# Patient Record
Sex: Male | Born: 1940 | Race: White | Hispanic: No | Marital: Single | Smoking: Former smoker
Health system: Southern US, Community
[De-identification: ages and names within clinical notes are randomized; demographics above are authoritative.]

## PROBLEM LIST (undated history)

## (undated) DIAGNOSIS — M169 Osteoarthritis of hip, unspecified: Secondary | ICD-10-CM

## (undated) DIAGNOSIS — M179 Osteoarthritis of knee, unspecified: Secondary | ICD-10-CM

## (undated) DIAGNOSIS — K219 Gastro-esophageal reflux disease without esophagitis: Secondary | ICD-10-CM

## (undated) DIAGNOSIS — Z9289 Personal history of other medical treatment: Secondary | ICD-10-CM

## (undated) DIAGNOSIS — M1610 Unilateral primary osteoarthritis, unspecified hip: Secondary | ICD-10-CM

## (undated) DIAGNOSIS — N183 Chronic kidney disease, stage 3 unspecified: Secondary | ICD-10-CM

## (undated) DIAGNOSIS — M1710 Unilateral primary osteoarthritis, unspecified knee: Secondary | ICD-10-CM

## (undated) DIAGNOSIS — N400 Enlarged prostate without lower urinary tract symptoms: Secondary | ICD-10-CM

## (undated) DIAGNOSIS — I10 Essential (primary) hypertension: Secondary | ICD-10-CM

## (undated) DIAGNOSIS — K812 Acute cholecystitis with chronic cholecystitis: Secondary | ICD-10-CM

## (undated) DIAGNOSIS — I2510 Atherosclerotic heart disease of native coronary artery without angina pectoris: Secondary | ICD-10-CM

## (undated) DIAGNOSIS — J449 Chronic obstructive pulmonary disease, unspecified: Secondary | ICD-10-CM

## (undated) DIAGNOSIS — G43909 Migraine, unspecified, not intractable, without status migrainosus: Secondary | ICD-10-CM

## (undated) DIAGNOSIS — N259 Disorder resulting from impaired renal tubular function, unspecified: Secondary | ICD-10-CM

## (undated) HISTORY — DX: Atherosclerotic heart disease of native coronary artery without angina pectoris: I25.10

## (undated) HISTORY — DX: Disorder resulting from impaired renal tubular function, unspecified: N25.9

## (undated) HISTORY — DX: Personal history of other medical treatment: Z92.89

## (undated) HISTORY — DX: Benign prostatic hyperplasia without lower urinary tract symptoms: N40.0

## (undated) HISTORY — DX: Osteoarthritis of hip, unspecified: M16.9

## (undated) HISTORY — DX: Migraine, unspecified, not intractable, without status migrainosus: G43.909

## (undated) HISTORY — DX: Chronic obstructive pulmonary disease, unspecified: J44.9

## (undated) HISTORY — DX: Essential (primary) hypertension: I10

## (undated) HISTORY — DX: Gastro-esophageal reflux disease without esophagitis: K21.9

## (undated) HISTORY — PX: TONSILLECTOMY: SHX5217

## (undated) HISTORY — DX: Chronic kidney disease, stage 3 (moderate): N18.3

## (undated) HISTORY — DX: Unilateral primary osteoarthritis, unspecified hip: M16.10

## (undated) HISTORY — DX: Acute cholecystitis with chronic cholecystitis: K81.2

## (undated) HISTORY — DX: Chronic kidney disease, stage 3 unspecified: N18.30

## (undated) HISTORY — DX: Unilateral primary osteoarthritis, unspecified knee: M17.10

## (undated) HISTORY — DX: Osteoarthritis of knee, unspecified: M17.9

## (undated) SURGERY — LAPAROSCOPIC CHOLECYSTECTOMY WITH INTRAOPERATIVE CHOLANGIOGRAM
Anesthesia: General

---
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
## 2008-11-10 ENCOUNTER — Encounter: Payer: Self-pay | Admitting: Internal Medicine

## 2008-11-10 LAB — CONVERTED CEMR LAB
ALT: 19 units/L
AST: 18 units/L
BUN: 19 mg/dL
Basophils Relative: 1 %
Calcium: 9.6 mg/dL
Chloride: 104 meq/L
Glucose, Bld: 94 mg/dL
HCT: 49.8 %
Hemoglobin: 17 g/dL
MCV: 88.1 fL
Platelets: 197 10*3/uL
Potassium: 4.5 meq/L
RDW: 14.3 %

## 2008-11-16 ENCOUNTER — Inpatient Hospital Stay (HOSPITAL_COMMUNITY): Admission: RE | Admit: 2008-11-16 | Discharge: 2008-11-21 | Payer: Self-pay | Admitting: Orthopedic Surgery

## 2008-11-16 DIAGNOSIS — Z96659 Presence of unspecified artificial knee joint: Secondary | ICD-10-CM | POA: Insufficient documentation

## 2008-11-19 ENCOUNTER — Encounter: Payer: Self-pay | Admitting: Internal Medicine

## 2008-11-19 HISTORY — PX: TOTAL HIP ARTHROPLASTY: SHX124

## 2008-11-19 LAB — CONVERTED CEMR LAB
BUN: 35 mg/dL
Glucose, Bld: 128 mg/dL
MCV: 88.6 fL
Platelets: 167 10*3/uL
Potassium: 5.1 meq/L
RDW: 14.5 %

## 2009-07-07 ENCOUNTER — Ambulatory Visit: Payer: Self-pay | Admitting: Internal Medicine

## 2009-07-07 DIAGNOSIS — G43909 Migraine, unspecified, not intractable, without status migrainosus: Secondary | ICD-10-CM | POA: Insufficient documentation

## 2009-07-07 DIAGNOSIS — M169 Osteoarthritis of hip, unspecified: Secondary | ICD-10-CM | POA: Insufficient documentation

## 2009-07-07 DIAGNOSIS — Z87442 Personal history of urinary calculi: Secondary | ICD-10-CM | POA: Insufficient documentation

## 2009-07-07 DIAGNOSIS — J189 Pneumonia, unspecified organism: Secondary | ICD-10-CM | POA: Insufficient documentation

## 2009-07-07 DIAGNOSIS — R519 Headache, unspecified: Secondary | ICD-10-CM | POA: Insufficient documentation

## 2009-07-07 DIAGNOSIS — K219 Gastro-esophageal reflux disease without esophagitis: Secondary | ICD-10-CM | POA: Insufficient documentation

## 2009-07-07 DIAGNOSIS — M129 Arthropathy, unspecified: Secondary | ICD-10-CM | POA: Insufficient documentation

## 2009-07-07 DIAGNOSIS — M1610 Unilateral primary osteoarthritis, unspecified hip: Secondary | ICD-10-CM | POA: Insufficient documentation

## 2009-07-07 DIAGNOSIS — I10 Essential (primary) hypertension: Secondary | ICD-10-CM | POA: Insufficient documentation

## 2009-07-07 DIAGNOSIS — N259 Disorder resulting from impaired renal tubular function, unspecified: Secondary | ICD-10-CM | POA: Insufficient documentation

## 2009-07-07 DIAGNOSIS — N400 Enlarged prostate without lower urinary tract symptoms: Secondary | ICD-10-CM | POA: Insufficient documentation

## 2009-07-07 DIAGNOSIS — R51 Headache: Secondary | ICD-10-CM | POA: Insufficient documentation

## 2009-07-07 LAB — CONVERTED CEMR LAB
ALT: 13 units/L (ref 0–53)
AST: 16 units/L (ref 0–37)
Alkaline Phosphatase: 48 units/L (ref 39–117)
BUN: 25 mg/dL — ABNORMAL HIGH (ref 6–23)
Basophils Absolute: 0.1 10*3/uL (ref 0.0–0.1)
Basophils Relative: 0.7 % (ref 0.0–3.0)
Chloride: 108 meq/L (ref 96–112)
Eosinophils Absolute: 0.4 10*3/uL (ref 0.0–0.7)
Glucose, Bld: 85 mg/dL (ref 70–99)
HCT: 45.3 % (ref 39.0–52.0)
Lymphs Abs: 3 10*3/uL (ref 0.7–4.0)
MCHC: 33.4 g/dL (ref 30.0–36.0)
MCV: 93.7 fL (ref 78.0–100.0)
Monocytes Relative: 6.1 % (ref 3.0–12.0)
Neutro Abs: 3.6 10*3/uL (ref 1.4–7.7)
Platelets: 189 10*3/uL (ref 150.0–400.0)
RDW: 13.3 % (ref 11.5–14.6)
Sodium: 140 meq/L (ref 135–145)
Total Protein: 6.5 g/dL (ref 6.0–8.3)

## 2009-07-22 HISTORY — PX: REPLACEMENT TOTAL KNEE BILATERAL: SUR1225

## 2009-08-10 ENCOUNTER — Encounter: Payer: Self-pay | Admitting: Internal Medicine

## 2009-08-16 ENCOUNTER — Inpatient Hospital Stay (HOSPITAL_COMMUNITY): Admission: RE | Admit: 2009-08-16 | Discharge: 2009-08-18 | Payer: Self-pay | Admitting: Orthopedic Surgery

## 2009-08-16 ENCOUNTER — Encounter: Payer: Self-pay | Admitting: Internal Medicine

## 2009-08-31 ENCOUNTER — Encounter: Payer: Self-pay | Admitting: Internal Medicine

## 2009-09-21 ENCOUNTER — Encounter: Payer: Self-pay | Admitting: Internal Medicine

## 2009-11-02 ENCOUNTER — Encounter: Payer: Self-pay | Admitting: Internal Medicine

## 2009-11-03 ENCOUNTER — Encounter: Payer: Self-pay | Admitting: Internal Medicine

## 2009-11-03 ENCOUNTER — Inpatient Hospital Stay (HOSPITAL_COMMUNITY): Admission: RE | Admit: 2009-11-03 | Discharge: 2009-11-06 | Payer: Self-pay | Admitting: Orthopedic Surgery

## 2009-11-20 ENCOUNTER — Encounter: Payer: Self-pay | Admitting: Internal Medicine

## 2010-02-06 ENCOUNTER — Encounter: Payer: Self-pay | Admitting: Internal Medicine

## 2010-03-27 ENCOUNTER — Ambulatory Visit: Payer: Self-pay | Admitting: Internal Medicine

## 2010-03-27 DIAGNOSIS — R509 Fever, unspecified: Secondary | ICD-10-CM | POA: Insufficient documentation

## 2010-03-27 LAB — CONVERTED CEMR LAB
Basophils Absolute: 0.1 10*3/uL (ref 0.0–0.1)
Eosinophils Relative: 3.1 % (ref 0.0–5.0)
HCT: 45 % (ref 39.0–52.0)
Hemoglobin: 15.2 g/dL (ref 13.0–17.0)
Leukocytes, UA: NEGATIVE
Lymphocytes Relative: 20.2 % (ref 12.0–46.0)
Monocytes Relative: 5.6 % (ref 3.0–12.0)
Neutrophils Relative %: 69.8 % (ref 43.0–77.0)
Sed Rate: 12 mm/hr (ref 0–22)
Specific Gravity, Urine: >=1.03 (ref 1.000–1.030)
WBC: 10 10*3/uL (ref 4.5–10.5)
pH: 5 (ref 5.0–8.0)

## 2010-08-21 NOTE — Letter (Signed)
Summary: [facility]  [facility]   Imported By: Lennie Odor 02/12/2010 14:50:04  _____________________________________________________________________  External Attachment:    Type:   Image     Comment:   External Document

## 2010-08-21 NOTE — Letter (Signed)
Summary: [facility]  [facility]   Imported By: Sherian Rein 11/27/2009 09:51:37  _____________________________________________________________________  External Attachment:    Type:   Image     Comment:   External Document

## 2010-08-21 NOTE — Letter (Signed)
Summary: [facility]  [facility]   Imported By: Lester [place] 09/09/2009 08:16:50  _____________________________________________________________________  External Attachment:    Type:   Image     Comment:   External Document

## 2010-08-21 NOTE — Letter (Signed)
Summary: [facility]  [facility]   Imported By: Sherian Rein 09/29/2009 07:29:18  _____________________________________________________________________  External Attachment:    Type:   Image     Comment:   External Document

## 2010-08-21 NOTE — Op Note (Signed)
Summary: [facility]  [facility]   Imported By: Lester Curtis 09/09/2009 08:15:45  _____________________________________________________________________  External Attachment:    Type:   Image     Comment:   External Document

## 2010-08-21 NOTE — Letter (Signed)
Summary: [facility]   Imported By: Sherian Rein 08/18/2009 11:59:54  _____________________________________________________________________  External Attachment:    Type:   Image     Comment:   External Document

## 2010-08-21 NOTE — Letter (Signed)
Summary: [facility]  [facility]   Imported By: Lester [place] 11/15/2009 10:55:16  _____________________________________________________________________  External Attachment:    Type:   Image     Comment:   External Document

## 2010-08-21 NOTE — Letter (Signed)
Summary: [facility]  [facility]   Imported By: Sherian Rein 11/27/2009 09:52:42  _____________________________________________________________________  External Attachment:    Type:   Image     Comment:   External Document

## 2010-08-21 NOTE — Assessment & Plan Note (Signed)
Summary: fever over the weekend/congestion/cd   Vital Signs:  Patient profile:   70 year old male Height:      72 inches Weight:      216 pounds O2 Sat:      97 % Temp:     99.9 degrees F oral Pulse rate:   86 / minute Pulse rhythm:   regular BP sitting:   115 / 60  (left arm) Cuff size:   large  Vitals Entered By: patty berrocal/intern CC: fever, congestion Is Patient Diabetic? No Pain Assessment Patient in pain? no      Comments pt currently taking antibotic prescribed by his dentist unsure of name. has 4-5 days left   Primary Care Provider:  Newt Lukes MD  CC:  fever and congestion.  History of Present Illness: here with c/o fever onset 4 days ago -  assoc with chest congestion, hoarseness and dry cough + night sweats no weight changes, HA or rash -  symptoms improved since starting abx from dentist for sore on gums wants to be sure no "bacterial infx in his joints from surgery"  reviewed other chronic med issues:   HTN  -  reports compliance with ongoing medical treatment and no changes in medication dose or frequency. denies adverse side effects related to current therapy.   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 1 Puff Two Times A Day 2)  Coumadin 5 Mg Tabs (Warfarin Sodium) .... Use As Directed 3)  Ferrous Sulfate 325 (65 Fe) Mg Tbec (Ferrous Sulfate) .... Take 1 Two Times A Day 4)  Tylenol Extra Strength 500 Mg Tabs (Acetaminophen) .... Use As Needed 5)  Ambien 5 Mg Tabs (Zolpidem Tartrate) .... Use As Needed 6)  Methocarbamol 500 Mg Tabs (Methocarbamol) .... Take 1 Q 6 Hours As Needed For Spasm 7)  Lisinopril-Hydrochlorothiazide 20-25  Mg Tabs (Lisinopril-Hydrochlorothiazide) .... Take 1 By Mouth Qd 8)  Meloxicam 15 Mg Tabs (Meloxicam) .... Take 1 By Mouth Qd 9)  Tramadol Hcl 50 Mg Tabs (Tramadol Hcl) .... Take 1-2 Q 6 Hours Prn 10)  Aspirin 325 Mg Tabs (Aspirin) .... Take 1 By Mouth Qd 11)  Ibuprofen 200 Mg Tabs (Ibuprofen) .... Take As Needed 12)  Flaxseed Oil  Oil (Flaxseed (Linseed)) .... Two Times A Day 13)  Oxycodone-Acetaminophen 5-325 Mg Tabs (Oxycodone-Acetaminophen) .... As Needed 14)  Hydrocodone-Acetaminophen 5-500 Mg Tabs (Hydrocodone-Acetaminophen) .... Prn  Allergies (verified): No Known Drug Allergies  Past History:  Past Medical History: Hypertension Benign prostatic hypertrophy Renal insufficiency GERD kidney stones, hx  MD roster: ortho - [facility]  Review of Systems       The patient complains of fever and hoarseness.  The patient denies chest pain, headaches, and hemoptysis.         c/o myalgias and fatigue - sleeping 15-16h/d x 3 days  Physical Exam  General:  overweight-appearing.  alert, well-developed, well-nourished, and cooperative to examination.    Eyes:  vision grossly intact; pupils equal, round and reactive to light.  conjunctiva and lids normal.   wears glasses Ears:  normal pinnae bilaterally, without erythema, swelling, or tenderness to palpation. TMs clear, without effusion, or  cerumen impaction. Hearing grossly normal bilaterally with hearing aides Mouth:  teeth and gums in good repair; mucous membranes moist, without lesions or ulcers. oropharynx clear without exudate, mild erythema.  Lungs:  normal respiratory effort, no intercostal retractions or use of accessory muscles; normal breath sounds bilaterally - no crackles and no wheezes.    Heart:  normal rate, regular rhythm, no murmur, and no rub. BLE without edema.  Msk:  no effusions or redness of B kness, approp warmth to touch Skin:  no rashes, vesicles, ulcers, or erythema. No nodules or irregularity to palpation.     Impression & Recommendations:  Problem # 1:  FEVER UNSPECIFIED (ICD-780.60)  suspect viral infx notes partial response to abx tx from dentist  -?amox-  call drug store to verify abx - then plan change to broader sectrum coverage given LGF check labs now and CXR -   Orders: TLB-CBC Platelet - w/Differential (85025-CBCD) TLB-Sedimentation Rate (ESR) (85652-ESR) TLB-Udip w/ Micro (81001-URINE) T-2 View CXR (71020TC)  Problem # 2:  ARTHRITIS (ICD-716.90)  s/p bilat TKR 07/2009 - also R THR hx  Problem # 3:  HYPERTENSION, BORDERLINE (ICD-401.9)  His updated medication list for this problem includes:    Lisinopril-hydrochlorothiazide 20-25 Mg Tabs (Lisinopril-hydrochlorothiazide) .Marland Kitchen... Take 1 by mouth qd  BP today: 115/60 Prior BP: 120/76 (07/07/2009)  Labs Reviewed: K+: 5.3 (07/07/2009) Creat: : 2.0 (07/07/2009)     Complete Medication List: 1)  Advair Diskus 250-50 Mcg/dose Aepb (Fluticasone-salmeterol) .Marland Kitchen.. 1 puff two times a day 2)  Coumadin 5 Mg Tabs (Warfarin sodium) .... Use as directed 3)  Ferrous Sulfate 325 (65 Fe) Mg Tbec (Ferrous sulfate) .... Take 1 two times a day 4)  Tylenol Extra Strength 500 Mg Tabs (Acetaminophen) .... Use as needed 5)  Ambien 5 Mg Tabs (Zolpidem tartrate) .... Use as needed 6)  Methocarbamol 500 Mg Tabs (Methocarbamol) .... Take 1 q 6 hours as needed for spasm 7)  Lisinopril-hydrochlorothiazide 20-25 Mg Tabs (Lisinopril-hydrochlorothiazide) .... Take 1 by mouth qd 8)  Meloxicam 15 Mg Tabs (Meloxicam) .... Take 1 by mouth qd 9)  Tramadol Hcl 50 Mg Tabs (Tramadol hcl) .... Take 1-2 q 6 hours prn 10)  Aspirin 325 Mg Tabs (Aspirin) .... Take 1 by mouth qd 11)  Ibuprofen 200 Mg Tabs (Ibuprofen) .... Take as needed 12)  Flaxseed Oil Oil (Flaxseed (linseed)) .... Two times a day 13)  Oxycodone-acetaminophen 5-325 Mg Tabs (Oxycodone-acetaminophen) .... As needed 14)  Hydrocodone-acetaminophen 5-500 Mg Tabs (Hydrocodone-acetaminophen) ....  Prn 15)  Levaquin 500 Mg Tabs (Levofloxacin) .Marland Kitchen.. 1 by mouth once daily x 7 days  Patient Instructions: 1)  it was good to see you today. 2)  test(s) ordered today - your results will be posted on the phone tree for review in 48-72 hours from the time of test completion; call [phone] and enter your 9 digit MRN (listed above on this page, just below your name); if any changes need to be made or there are abnormal results, you will be contacted directly. 3)  will change your antibiotic to braoder coverage - suspect Levaquin will be prescribed after i review current treatment with your pharmacy Prescriptions: LEVAQUIN 500 MG TABS (LEVOFLOXACIN) 1 by mouth once daily x 7 days  #7 x 0   Entered and Authorized by:   Newt Lukes MD   Signed by:   Newt Lukes MD on 03/27/2010   Method used:   Electronically to        [id number] (retail)       [address]       [place], [place]  [address]       Ph: [phone]       Fax: [phone]   RxID:   [id number]

## 2010-10-07 LAB — CBC
HCT: 51.6 % (ref 39.0–52.0)
Hemoglobin: 12.9 g/dL — ABNORMAL LOW (ref 13.0–17.0)
Hemoglobin: 17.2 g/dL — ABNORMAL HIGH (ref 13.0–17.0)
MCHC: 33.4 g/dL (ref 30.0–36.0)
MCV: 92.6 fL (ref 78.0–100.0)
MCV: 93.1 fL (ref 78.0–100.0)
RBC: 3.88 MIL/uL — ABNORMAL LOW (ref 4.22–5.81)
RBC: 4.05 MIL/uL — ABNORMAL LOW (ref 4.22–5.81)
RDW: 13.6 % (ref 11.5–15.5)
WBC: 10.9 10*3/uL — ABNORMAL HIGH (ref 4.0–10.5)
WBC: 8.3 10*3/uL (ref 4.0–10.5)

## 2010-10-07 LAB — BASIC METABOLIC PANEL
BUN: 23 mg/dL (ref 6–23)
Calcium: 8.3 mg/dL — ABNORMAL LOW (ref 8.4–10.5)
Chloride: 103 mEq/L (ref 96–112)
Creatinine, Ser: 1.62 mg/dL — ABNORMAL HIGH (ref 0.4–1.5)
GFR calc Af Amer: 47 mL/min — ABNORMAL LOW (ref >60)
GFR calc non Af Amer: 39 mL/min — ABNORMAL LOW (ref >60)
Glucose, Bld: 95 mg/dL (ref 70–99)
Potassium: 3.8 mEq/L (ref 3.5–5.1)
Potassium: 4.2 mEq/L (ref 3.5–5.1)
Sodium: 136 mEq/L (ref 135–145)

## 2010-10-07 LAB — URINALYSIS, ROUTINE W REFLEX MICROSCOPIC
Bilirubin Urine: NEGATIVE
Glucose, UA: NEGATIVE mg/dL
Ketones, ur: NEGATIVE mg/dL
Protein, ur: NEGATIVE mg/dL

## 2010-10-07 LAB — DIFFERENTIAL
Basophils Relative: 1 % (ref 0–1)
Eosinophils Absolute: 0.3 10*3/uL (ref 0.0–0.7)
Eosinophils Relative: 3 % (ref 0–5)

## 2010-10-07 LAB — COMPREHENSIVE METABOLIC PANEL
ALT: 17 U/L (ref 0–53)
Alkaline Phosphatase: 57 U/L (ref 39–117)
CO2: 27 mEq/L (ref 19–32)
Chloride: 105 mEq/L (ref 96–112)
Glucose, Bld: 84 mg/dL (ref 70–99)
Potassium: 5.1 mEq/L (ref 3.5–5.1)
Sodium: 139 mEq/L (ref 135–145)

## 2010-10-07 LAB — PROTIME-INR
INR: 0.99 (ref 0.00–1.49)
Prothrombin Time: 15 seconds (ref 11.6–15.2)

## 2010-10-07 LAB — TYPE AND SCREEN: Antibody Screen: NEGATIVE

## 2010-10-09 LAB — CBC
HCT: 34 % — ABNORMAL LOW (ref 39.0–52.0)
HCT: 35.2 % — ABNORMAL LOW (ref 39.0–52.0)
Hemoglobin: 11.3 g/dL — ABNORMAL LOW (ref 13.0–17.0)
Hemoglobin: 11.8 g/dL — ABNORMAL LOW (ref 13.0–17.0)
Hemoglobin: 12.2 g/dL — ABNORMAL LOW (ref 13.0–17.0)
Platelets: 146 10*3/uL — ABNORMAL LOW (ref 150–400)
RBC: 3.68 MIL/uL — ABNORMAL LOW (ref 4.22–5.81)
RBC: 3.77 MIL/uL — ABNORMAL LOW (ref 4.22–5.81)
RDW: 15.2 % (ref 11.5–15.5)
WBC: 11.3 10*3/uL — ABNORMAL HIGH (ref 4.0–10.5)
WBC: 11.6 10*3/uL — ABNORMAL HIGH (ref 4.0–10.5)
WBC: 8.1 10*3/uL (ref 4.0–10.5)

## 2010-10-09 LAB — BASIC METABOLIC PANEL
CO2: 28 mEq/L (ref 19–32)
Calcium: 8.5 mg/dL (ref 8.4–10.5)
Calcium: 9 mg/dL (ref 8.4–10.5)
Creatinine, Ser: 1.7 mg/dL — ABNORMAL HIGH (ref 0.4–1.5)
GFR calc Af Amer: 49 mL/min — ABNORMAL LOW (ref >60)
GFR calc Af Amer: 49 mL/min — ABNORMAL LOW (ref >60)
GFR calc non Af Amer: 40 mL/min — ABNORMAL LOW (ref >60)
Sodium: 136 mEq/L (ref 135–145)
Sodium: 138 mEq/L (ref 135–145)

## 2010-10-09 LAB — TYPE AND SCREEN: ABO/RH(D): O POS

## 2010-10-09 LAB — PROTIME-INR
INR: 1.21 (ref 0.00–1.49)
INR: 1.28 (ref 0.00–1.49)
INR: 1.52 — ABNORMAL HIGH (ref 0.00–1.49)
Prothrombin Time: 15.2 seconds (ref 11.6–15.2)

## 2010-10-10 LAB — COMPREHENSIVE METABOLIC PANEL
ALT: 13 U/L (ref 0–53)
Alkaline Phosphatase: 55 U/L (ref 39–117)
Chloride: 105 mEq/L (ref 96–112)
Glucose, Bld: 127 mg/dL — ABNORMAL HIGH (ref 70–99)
Potassium: 5 mEq/L (ref 3.5–5.1)
Sodium: 144 mEq/L (ref 135–145)
Total Bilirubin: 0.5 mg/dL (ref 0.3–1.2)
Total Protein: 6.6 g/dL (ref 6.0–8.3)

## 2010-10-10 LAB — DIFFERENTIAL
Basophils Relative: 1 % (ref 0–1)
Eosinophils Absolute: 0.4 10*3/uL (ref 0.0–0.7)
Monocytes Absolute: 0.4 10*3/uL (ref 0.1–1.0)
Monocytes Relative: 5 % (ref 3–12)
Neutrophils Relative %: 53 % (ref 43–77)

## 2010-10-10 LAB — CBC
Hemoglobin: 15.2 g/dL (ref 13.0–17.0)
RBC: 4.98 MIL/uL (ref 4.22–5.81)
RDW: 15.2 % (ref 11.5–15.5)
WBC: 7.3 10*3/uL (ref 4.0–10.5)

## 2010-10-10 LAB — URINALYSIS, ROUTINE W REFLEX MICROSCOPIC
Glucose, UA: NEGATIVE mg/dL
Ketones, ur: NEGATIVE mg/dL
Nitrite: NEGATIVE
Specific Gravity, Urine: 1.026 (ref 1.005–1.030)
pH: 5 (ref 5.0–8.0)

## 2010-10-10 LAB — PROTIME-INR: INR: 1.01 (ref 0.00–1.49)

## 2010-10-30 LAB — CBC
HCT: 34.1 % — ABNORMAL LOW (ref 39.0–52.0)
MCHC: 34.1 g/dL (ref 30.0–36.0)
MCV: 89.5 fL (ref 78.0–100.0)
Platelets: 133 10*3/uL — ABNORMAL LOW (ref 150–400)
RDW: 14.4 % (ref 11.5–15.5)

## 2010-10-30 LAB — BASIC METABOLIC PANEL
BUN: 18 mg/dL (ref 6–23)
Chloride: 103 mEq/L (ref 96–112)
Glucose, Bld: 114 mg/dL — ABNORMAL HIGH (ref 70–99)
Potassium: 4.5 mEq/L (ref 3.5–5.1)

## 2010-10-31 LAB — COMPREHENSIVE METABOLIC PANEL
AST: 18 U/L (ref 0–37)
BUN: 19 mg/dL (ref 6–23)
CO2: 26 mEq/L (ref 19–32)
Chloride: 104 mEq/L (ref 96–112)
Creatinine, Ser: 1.44 mg/dL (ref 0.4–1.5)
GFR calc Af Amer: 59 mL/min — ABNORMAL LOW (ref >60)
GFR calc non Af Amer: 49 mL/min — ABNORMAL LOW (ref >60)
Total Bilirubin: 1 mg/dL (ref 0.3–1.2)

## 2010-10-31 LAB — URINALYSIS, ROUTINE W REFLEX MICROSCOPIC
Bilirubin Urine: NEGATIVE
Glucose, UA: NEGATIVE mg/dL
Hgb urine dipstick: NEGATIVE
Ketones, ur: NEGATIVE mg/dL
Protein, ur: NEGATIVE mg/dL

## 2010-10-31 LAB — CBC
HCT: 33.6 % — ABNORMAL LOW (ref 39.0–52.0)
HCT: 36.3 % — ABNORMAL LOW (ref 39.0–52.0)
HCT: 49.8 % (ref 39.0–52.0)
Hemoglobin: 11.7 g/dL — ABNORMAL LOW (ref 13.0–17.0)
Hemoglobin: 12.5 g/dL — ABNORMAL LOW (ref 13.0–17.0)
MCHC: 34.9 g/dL (ref 30.0–36.0)
MCV: 88.1 fL (ref 78.0–100.0)
MCV: 88.6 fL (ref 78.0–100.0)
MCV: 89 fL (ref 78.0–100.0)
Platelets: 167 10*3/uL (ref 150–400)
RBC: 3.78 MIL/uL — ABNORMAL LOW (ref 4.22–5.81)
RBC: 4.1 MIL/uL — ABNORMAL LOW (ref 4.22–5.81)
RBC: 5.65 MIL/uL (ref 4.22–5.81)
RDW: 14.2 % (ref 11.5–15.5)
WBC: 13.4 10*3/uL — ABNORMAL HIGH (ref 4.0–10.5)
WBC: 9.1 10*3/uL (ref 4.0–10.5)

## 2010-10-31 LAB — ABO/RH: ABO/RH(D): O POS

## 2010-10-31 LAB — BASIC METABOLIC PANEL
BUN: 35 mg/dL — ABNORMAL HIGH (ref 6–23)
CO2: 28 mEq/L (ref 19–32)
Chloride: 100 mEq/L (ref 96–112)
Chloride: 105 mEq/L (ref 96–112)
GFR calc Af Amer: 55 mL/min — ABNORMAL LOW (ref >60)
Glucose, Bld: 117 mg/dL — ABNORMAL HIGH (ref 70–99)
Potassium: 4.3 mEq/L (ref 3.5–5.1)
Potassium: 5.1 mEq/L (ref 3.5–5.1)
Sodium: 136 mEq/L (ref 135–145)
Sodium: 137 mEq/L (ref 135–145)

## 2010-10-31 LAB — DIFFERENTIAL
Basophils Absolute: 0.1 10*3/uL (ref 0.0–0.1)
Basophils Relative: 1 % (ref 0–1)
Eosinophils Relative: 4 % (ref 0–5)
Lymphocytes Relative: 32 % (ref 12–46)

## 2010-10-31 LAB — PROTIME-INR: Prothrombin Time: 14.1 seconds (ref 11.6–15.2)

## 2010-11-05 ENCOUNTER — Other Ambulatory Visit: Payer: Self-pay | Admitting: Internal Medicine

## 2010-12-04 NOTE — Discharge Summary (Signed)
NAME:  Preston Boyd, BULMAN NO.:  [id number]   MEDICAL RECORD NO.:  [id number]          PATIENT TYPE:  INP   LOCATION:  5019                         FACILITY:  [facility]   PHYSICIAN:  Harvie Junior, M.D.   DATE OF BIRTH:  [birth year]   DATE OF ADMISSION:  11/16/2008  DATE OF DISCHARGE:  11/21/2008                               DISCHARGE SUMMARY   ADMISSION DIAGNOSIS:  1. End-stage degenerative joint disease, right hip.  2. History of kidney stones.  3. History of pneumonia, 07/2007 with residual intermittent shortness      of breath.  4. Benign prostatic hypertrophy.  5. Borderline hypertension.   DISCHARGE DIAGNOSIS:  1. End-stage degenerative joint disease, right hip.  2. History of kidney stones.  3. History of pneumonia, 07/2007 with residual intermittent shortness      of breath.  4. Benign prostatic hypertrophy.  5. Borderline hypertension.  6. Renal insufficiency.   PROCEDURES:  Right total hip arthroplasty, Jodi Geralds, M.D.   CONSULTATIONS:  None.   BRIEF HISTORY:  Mr. Preston Boyd is a 70 year old male who has a long history  of right hip pain.  X-rays show end-stage degenerative joint disease of  the right hip.  He got no relief with conservative treatment including  modification of his activity and medication and despite this continued  to have pain with ambulation and night pain.  He is admitted for right  total hip arthroplasty as he failed exhaustive conservative treatment.   PERTINENT LABORATORY STUDIES:  Chest x-ray on November 10, 2008 showed no  active lung disease.  EKG on November 10, 2008 showed normal sinus rhythm  with nonspecific T-wave abnormality, abnormal EKG.  The patient's  hemoglobin on postop day number one was 12.5, hematocrit 36.3.  This was  followed on a daily basis and on Nov 19, 2008, his hemoglobin was 11.6  with hematocrit of 34.1.  His BMET showed an elevated creatinine of 2.10  on postop day number one with a BUN of 35.  On postop  day number two,  his creatinine was 1.54 with a BUN of 28.  On postop day number three,  May 1,2010, his creatinine was 1.36 with a BUN of 18.  His pro time on  admission was 15.8 seconds with an INR of 1.2.  On the day of discharge,  Nov 21, 2008, his pro time was 25.7 seconds and INR 2.2.  X-ray  postoperatively of the pelvis and right hip showed good position of his  total hip prosthesis without complications.   HOSPITAL COURSE:  The patient was admitted to the hospital and brought  to the operating room where he underwent right total hip arthroplasty  that is well described in Dr. Luiz Blare' operative note.  Preoperatively,  he was given a gram of Ancef and gentamicin.  Postoperatively, he was  given 24 hours of IV Ancef.  A PCA morphine pump was used for pain  control.  Physical therapy got him up walking, weightbearing as  tolerated on the right with hip precautions, ambulating with a walker.  A Foley catheter that had been placed  at the time of surgery was intact.  He had a temperature of 100.  Blood pressure was slightly decreased at  90/60, pulse 97, respirations 16.  He was given a couple of rounds of  500 cc of fluid boluses.  His hemoglobin was 12.5.  His INR was 1.2 and  his creatinine was 2.1 with a BUN of 35.  He was also put on Coumadin  for DVT prophylaxis.  On postop day number two, he was feeling better  but was unable to void.  His BUN and creatinine started drifting  downward.  He was continued on IV fluids to really hydrate him to  address his renal issues.  His PCA pump was discontinued and he was  continued on oral Coumadin and daily physical therapy.  On postop day  number three, his pain was reasonable.  He was getting out of bed to the  chair.  His dressing was changed.  His IV was discontinued.  His Foley  catheter was discontinued on postop day number four.  He was voiding  without difficulty and he had a BM without difficulty.  On the day of  discharge, he was  feeling good.  He was walking with physical therapy in  the hall, weightbearing as tolerated on the right.  He was taking fluids  and voiding without difficulty.  He had two BMs.  His vital signs were  stable.  He was afebrile.  His INR was 2.2.  Right hip dressing was  clean and dry and NV was intact to the right lower extremity.  He was  discharged to a skilled nursing facility in improved condition.  He will  be on a regular diet.  Activity status will be weightbearing as  tolerated on the right, doing posterior hip precautions.  He will need  daily physical therapy.  This will be walker ambulation, weightbearing  as tolerated on the right.   DISCHARGE MEDICATIONS:  1. Advair Diskus one puff b.i.d.  2. Lisinopril 40 mg one daily.  3. HCTZ 25 mg one daily.  4. Coumadin per pharmacy protocol one daily, 5 mg daily.  5. Colace 100 mg b.i.d.  6. Ferrous sulfate 325 mg b.i.d.  7. Tylenol one to two tablets q.4 h. p.r.n. mild pain.  8. Ambien 5 mg p.r.n. sleep.  9. Methocarbamol 500 mg one tablet q.6 h. p.r.n. spasm.  10.Percocet 5 mg one to two tablets q.4-6 h. p.r.n. pain.   He will need pro times per pharmacy for Coumadin management times one  month postop for DVT prophylaxis.  We would like the INR to be between  1.5 and 2.0.  He will need his right hip dressing changed every third  day.  He will need to see Dr. Luiz Blare in the office for staple removal  from the right hip and for follow-up x-ray in 10 days.  Our office  number is [phone].      Marshia Ly, P.A.      Harvie Junior, M.D.  Electronically Signed    JB/MEDQ  D:  11/21/2008  T:  11/21/2008  Job:  [id number]

## 2010-12-04 NOTE — Op Note (Signed)
NAME:  Preston Boyd, FLORI NO.:  [id number]   MEDICAL RECORD NO.:  [id number]          PATIENT TYPE:  INP   LOCATION:  2899                         FACILITY:  [facility]   PHYSICIAN:  Harvie Junior, M.D.   DATE OF BIRTH:  01-01-1941   DATE OF PROCEDURE:  11/16/2008  DATE OF DISCHARGE:                               OPERATIVE REPORT   PREOPERATIVE DIAGNOSIS:  End-stage degenerative joint disease, right  hip.   POSTOPERATIVE DIAGNOSIS:  End-stage degenerative joint disease, right  hip.   PROCEDURE:  Right total hip replacement with an S-ROM system using a 20  x 15 stem with a 36 +12 offset, a 20 D large cone of 40 mm +0 S-ROM  head, 56 x 40 metal liner, and a 56-mm Pinnacle cup with a hole  eliminator.   SURGEON:  Harvie Junior, MD   ASSISTANT:  Marshia Ly, PA   ANESTHESIA:  General.   BRIEF HISTORY:  Preston Boyd is a 70 year old male with long history of  having had significant severe pain in the right hip and groin area.  We  evaluated him in the office and took x-rays that showed that he had  severe end-stage degenerative joint disease of the right hip.  We talked  about treatment options and felt the right total hip replacement is the  most perfect course of action.  He was brought to the operating for this  procedure.   PROCEDURE IN DETAIL:  The patient was brought to the operating room  after adequate antibiotic prophylaxis had been given.  The patient was  placed supine on the operating table and then moved in the left lateral  decubitus position.  All bony problems well padded and an axillary roll  was put in place.  Attention was then turned to the right hip where  after routine prep and drape, the patient underwent a posterior approach  to the hip.  Subcutaneous tissues down to the level of the tensor fascia  divided in line with its fibers.  A Charnley retractor was put in place  anterior and posterior into the hip.  The short external rotators and  piriformis were then taken down along with the posterior capsule in a  layer and access was then got into the hip.  Dislocation was undertaken  and a preliminary trial neck cut was made.  Following this, attention  was turned towards the acetabulum which was sequentially reamed to a  level of 55 after deepening a good evidence of bone and then at 45  degrees of lateral opening and 30 degrees of anteversion, a cup was  hammered into place, care being taken to assess those angles.  Once that  was completed, a trial liner was put in place and because of a 56 cup,  that did allow [place] access to a 40-mm hip ball.  Attention was then turned  towards the stem side where the stem was sequentially reamed to a level  of 11 and 11 when the lateralizer was used went up to 15 and then 15.5  was put halfway down.  Following  this, attention was turned towards the  cone which was then reamed to a level of D size and then a large spout  was placed.  At this point, a trial cone was placed, a 20 D large.  The  20 x 15 trial was placed with a 36 +8 offset with neutral version.  Really, the hip reduced very easily and popped out as we came  anteriorly.  At that point, I felt like we needed 10 degrees of  anteversion and went to a +12 stem.  This gave a perfectly stable hip  with excellent range of motion stability.  At this point, this was  chosen as a final implant.  The trials were all removed, hip irrigated  thoroughly, a hole eliminator was placed, and final metal liner was  placed and locked in.  The 20 D large cone was then placed with a 36 +12  offset stem with 10 degrees of anteversion relative to the cone, and  once that was placed, a 40-mm trial ball was placed with excellent range  of motion and stability at this point.  The trial was removed.  The  final head was removed.  Final head was placed and the hip was then  reduced with excellent stability and range of motion.  Short external  rotators and  piriformis and posterior capsule were repaired to the  posterior aspect of the hip to the intertrochanteric line and then the  tensor fascia was closed with 1-Vicryl running.  The hip was then closed  with 0 and 2-0 Vicryl and skin staples.  Tensor fascia was closed first  with 1-0 Vicryl running, 0 and 2-0 Vicryl and then skin staples.  Sterile compressive dressing was applied as well as knee immobilizer.  The patient was taken to the recovery room and was noted to be in  satisfactory condition.  Estimated blood loss for this procedure was  about 350 mL.      Harvie Junior, M.D.  Electronically Signed     JLG/MEDQ  D:  11/16/2008  T:  11/17/2008  Job:  [id number]

## 2011-02-28 ENCOUNTER — Encounter: Payer: Self-pay | Admitting: Internal Medicine

## 2011-03-07 ENCOUNTER — Encounter: Payer: Self-pay | Admitting: Internal Medicine

## 2011-03-07 ENCOUNTER — Ambulatory Visit (INDEPENDENT_AMBULATORY_CARE_PROVIDER_SITE_OTHER): Payer: Medicare Other | Admitting: Internal Medicine

## 2011-03-07 ENCOUNTER — Other Ambulatory Visit (INDEPENDENT_AMBULATORY_CARE_PROVIDER_SITE_OTHER): Payer: Medicare Other

## 2011-03-07 VITALS — BP 110/64 | HR 74 | Temp 98.1°F | Ht 72.0 in | Wt 230.0 lb

## 2011-03-07 DIAGNOSIS — N400 Enlarged prostate without lower urinary tract symptoms: Secondary | ICD-10-CM

## 2011-03-07 DIAGNOSIS — J449 Chronic obstructive pulmonary disease, unspecified: Secondary | ICD-10-CM

## 2011-03-07 DIAGNOSIS — Z2911 Encounter for prophylactic immunotherapy for respiratory syncytial virus (RSV): Secondary | ICD-10-CM

## 2011-03-07 DIAGNOSIS — Z96649 Presence of unspecified artificial hip joint: Secondary | ICD-10-CM

## 2011-03-07 DIAGNOSIS — Z0000 Encounter for general adult medical examination without abnormal findings: Secondary | ICD-10-CM

## 2011-03-07 DIAGNOSIS — R5383 Other fatigue: Secondary | ICD-10-CM

## 2011-03-07 DIAGNOSIS — M2550 Pain in unspecified joint: Secondary | ICD-10-CM

## 2011-03-07 DIAGNOSIS — R5381 Other malaise: Secondary | ICD-10-CM

## 2011-03-07 DIAGNOSIS — I10 Essential (primary) hypertension: Secondary | ICD-10-CM

## 2011-03-07 DIAGNOSIS — Z23 Encounter for immunization: Secondary | ICD-10-CM

## 2011-03-07 LAB — BASIC METABOLIC PANEL
CO2: 24 mEq/L (ref 19–32)
Calcium: 9.1 mg/dL (ref 8.4–10.5)
Chloride: 103 mEq/L (ref 96–112)
Creatinine, Ser: 2.1 mg/dL — ABNORMAL HIGH (ref 0.4–1.5)
Sodium: 136 mEq/L (ref 135–145)

## 2011-03-07 LAB — HEPATIC FUNCTION PANEL
AST: 16 U/L (ref 0–37)
Albumin: 4.4 g/dL (ref 3.5–5.2)
Alkaline Phosphatase: 51 U/L (ref 39–117)
Total Protein: 7 g/dL (ref 6.0–8.3)

## 2011-03-07 LAB — CBC WITH DIFFERENTIAL/PLATELET
Basophils Relative: 1 % (ref 0.0–3.0)
Eosinophils Relative: 3.6 % (ref 0.0–5.0)
Hemoglobin: 16.1 g/dL (ref 13.0–17.0)
Lymphocytes Relative: 31 % (ref 12.0–46.0)
MCHC: 32.9 g/dL (ref 30.0–36.0)
Neutro Abs: 4.2 10*3/uL (ref 1.4–7.7)
Neutrophils Relative %: 57.9 % (ref 43.0–77.0)
RBC: 5.21 Mil/uL (ref 4.22–5.81)
WBC: 7.3 10*3/uL (ref 4.5–10.5)

## 2011-03-07 LAB — LIPID PANEL
Cholesterol: 169 mg/dL (ref 0–200)
HDL: 42.4 mg/dL (ref >39.00)
LDL Cholesterol: 104 mg/dL — ABNORMAL HIGH (ref 0–99)
Total CHOL/HDL Ratio: 4
Triglycerides: 115 mg/dL (ref 0.0–149.0)

## 2011-03-07 LAB — URINALYSIS
Ketones, ur: NEGATIVE
Specific Gravity, Urine: 1.025 (ref 1.000–1.030)
Total Protein, Urine: NEGATIVE
Urine Glucose: NEGATIVE
pH: 5.5 (ref 5.0–8.0)

## 2011-03-07 MED ORDER — CLINDAMYCIN HCL 300 MG PO CAPS: 300.0000 mg | ORAL_CAPSULE | Freq: Three times a day (TID) | ORAL | Status: AC

## 2011-03-07 MED ORDER — FLUTICASONE-SALMETEROL 250-50 MCG/DOSE IN AEPB: 1.0000 | INHALATION_SPRAY | Freq: Two times a day (BID) | RESPIRATORY_TRACT | Status: DC

## 2011-03-07 MED ORDER — LISINOPRIL-HYDROCHLOROTHIAZIDE 20-25 MG PO TABS: 1.0000 | ORAL_TABLET | Freq: Every day | ORAL | Status: DC

## 2011-03-07 NOTE — Assessment & Plan Note (Signed)
The current medical regimen is effective;  continue present plan and medications. BP Readings from Last 3 Encounters:  03/07/11 110/64  03/27/10 115/60  07/07/09 120/76

## 2011-03-07 NOTE — Progress Notes (Signed)
Subjective:    Patient ID: Preston Boyd, male    DOB: 11/27/1940, 70 y.o.   MRN: [id number]  HPI  Here for medicare wellness  Diet: heart healthy Physical activity: mod active Depression/mood screen: negative Hearing: intact to whispered voice Visual acuity: grossly normal, performs annual eye exam  ADLs: capable Fall risk: none Home safety: good Cognitive evaluation: intact to orientation, naming, recall and repetition EOL planning: adv directives, full code/ I agree  I have personally reviewed and have noted 1. The patient's medical and social history 2. Their use of alcohol, tobacco or illicit drugs 3. Their current medications and supplements 4. The patient's functional ability including ADL's, fall risks, home safety risks and hearing or visual impairment. 5. Diet and physical activities 6. Evidence for depression or mood disorders  Also reviewed chronic medical issues: hypertension - reports compliance with ongoing medical treatment and no changes in medication dose or frequency.  denies adverse side effects related to current therapy.   generalized OA - hips, knees, hands and neck -  s/p B TKR 07/2009, does not take any meds regularly for pain  Requests blood check for heavy metal due to mild increase in heart ache - but acknowledges his prosthesis is not among those recalled -  migraines - headache occurs 1-2/month  relieved with dark , quiet and OTC meds   GERD hx - occ symptoms - takes OTC meds as needed   COPD - no recent flares - the patient reports compliance with medication(s) as prescribed. Denies adverse side effects.  Past Medical History  Diagnosis Date  . DEGENERATIVE JOINT DISEASE, RIGHT HIP     s/p THR 11/2008  . Osteoarthritis of knee     s/p B TKR 07/2009  . GERD   . Hypertension   . RENAL INSUFFICIENCY   . BENIGN PROSTATIC HYPERTROPHY   . MIGRAINE HEADACHE   . COPD (chronic obstructive pulmonary disease)    Family History  Problem  Relation Age of Onset  . Colon cancer Mother   . Lung cancer Father   . Alcohol abuse Other   . Arthritis Other   . Diabetes Other   . Hypertension Other   . Heart disease Other    History  Substance Use Topics  . Smoking status: Former Smoker    Quit date: 07/22/1962  . Smokeless tobacco: Never Used   Comment: Single, lives alone. 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 Alcohol Use: No    Review of Systems Constitutional: Negative for fever. Positive for mild fatigue Respiratory: Negative for cough and shortness of breath.   Cardiovascular: Negative for chest pain.  Gastrointestinal: Negative for abdominal pain.  Musculoskeletal: Negative for gait problem.  Skin: Negative for rash.  Neurological: Negative for dizziness.  No other specific complaints in a complete review of systems (except as listed in HPI above).     Objective:   Physical Exam BP 110/64  Pulse 74  Temp(Src) 98.1 F (36.7 C) (Oral)  Ht 6' (1.829 m)  Wt 230 lb (104.327 kg)  BMI 31.19 kg/m2  SpO2 97%  Constitutional:  oriented to person, place, and time. appears well-developed and well-nourished. No distress.  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 Neck supple. No JVD present. No thyromegaly present. no nodules/mass Cardiovascular:  Normal rate, regular rhythm and normal heart sounds.  No murmur heard. no BLE edema Pulmonary/Chest: Effort normal and breath sounds normal. No respiratory distress. no wheezes.  Abdominal: Soft. Bowel sounds are normal. Patient exhibits no distension. There is no tenderness. No mass GU: deferred at pt request - follows with uro per pt Musculoskeletal:. Patient exhibits no gross deformity; B knees without effusion; R hip with FROM,  nonpainful  Neurological: he is alert and oriented to person, place, and time. No cranial nerve deficit. Coordination, speech and cogntion normal.  Skin: Skin is warm and dry.  No erythema or ulceration.  Psychiatric: he has a normal mood and affect. behavior is normal. Judgment and thought content normal.   Lab Results  Component Value Date   WBC 10.0 03/27/2010   HGB 15.2 03/27/2010   HCT 45.0 03/27/2010   PLT 129.0* 03/27/2010   ALT 13 10/31/2009   AST 17 10/31/2009   NA 138 11/05/2009   K 4.4 11/05/2009   CL 104 11/05/2009   CREATININE 1.70* 11/05/2009   BUN 22 11/05/2009   CO2 28 11/05/2009   INR 1.52* 11/06/2009   EKG: NSR @ 79bpm; no ischemic change or arrythmia       Assessment & Plan:  AWV/CPX - v70.0 - Today patient counseled on age appropriate routine health concerns for screening and prevention, each reviewed and up to date or declined. Immunizations reviewed and up to date or declined. Labsordered and ECG reviewed. Risk factors for depression reviewed and negative. Hearing function stable (decliens audiology eval at this time); visual acuity are intact. ADLs screened and addressed as needed. Functional ability and level of safety reviewed and appropriate. Education, counseling and referrals performed based on assessed risks today. Patient provided with a copy of personalized plan for preventive services.  Also see problem list. Medications and labs reviewed today.  Arthralgias, mild with hx R THR; prosthesis not on recall list but pt request heavy metal check - labs ordered  Also complains of "pus" draining from right upper gum line following trauma - hx same - pending dental eval but not for >1 week- clinda erx done

## 2011-03-07 NOTE — Patient Instructions (Addendum)
It was good to see you today. Your exam and EKG look good - stay active as you are doing! We have reviewed your prior records including labs and tests today Let us know if (or when) you need a hearing check or hearing aides - we can make a referral for you as needed Test(s) ordered today, including heavy metal check as requested. Your results will be called to you after review (48-72hours after test completion). If any changes need to be made, you will be notified at that time. Shingles shot done today - will not refer for colonoscopy as per your preference, but let us know if you reconsider Medications reviewed, no changes at this time. Refill on medication(s) as discussed today. Please schedule followup in 12 months for annual physical and labs, call sooner if problems. Also antibiotics for your gum infection sent as requested - Your prescription(s) have been submitted to your pharmacy. Please take as directed and contact our office if you believe you are having problem(s) with the medication(s).

## 2011-04-01 LAB — HEAVY METALS, BLOOD
Arsenic: <3
Lead: 8 ug/dL (ref <10)
Mercury, B: <4

## 2011-08-19 ENCOUNTER — Ambulatory Visit (INDEPENDENT_AMBULATORY_CARE_PROVIDER_SITE_OTHER): Payer: Medicare Other | Admitting: Endocrinology

## 2011-08-19 ENCOUNTER — Other Ambulatory Visit: Payer: Medicare Other

## 2011-08-19 ENCOUNTER — Encounter: Payer: Self-pay | Admitting: Endocrinology

## 2011-08-19 VITALS — BP 112/74 | HR 88 | Temp 97.5°F

## 2011-08-19 DIAGNOSIS — N259 Disorder resulting from impaired renal tubular function, unspecified: Secondary | ICD-10-CM

## 2011-08-19 MED ORDER — CEFUROXIME AXETIL 250 MG PO TABS: 250.0000 mg | ORAL_TABLET | Freq: Two times a day (BID) | ORAL | Status: AC

## 2011-08-19 NOTE — Patient Instructions (Addendum)
Skip 2 days of your blood pressure medication, then resume.   i have sent a prescription to your pharmacy, for an antibiotic.   I hope you feel better soon.  If you don't feel better by next week, please call back.   blood tests are being requested for you today.  please call [phone] to hear your test results.  You will be prompted to enter the 9-digit "MRN" number that appears at the top left of this page, followed by #.  Then you will hear the message. (update: i left message on phone-tree:  rx as we discussed)

## 2011-08-19 NOTE — Progress Notes (Signed)
Subjective:    Patient ID: Preston Boyd, male    DOB: Jun 18, 1941, 71 y.o.   MRN: [id number]  HPI Pt states 4 days of slight prod-quality cough in the chest, and assoc chest pain.   He has slight dizziness and muscle cramps Past Medical History  Diagnosis Date  . DEGENERATIVE JOINT DISEASE, RIGHT HIP     s/p THR 11/2008  . Osteoarthritis of knee     s/p B TKR 07/2009  . GERD   . Hypertension   . RENAL INSUFFICIENCY   . BENIGN PROSTATIC HYPERTROPHY   . MIGRAINE HEADACHE   . COPD (chronic obstructive pulmonary disease)     Past Surgical History  Procedure Date  . Tonsillectomy   . Total hip arthroplasty 11/2008    Dr. Luiz Blare  . Replacement total knee bilateral 07/2009    graves    History   Social History  . Marital Status: Single    Spouse Name: N/A    Number of Children: N/A  . Years of Education: N/A   Occupational History  . Not on file.   Social History Main Topics  . Smoking status: Former Smoker    Quit date: 07/22/1962  . Smokeless tobacco: Never Used   Comment: Single, lives alone. 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 Alcohol Use: No  . Drug Use: No  . Sexually Active:    Other Topics Concern  . Not on file   Social History Narrative  . No narrative on file    Current Outpatient Prescriptions on File Prior to Visit  Medication Sig Dispense Refill  . acetaminophen (TYLENOL) 500 MG tablet Take 500 mg by mouth every 6 (six) hours as needed.        Marland Kitchen aspirin 325 MG tablet Take 325 mg by mouth daily.        . Flaxseed, Linseed, (FLAXSEED OIL) 1000 MG CAPS Take by mouth 2 (two) times daily.        . Fluticasone-Salmeterol (ADVAIR DISKUS) 250-50 MCG/DOSE AEPB Inhale 1 puff into the lungs 2 (two) times daily.  60 each  3  . ibuprofen (ADVIL,MOTRIN) 200 MG tablet Take 200 mg by mouth as needed.        Marland Kitchen lisinopril-hydrochlorothiazide (PRINZIDE,ZESTORETIC) 20-25 MG per tablet Take 0.5 tablets by mouth  daily.      . meloxicam (MOBIC) 15 MG tablet Take 15 mg by mouth every other day.       . traMADol (ULTRAM) 50 MG tablet Take 50 mg by mouth every 6 (six) hours as needed.        . zolpidem (AMBIEN) 5 MG tablet Take 5 mg by mouth at bedtime as needed.          No Known Allergies  Family History  Problem Relation Age of Onset  . Colon cancer Mother   . Lung cancer Father   . Alcohol abuse Other   . Arthritis Other   . Diabetes Other   . Hypertension Other   . Heart disease Other     BP 112/74  Pulse 88  Temp(Src) 97.5 F (36.4 C) (Oral)  SpO2 95%   Review of Systems Says he may have had low-grade temp.  He has slight doe.      Objective:   Physical Exam VITAL SIGNS:  See vs page GENERAL: no distress head: no deformity eyes: no periorbital swelling, no proptosis. external nose and ears are normal mouth:  no lesion seen Both tm's are red.   LUNGS:  Clear to auscultation.        Assessment & Plan:  Acute bronchitis, new BP is slightly overcontrolled, prob due to the illness Cramps, not due to secondary hyperparathyroidism.

## 2011-08-20 LAB — PTH, INTACT AND CALCIUM
Calcium, Total (PTH): 9.1 mg/dL (ref 8.4–10.5)
PTH: 63 pg/mL (ref 14.0–72.0)

## 2012-05-05 ENCOUNTER — Other Ambulatory Visit: Payer: Self-pay | Admitting: Internal Medicine

## 2012-05-21 ENCOUNTER — Ambulatory Visit (INDEPENDENT_AMBULATORY_CARE_PROVIDER_SITE_OTHER): Payer: Medicare Other | Admitting: Internal Medicine

## 2012-05-21 ENCOUNTER — Encounter: Payer: Self-pay | Admitting: Internal Medicine

## 2012-05-21 ENCOUNTER — Other Ambulatory Visit (INDEPENDENT_AMBULATORY_CARE_PROVIDER_SITE_OTHER): Payer: Medicare Other

## 2012-05-21 VITALS — BP 110/58 | HR 80 | Temp 98.5°F | Ht 72.0 in | Wt 233.0 lb

## 2012-05-21 DIAGNOSIS — R5381 Other malaise: Secondary | ICD-10-CM

## 2012-05-21 DIAGNOSIS — Z136 Encounter for screening for cardiovascular disorders: Secondary | ICD-10-CM

## 2012-05-21 DIAGNOSIS — Z23 Encounter for immunization: Secondary | ICD-10-CM

## 2012-05-21 DIAGNOSIS — Z0000 Encounter for general adult medical examination without abnormal findings: Secondary | ICD-10-CM

## 2012-05-21 DIAGNOSIS — H9190 Unspecified hearing loss, unspecified ear: Secondary | ICD-10-CM

## 2012-05-21 DIAGNOSIS — R5383 Other fatigue: Secondary | ICD-10-CM

## 2012-05-21 DIAGNOSIS — I10 Essential (primary) hypertension: Secondary | ICD-10-CM

## 2012-05-21 LAB — BASIC METABOLIC PANEL
BUN: 27 mg/dL — ABNORMAL HIGH (ref 6–23)
CO2: 27 mEq/L (ref 19–32)
Chloride: 106 mEq/L (ref 96–112)
Glucose, Bld: 90 mg/dL (ref 70–99)
Potassium: 4.5 mEq/L (ref 3.5–5.1)
Sodium: 139 mEq/L (ref 135–145)

## 2012-05-21 LAB — CBC WITH DIFFERENTIAL/PLATELET
Basophils Absolute: 0 10*3/uL (ref 0.0–0.1)
Eosinophils Absolute: 0.3 10*3/uL (ref 0.0–0.7)
HCT: 50.1 % (ref 39.0–52.0)
Hemoglobin: 16.4 g/dL (ref 13.0–17.0)
Lymphs Abs: 2.2 10*3/uL (ref 0.7–4.0)
MCHC: 32.7 g/dL (ref 30.0–36.0)
MCV: 92.3 fl (ref 78.0–100.0)
Monocytes Absolute: 0.6 10*3/uL (ref 0.1–1.0)
Monocytes Relative: 8.1 % (ref 3.0–12.0)
Neutro Abs: 4.8 10*3/uL (ref 1.4–7.7)
Platelets: 189 10*3/uL (ref 150.0–400.0)
RDW: 13.7 % (ref 11.5–14.6)

## 2012-05-21 LAB — HEPATIC FUNCTION PANEL
Bilirubin, Direct: 0.2 mg/dL (ref 0.0–0.3)
Total Bilirubin: 0.8 mg/dL (ref 0.3–1.2)

## 2012-05-21 LAB — LIPID PANEL
Cholesterol: 177 mg/dL (ref 0–200)
LDL Cholesterol: 108 mg/dL — ABNORMAL HIGH (ref 0–99)

## 2012-05-21 LAB — TSH: TSH: 1.64 u[IU]/mL (ref 0.35–5.50)

## 2012-05-21 NOTE — Progress Notes (Signed)
Subjective:    Patient ID: Preston Boyd, male    DOB: November 05, 1940, 71 y.o.   MRN: [id number]  HPI  Here for medicare wellness  Diet: heart healthy Physical activity: mod active - gym 4x/week and walks 3 mi per day with dog Depression/mood screen: negative Hearing: intact to normal voice in quiet room, not whisper  Visual acuity: grossly normal, performs annual eye exam  ADLs: capable Fall risk: none Home safety: good Cognitive evaluation: intact to orientation, naming, recall and repetition EOL planning: adv directives, full code/ I agree  I have personally reviewed and have noted 1. The patient's medical and social history 2. Their use of alcohol, tobacco or illicit drugs 3. Their current medications and supplements 4. The patient's functional ability including ADL's, fall risks, home safety risks and hearing or visual impairment. 5. Diet and physical activities 6. Evidence for depression or mood disorders  Also reviewed chronic medical issues: hypertension - reports compliance with ongoing medical treatment and no changes in medication dose or frequency. denies adverse side effects related to current therapy.   generalized OA - hips, knees, hands and neck -  s/p B TKR 07/2009, does not take any meds regularly for pain  Requests periodic blood check for heavy metal due to mild increase in heart ache - but acknowledges his prosthesis is not among those recalled - last check 02/2011 WNL  migraines - headache occurs 1-2/month  relieved with dark , quiet and OTC meds   GERD - infrequent symptoms - takes OTC meds as needed   COPD - no recent flares - the patient reports compliance with medication(s) as prescribed. Denies adverse side effects.  Past Medical History  Diagnosis Date  . DEGENERATIVE JOINT DISEASE, RIGHT HIP     s/p THR 11/2008  . Osteoarthritis of knee     s/p B TKR 07/2009  . GERD   . Hypertension   . RENAL INSUFFICIENCY   . BENIGN PROSTATIC HYPERTROPHY   .  MIGRAINE HEADACHE   . COPD (chronic obstructive pulmonary disease)    Family History  Problem Relation Age of Onset  . Colon cancer Mother   . Lung cancer Father   . Alcohol abuse Other   . Arthritis Other   . Diabetes Other   . Hypertension Other   . Heart disease Other    History  Substance Use Topics  . Smoking status: Former Smoker    Quit date: 07/22/1962  . Smokeless tobacco: Never Used   Comment: Single, lives alone. 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 Alcohol Use: No    Review of Systems  Constitutional: Negative for fever. Positive for mild, chronic fatigue Respiratory: Negative for cough and shortness of breath.   Cardiovascular: Negative for chest pain.  Gastrointestinal: Negative for abdominal pain.  Musculoskeletal: Negative for gait problem.  Skin: Negative for rash.  Neurological: Negative for dizziness.  No other specific complaints in a complete review of systems (except as listed in HPI above).     Objective:   Physical Exam  BP 110/58  Pulse 80  Temp 98.5 F (36.9 C) (Oral)  Ht 6' (1.829 m)  Wt 233 lb (105.688 kg)  BMI 31.60 kg/m2  SpO2 95% Wt Readings from Last 3 Encounters:  05/21/12 233 lb (105.688 kg)  03/07/11 230 lb (104.327 kg)  03/27/10 216 lb (97.977 kg)   Constitutional:  he is overweight, but appears well-developed and well-nourished. No distress.  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 PERRL, EOMI - no conjunctivitis or icterus. Neck: Normal range of motion. Neck supple. No JVD or LAD present. No thyromegaly present. no nodules/mass Cardiovascular: Normal rate, regular rhythm and normal heart sounds.  No murmur heard. no BLE edema Pulmonary/Chest: Effort normal and breath  sounds normal. No respiratory distress. no wheezes.  Abdominal: Soft. Bowel sounds are normal. Patient exhibits no distension. There is no tenderness. No mass GU: deferred at pt request - follows with uro per pt Musculoskeletal: Patient exhibits no gross deformity; B knees without effusion; R hip with FROM, nonpainful  Neurological: he is alert and oriented to person, place, and time. No cranial nerve deficit. Coordination, speech and cogntion normal.  Skin: Skin is warm and dry.  No erythema or ulceration.  Psychiatric: he has a normal mood and affect. behavior is normal. Judgment and thought content normal.   Lab Results  Component Value Date   WBC 7.3 03/07/2011   HGB 16.1 03/07/2011   HCT 48.8 03/07/2011   PLT 192.0 03/07/2011   CHOL 169 03/07/2011   TRIG 115.0 03/07/2011   HDL 42.40 03/07/2011   ALT 16 03/07/2011   AST 16 03/07/2011   NA 136 03/07/2011   K 5.0 03/07/2011   CL 103 03/07/2011   CREATININE 2.1* 03/07/2011   BUN 29* 03/07/2011   CO2 24 03/07/2011   TSH 1.36 03/07/2011   PSA 2.31 03/07/2011   INR 1.52* 11/06/2009   EKG: NSR @ 77bpm; no ischemic change or arrythmia unchanged from 02/2011 -      Assessment & Plan:  AWV/CPX - v70.0 - Today patient counseled on age appropriate routine health concerns for screening and prevention, each reviewed and up to date or declined. Immunizations reviewed and up to date or declined. Labsordered and ECG reviewed. Risk factors for depression reviewed and negative. Hearing function noted, refer as below; visual acuity are intact. ADLs screened and addressed as needed. Functional ability and level of safety reviewed and appropriate. Education, counseling and referrals performed based on assessed risks today. Patient provided with a copy of personalized plan for preventive services. He is asked to reconsider his refusal of colonoscopy given family history of mom with colon cancer  Fatigue - nonspecific symptoms/exam - check screening labs  [facility] - refer  to audiology, last check >4years  Also see problem list. Medications and labs reviewed today.

## 2012-05-21 NOTE — Assessment & Plan Note (Signed)
The current medical regimen is effective;  continue present plan and medications. BP Readings from Last 3 Encounters:  05/21/12 110/58  08/19/11 112/74  03/07/11 110/64  check lipids annually

## 2012-05-21 NOTE — Patient Instructions (Signed)
It was good to see you today. We have reviewed your prior records including labs and tests today Health Maintenance reviewed -flu shot done today - all recommended immunizations and age-appropriate screenings are up-to-date or declined. Let us know if you reconsider the recommended colonoscopy screening, especially with mom's history of colon cancer! Test(s) ordered today. Your results will be released to MyChart (or called to you) after review, usually within 72hours after test completion. If any changes need to be made, you will be notified at that same time. Medications reviewed, no changes at this time. Refill on medication(s) as discussed today. we'll make referral to [facility] for hearing test. Our office will contact you regarding appointment(s) once made. Please schedule followup in 1 year for medical review, call sooner if problems.  Health Maintenance, Males A healthy lifestyle and preventative care can promote health and wellness.  Maintain regular health, dental, and eye exams.   Eat a healthy diet. Foods like vegetables, fruits, whole grains, low-fat dairy products, and lean protein foods contain the nutrients you need without too many calories. Decrease your intake of foods high in solid fats, added sugars, and salt. Get information about a proper diet from your caregiver, if necessary.   Regular physical exercise is one of the most important things you can do for your health. Most adults should get at least 150 minutes of moderate-intensity exercise (any activity that increases your heart rate and causes you to sweat) each week. In addition, most adults need muscle-strengthening exercises on 2 or more days a week.     Maintain a healthy weight. The body mass index (BMI) is a screening tool to identify possible weight problems. It provides an estimate of body fat based on height and weight. Your caregiver can help determine your BMI, and can help you achieve or maintain a healthy  weight. For adults 20 years and older:   A BMI below 18.5 is considered underweight.   A BMI of 18.5 to 24.9 is normal.   A BMI of 25 to 29.9 is considered overweight.   A BMI of 30 and above is considered obese.   Maintain normal blood lipids and cholesterol by exercising and minimizing your intake of saturated fat. Eat a balanced diet with plenty of fruits and vegetables. Blood tests for lipids and cholesterol should begin at age 6 and be repeated every 5 years. If your lipid or cholesterol levels are high, you are over 50, or you are a high risk for heart disease, you may need your cholesterol levels checked more frequently. Ongoing high lipid and cholesterol levels should be treated with medicines, if diet and exercise are not effective.   If you smoke, find out from your caregiver how to quit. If you do not use tobacco, do not start.   If you choose to drink alcohol, do not exceed 2 drinks per day. One drink is considered to be 12 ounces (355 mL) of beer, 5 ounces (148 mL) of wine, or 1.5 ounces (44 mL) of liquor.   Avoid use of street drugs. Do not share needles with anyone. Ask for help if you need support or instructions about stopping the use of drugs.   High blood pressure causes heart disease and increases the risk of stroke. Blood pressure should be checked at least every 1 to 2 years. Ongoing high blood pressure should be treated with medicines if weight loss and exercise are not effective.   If you are 85 to 70 years old,  ask your caregiver if you should take aspirin to prevent heart disease.   Diabetes screening involves taking a blood sample to check your fasting blood sugar level. This should be done once every 3 years, after age 76, if you are within normal weight and without risk factors for diabetes. Testing should be considered at a younger age or be carried out more frequently if you are overweight and have at least 1 risk factor for diabetes.   Colorectal cancer can be  detected and often prevented. Most routine colorectal cancer screening begins at the age of 82 and continues through age 33. However, your caregiver may recommend screening at an earlier age if you have risk factors for colon cancer. On a yearly basis, your caregiver may provide home test kits to check for hidden blood in the stool. Use of a small camera at the end of a tube, to directly examine the colon (sigmoidoscopy or colonoscopy), can detect the earliest forms of colorectal cancer. Talk to your caregiver about this at age 70, when routine screening begins. Direct examination of the colon should be repeated every 5 to 10 years through age 71, unless early forms of pre-cancerous polyps or small growths are found.   Hepatitis C blood testing is recommended for all people born from [birth year] through 1964 and any individual with known risks for hepatitis C.   Healthy men should no longer receive prostate-specific antigen (PSA) blood tests as part of routine cancer screening. Consult with your caregiver about prostate cancer screening.   Testicular cancer screening is not recommended for adolescents or adult males who have no symptoms. Screening includes self-exam, caregiver exam, and other screening tests. Consult with your caregiver about any symptoms you have or any concerns you have about testicular cancer.   Practice safe sex. Use condoms and avoid high-risk sexual practices to reduce the spread of sexually transmitted infections (STIs).   Use sunscreen with a sun protection factor (SPF) of 30 or greater. Apply sunscreen liberally and repeatedly throughout the day. You should seek shade when your shadow is shorter than you. Protect yourself by wearing long sleeves, pants, a wide-brimmed hat, and sunglasses year round, whenever you are outdoors.   Notify your caregiver of new moles or changes in moles, especially if there is a change in shape or color. Also notify your caregiver if a mole is larger than  the size of a pencil eraser.   A one-time screening for abdominal aortic aneurysm (AAA) and surgical repair of large AAAs by sound wave imaging (ultrasonography) is recommended for ages 75 to 75 years who are current or former smokers.   Stay current with your immunizations.  Document Released: 01/04/2008 Document Revised: 09/30/2011 Document Reviewed: 12/03/2010 [facility] Patient Information 2013 [place], [place].

## 2012-10-20 HISTORY — PX: CATARACT EXTRACTION, BILATERAL: SHX1313

## 2013-04-06 ENCOUNTER — Other Ambulatory Visit: Payer: Self-pay | Admitting: *Deleted

## 2013-04-06 MED ORDER — LISINOPRIL-HYDROCHLOROTHIAZIDE 20-25 MG PO TABS: 1.0000 | ORAL_TABLET | Freq: Every day | ORAL | Status: DC

## 2013-05-06 ENCOUNTER — Other Ambulatory Visit: Payer: Self-pay | Admitting: Internal Medicine

## 2013-05-18 ENCOUNTER — Ambulatory Visit (INDEPENDENT_AMBULATORY_CARE_PROVIDER_SITE_OTHER): Payer: Medicare Other | Admitting: Internal Medicine

## 2013-05-18 ENCOUNTER — Encounter: Payer: Self-pay | Admitting: Internal Medicine

## 2013-05-18 ENCOUNTER — Other Ambulatory Visit (INDEPENDENT_AMBULATORY_CARE_PROVIDER_SITE_OTHER): Payer: Medicare Other

## 2013-05-18 VITALS — BP 120/74 | HR 84 | Temp 98.4°F | Ht 72.0 in | Wt 222.8 lb

## 2013-05-18 DIAGNOSIS — R5381 Other malaise: Secondary | ICD-10-CM

## 2013-05-18 DIAGNOSIS — N1832 Chronic kidney disease, stage 3b: Secondary | ICD-10-CM | POA: Insufficient documentation

## 2013-05-18 DIAGNOSIS — N183 Chronic kidney disease, stage 3 unspecified: Secondary | ICD-10-CM | POA: Insufficient documentation

## 2013-05-18 DIAGNOSIS — I10 Essential (primary) hypertension: Secondary | ICD-10-CM

## 2013-05-18 DIAGNOSIS — R5383 Other fatigue: Secondary | ICD-10-CM

## 2013-05-18 DIAGNOSIS — Z23 Encounter for immunization: Secondary | ICD-10-CM

## 2013-05-18 DIAGNOSIS — Z0000 Encounter for general adult medical examination without abnormal findings: Secondary | ICD-10-CM

## 2013-05-18 DIAGNOSIS — J449 Chronic obstructive pulmonary disease, unspecified: Secondary | ICD-10-CM

## 2013-05-18 LAB — CBC WITH DIFFERENTIAL/PLATELET
Basophils Relative: 0.8 % (ref 0.0–3.0)
Eosinophils Relative: 3.5 % (ref 0.0–5.0)
HCT: 48.3 % (ref 39.0–52.0)
Lymphs Abs: 2.1 10*3/uL (ref 0.7–4.0)
MCV: 90.1 fl (ref 78.0–100.0)
Monocytes Absolute: 0.6 10*3/uL (ref 0.1–1.0)
Monocytes Relative: 6.9 % (ref 3.0–12.0)
Neutro Abs: 5 10*3/uL (ref 1.4–7.7)
Platelets: 191 10*3/uL (ref 150.0–400.0)
RBC: 5.36 Mil/uL (ref 4.22–5.81)
WBC: 8 10*3/uL (ref 4.5–10.5)

## 2013-05-18 LAB — BASIC METABOLIC PANEL
BUN: 28 mg/dL — ABNORMAL HIGH (ref 6–23)
CO2: 27 mEq/L (ref 19–32)
Calcium: 9.8 mg/dL (ref 8.4–10.5)
Creatinine, Ser: 1.9 mg/dL — ABNORMAL HIGH (ref 0.4–1.5)
GFR: 37.76 mL/min — ABNORMAL LOW (ref >60.00)
Glucose, Bld: 90 mg/dL (ref 70–99)
Sodium: 142 mEq/L (ref 135–145)

## 2013-05-18 LAB — LIPID PANEL
Cholesterol: 192 mg/dL (ref 0–200)
HDL: 42 mg/dL (ref >39.00)

## 2013-05-18 MED ORDER — LISINOPRIL-HYDROCHLOROTHIAZIDE 20-25 MG PO TABS: 1.0000 | ORAL_TABLET | Freq: Every day | ORAL | Status: DC

## 2013-05-18 MED ORDER — FLUTICASONE-SALMETEROL 250-50 MCG/DOSE IN AEPB: 1.0000 | INHALATION_SPRAY | Freq: Two times a day (BID) | RESPIRATORY_TRACT | Status: DC

## 2013-05-18 NOTE — Patient Instructions (Addendum)
It was good to see you today.  Health Maintenance reviewed - all recommended immunizations and age-appropriate screenings are up-to-date. Consider the colon screening as we discussed!  We have reviewed your prior records including labs and tests today  Test(s) ordered today. Your results will be released to MyChart (or called to you) after review, usually within 72hours after test completion. If any changes need to be made, you will be notified at that same time.  Medications reviewed and updated, no changes recommended at this time. Refill on medication(s) as discussed today.  Please schedule followup in 12 months for annual exam, call sooner if problems.   Health Maintenance, Males A healthy lifestyle and preventative care can promote health and wellness.  Maintain regular health, dental, and eye exams.  Eat a healthy diet. Foods like vegetables, fruits, whole grains, low-fat dairy products, and lean protein foods contain the nutrients you need without too many calories. Decrease your intake of foods high in solid fats, added sugars, and salt. Get information about a proper diet from your caregiver, if necessary.  Regular physical exercise is one of the most important things you can do for your health. Most adults should get at least 150 minutes of moderate-intensity exercise (any activity that increases your heart rate and causes you to sweat) each week. In addition, most adults need muscle-strengthening exercises on 2 or more days a week.   Maintain a healthy weight. The body mass index (BMI) is a screening tool to identify possible weight problems. It provides an estimate of body fat based on height and weight. Your caregiver can help determine your BMI, and can help you achieve or maintain a healthy weight. For adults 20 years and older:  A BMI below 18.5 is considered underweight.  A BMI of 18.5 to 24.9 is normal.  A BMI of 25 to 29.9 is considered overweight.  A BMI of 30 and  above is considered obese.  Maintain normal blood lipids and cholesterol by exercising and minimizing your intake of saturated fat. Eat a balanced diet with plenty of fruits and vegetables. Blood tests for lipids and cholesterol should begin at age 38 and be repeated every 5 years. If your lipid or cholesterol levels are high, you are over 50, or you are a high risk for heart disease, you may need your cholesterol levels checked more frequently.Ongoing high lipid and cholesterol levels should be treated with medicines, if diet and exercise are not effective.  If you smoke, find out from your caregiver how to quit. If you do not use tobacco, do not start.  If you choose to drink alcohol, do not exceed 2 drinks per day. One drink is considered to be 12 ounces (355 mL) of beer, 5 ounces (148 mL) of wine, or 1.5 ounces (44 mL) of liquor.  Avoid use of street drugs. Do not share needles with anyone. Ask for help if you need support or instructions about stopping the use of drugs.  High blood pressure causes heart disease and increases the risk of stroke. Blood pressure should be checked at least every 1 to 2 years. Ongoing high blood pressure should be treated with medicines if weight loss and exercise are not effective.  If you are 51 to 71 years old, ask your caregiver if you should take aspirin to prevent heart disease.  Diabetes screening involves taking a blood sample to check your fasting blood sugar level. This should be done once every 3 years, after age 15, if you  are within normal weight and without risk factors for diabetes. Testing should be considered at a younger age or be carried out more frequently if you are overweight and have at least 1 risk factor for diabetes.  Colorectal cancer can be detected and often prevented. Most routine colorectal cancer screening begins at the age of 56 and continues through age 42. However, your caregiver may recommend screening at an earlier age if you  have risk factors for colon cancer. On a yearly basis, your caregiver may provide home test kits to check for hidden blood in the stool. Use of a small camera at the end of a tube, to directly examine the colon (sigmoidoscopy or colonoscopy), can detect the earliest forms of colorectal cancer. Talk to your caregiver about this at age 70, when routine screening begins. Direct examination of the colon should be repeated every 5 to 10 years through age 8, unless early forms of pre-cancerous polyps or small growths are found.  Hepatitis C blood testing is recommended for all people born from [birth year] through 1964 and any individual with known risks for hepatitis C.  Healthy men should no longer receive prostate-specific antigen (PSA) blood tests as part of routine cancer screening. Consult with your caregiver about prostate cancer screening.  Testicular cancer screening is not recommended for adolescents or adult males who have no symptoms. Screening includes self-exam, caregiver exam, and other screening tests. Consult with your caregiver about any symptoms you have or any concerns you have about testicular cancer.  Practice safe sex. Use condoms and avoid high-risk sexual practices to reduce the spread of sexually transmitted infections (STIs).  Use sunscreen with a sun protection factor (SPF) of 30 or greater. Apply sunscreen liberally and repeatedly throughout the day. You should seek shade when your shadow is shorter than you. Protect yourself by wearing long sleeves, pants, a wide-brimmed hat, and sunglasses year round, whenever you are outdoors.  Notify your caregiver of new moles or changes in moles, especially if there is a change in shape or color. Also notify your caregiver if a mole is larger than the size of a pencil eraser.  A one-time screening for abdominal aortic aneurysm (AAA) and surgical repair of large AAAs by sound wave imaging (ultrasonography) is recommended for ages 32 to 62 years who  are current or former smokers.  Stay current with your immunizations. Document Released: 01/04/2008 Document Revised: 09/30/2011 Document Reviewed: 12/03/2010 [facility] Patient Information 2014 [place], [place].

## 2013-05-18 NOTE — Progress Notes (Signed)
Subjective:    Patient ID: Preston Boyd, male    DOB: [birth year], 72 y.o.   MRN: [id number]  HPI Here for medicare wellness  Diet: heart healthy Physical activity: mod active - gym 4x/week and walks 3 mi per day with dog Depression/mood screen: negative Hearing: intact to normal voice in quiet room, not whisper  Visual acuity: grossly normal, performs annual eye exam  ADLs: capable Fall risk: none Home safety: good Cognitive evaluation: intact to orientation, naming, recall and repetition EOL planning: adv directives, full code/ I agree  I have personally reviewed and have noted 1. The patient's medical and social history 2. Their use of alcohol, tobacco or illicit drugs 3. Their current medications and supplements 4. The patient's functional ability including ADL's, fall risks, home safety risks and hearing or visual impairment. 5. Diet and physical activities 6. Evidence for depression or mood disorders  Also reviewed chronic medical issues: hypertension - reports compliance with ongoing medical treatment and no changes in medication dose or frequency. denies adverse side effects related to current therapy.   generalized OA - hips, knees, hands and neck -  s/p B TKR 07/2009, does not take any meds regularly for pain  Previously has requested periodic blood check for heavy metal due to mild "heart ache" - but acknowledges his prosthesis is not among those recalled - last check 02/2011 WNL  migraines - headache occurs 1-2/month  relieved with dark , quiet and OTC meds   GERD - infrequent symptoms - takes OTC meds as needed   COPD - no recent flares - the patient reports compliance with medication(s) as prescribed. Denies adverse side effects.  Past Medical History  Diagnosis Date  . DEGENERATIVE JOINT DISEASE, RIGHT HIP     s/p THR 11/2008  . Osteoarthritis of knee     s/p B TKR 07/2009  . GERD   . Hypertension   . RENAL INSUFFICIENCY   . BENIGN PROSTATIC HYPERTROPHY    . MIGRAINE HEADACHE   . COPD (chronic obstructive pulmonary disease)    Family History  Problem Relation Age of Onset  . Colon cancer Mother   . Lung cancer Father   . Alcohol abuse Other   . Arthritis Other   . Diabetes Other   . Hypertension Other   . Heart disease Other    History  Substance Use Topics  . Smoking status: Former Smoker    Quit date: 07/22/1962  . Smokeless tobacco: Never Used     Comment: Single, lives alone. 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 Alcohol Use: No    Review of Systems Constitutional: Negative for fever. Positive for mild, chronic fatigue Respiratory: Negative for cough and shortness of breath.   Cardiovascular: Negative for chest pain.  Gastrointestinal: Negative for abdominal pain.  Musculoskeletal: Negative for gait problem.  Skin: Negative for rash.  Neurological: Negative for dizziness.  No other specific complaints in a complete review of systems (except as listed in HPI above).     Objective:   Physical Exam BP 120/74  Pulse 84  Temp(Src) 98.4 F (36.9 C) (Oral)  Ht 6' (1.829 m)  Wt 222 lb 12.8 oz (101.061 kg)  BMI 30.21 kg/m2  SpO2 98% Wt Readings from Last 3 Encounters:  05/18/13 222 lb 12.8 oz (101.061 kg)  05/21/12 233 lb (105.688 kg)  03/07/11 230 lb (104.327 kg)   Constitutional:  he is overweight, but appears well-developed and well-nourished.  No distress.  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 PERRL, EOMI - no conjunctivitis or icterus. Neck: Normal range of motion. Neck supple. No JVD or LAD present. No thyromegaly present. no nodules/mass Cardiovascular: Normal rate, regular rhythm and normal heart sounds.  No murmur heard. no BLE edema Pulmonary/Chest:  Effort normal and breath sounds normal. No respiratory distress. no wheezes.  Abdominal: Soft. Bowel sounds are normal. Patient exhibits no distension. There is no tenderness. No mass GU: deferred at pt request - follows with uro per pt Musculoskeletal: Patient exhibits no gross deformity; B knees without effusion; R hip with FROM, nonpainful  Neurological: he is alert and oriented to person, place, and time. No cranial nerve deficit. Coordination, speech and cogntion normal.  Skin: Skin is warm and dry.  No erythema or ulceration.  Psychiatric: he has a normal mood and affect. behavior is normal. Judgment and thought content normal.   Lab Results  Component Value Date   WBC 8.0 05/21/2012   HGB 16.4 05/21/2012   HCT 50.1 05/21/2012   PLT 189.0 05/21/2012   CHOL 177 05/21/2012   TRIG 154.0* 05/21/2012   HDL 38.60* 05/21/2012   ALT 18 05/21/2012   AST 20 05/21/2012   NA 139 05/21/2012   K 4.5 05/21/2012   CL 106 05/21/2012   CREATININE 2.1* 05/21/2012   BUN 27* 05/21/2012   CO2 27 05/21/2012   TSH 1.64 05/21/2012   PSA 2.31 03/07/2011   INR 1.52* 11/06/2009        Assessment & Plan:  AWV/CPX - v70.0 - Today patient counseled on age appropriate routine health concerns for screening and prevention, each reviewed and up to date or declined. Immunizations reviewed and up to date or declined. Labs ordered and reviewed. Risk factors for depression reviewed and negative. Hearing function noted, refer as below; visual acuity are intact. ADLs screened and addressed as needed. Functional ability and level of safety reviewed and appropriate. Education, counseling and referrals performed based on assessed risks today. Patient provided with a copy of personalized plan for preventive services. He is asked to reconsider his refusal of colonoscopy given family history of mom with colon cancer  Fatigue - nonspecific symptoms/exam - check screening labs  [facility] - refer to audiology, last check  >4years  Also see problem list. Medications and labs reviewed today.

## 2013-05-18 NOTE — Progress Notes (Signed)
Pre-visit discussion using our clinic review tool. No additional management support is needed unless otherwise documented below in the visit note.  

## 2013-05-18 NOTE — Assessment & Plan Note (Signed)
The current medical regimen is effective;  continue present plan and medications. BP Readings from Last 3 Encounters:  05/18/13 120/74  05/21/12 110/58  08/19/11 112/74  check lipids annually

## 2013-05-18 NOTE — Assessment & Plan Note (Signed)
Seasonal symptoms -  Uses Advair fall-winter, declines need for Alb MDI ("those don't help") Refill on same

## 2013-06-06 ENCOUNTER — Other Ambulatory Visit: Payer: Self-pay | Admitting: Internal Medicine

## 2013-07-05 ENCOUNTER — Other Ambulatory Visit: Payer: Self-pay | Admitting: Internal Medicine

## 2014-01-27 ENCOUNTER — Other Ambulatory Visit: Payer: Self-pay

## 2014-01-27 MED ORDER — LISINOPRIL-HYDROCHLOROTHIAZIDE 20-25 MG PO TABS: ORAL_TABLET | ORAL | Status: DC

## 2014-01-27 NOTE — Telephone Encounter (Signed)
90 day supply information

## 2014-05-19 ENCOUNTER — Encounter: Payer: Self-pay | Admitting: Family

## 2014-05-19 ENCOUNTER — Ambulatory Visit (INDEPENDENT_AMBULATORY_CARE_PROVIDER_SITE_OTHER): Payer: Medicare HMO | Admitting: Family

## 2014-05-19 ENCOUNTER — Encounter: Payer: Medicare Other | Admitting: Internal Medicine

## 2014-05-19 VITALS — BP 132/80 | HR 96 | Temp 98.3°F | Resp 18 | Ht 74.0 in | Wt 230.8 lb

## 2014-05-19 DIAGNOSIS — Z23 Encounter for immunization: Secondary | ICD-10-CM

## 2014-05-19 DIAGNOSIS — Z0000 Encounter for general adult medical examination without abnormal findings: Secondary | ICD-10-CM | POA: Insufficient documentation

## 2014-05-19 MED ORDER — LISINOPRIL-HYDROCHLOROTHIAZIDE 20-25 MG PO TABS: 1.0000 | ORAL_TABLET | Freq: Every day | ORAL | Status: DC

## 2014-05-19 MED ORDER — FLUTICASONE-SALMETEROL 250-50 MCG/DOSE IN AEPB: 1.0000 | INHALATION_SPRAY | Freq: Two times a day (BID) | RESPIRATORY_TRACT | Status: DC

## 2014-05-19 NOTE — Assessment & Plan Note (Signed)
1) Anticipatory Guidance: Discussed smoke detectors and importance of changing batteries; importance of using suntan lotion; wearing seatbelts and not texting while driving; avoid throw rugs and other slippery material on the floor.   2) Immunizations / Screenings / Labs:  Prevnar and influenza given today, all other immunizations up to date/ screenings are up to date - recommended hearing screen / Obtain BMET, CBC, Lipid profile and TSH  Exam normal with the exception of hearing. Follow up in 1 year for next prevention visit or sooner.

## 2014-05-19 NOTE — Patient Instructions (Addendum)
Thank you for choosing [facility].  Summary/Instructions:   Recommend getting hearing tested again.   Immunizations are up to date with flu and prevnar vaccination.   Please stop by the lab to get your blood work in a FASTING state at your convenience  Follow up for a physical in 1 year or sooner if needed.

## 2014-05-19 NOTE — Progress Notes (Signed)
Pre visit review using our clinic review tool, if applicable. No additional management support is needed unless otherwise documented below in the visit note. 

## 2014-05-19 NOTE — Progress Notes (Signed)
Subjective:    Patient ID: Preston Boyd, male    DOB: Jan 23, 1941, 73 y.o.   MRN: [id number]  Chief Complaint  Patient presents with  . CPE    HPI:  Preston Boyd is a 73 y.o. male who presents today for an annual wellness visit.  1) Health Maintenance - Had cataract surgery in both eyes about 2 years ago; Describes hearing has gotten a little worse; and his balance seems to be getting a little worse especially at night.   Diet - Eats when hungry and doesn't when he is not; generally tries to be balance.  Exercise - Dogs takes him walking 3 times per day and goes to the gym about 3 days per week.   Wt Readings from Last 3 Encounters:  05/19/14 230 lb 12.8 oz (104.69 kg)  05/18/13 222 lb 12.8 oz (101.061 kg)  05/21/12 233 lb (105.688 kg)    2) Preventative Exams / Immunizations:  Dental -- Up to date Vision -- Up to date Immunizations -- Flu shot and prevnar requested; Tetanus up to date - due 06/2019 Colonoscopy -- Up to date - Due 02/2021  Review of Systems  Constitutional: Denies fever, chills, fatigue, or significant weight gain/loss. HENT: Head: Denies headache or neck pain Ears: Denies changes in hearing, ringing in ears, earache, drainage Decreased hearing Nose: Denies discharge, stuffiness, itching, nosebleed, sinus pain Throat: Denies sore throat, hoarseness, dry mouth, sores, thrush Eyes: Denies loss/changes in vision, pain, redness, blurry/double vision, flashing  Cardiovascular: Denies chest pain/discomfort, tightness, palpitations, shortness of breath with activity, difficulty lying down, swelling, sudden awakening with shortness of breath Describes chest pain that is like gas Respiratory: Denies shortness of breath, cough, sputum production, wheezing Gastrointestinal: Denies dysphasia, heartburn, change in appetite, nausea, change in bowel habits, rectal bleeding, constipation, diarrhea, yellow skin or eyes  Genitourinary: Denies frequency, urgency,  burning/pain, blood in urine, incontinence, change in urinary strength. Musculoskeletal: Denies muscle/joint pain, stiffness, back pain, redness or swelling of joints, trauma Describes "electrical tingle" that lasts about 10 minutes that happens infrequently. Pain is located in mid throacic spine and gets weak in the arms.  Skin: Denies rashes, lumps, itching, dryness, color changes, or hair/nail changes Neurological: Denies dizziness, fainting, seizures, weakness, numbness, tingling, tremor Psychiatric - Denies nervousness, stress, depression or memory loss Endocrine: Denies heat or cold intolerance, sweating, frequent urination, excessive thirst, changes in appetite Hematologic: Denies ease of bruising or bleeding  Indicates he is noticing bruising more.  Objective:    BP 132/80  Pulse 96  Temp(Src) 98.3 F (36.8 C) (Oral)  Resp 18  Ht 6\' 2"  (1.88 m)  Wt 230 lb 12.8 oz (104.69 kg)  BMI 29.62 kg/m2  SpO2 97% Nursing note and vital signs reviewed.  Physical Exam  Constitutional: He is oriented to person, place, and time. He appears well-developed and well-nourished. No distress.  HENT:  Right Ear: Tympanic membrane, external ear and ear canal normal. Decreased hearing is noted.  Left Ear: External ear and ear canal normal. Decreased hearing is noted.  Nose: Nose normal. Right sinus exhibits no maxillary sinus tenderness and no frontal sinus tenderness. Left sinus exhibits no maxillary sinus tenderness and no frontal sinus tenderness.  Mouth/Throat: Uvula is midline, oropharynx is clear and moist and mucous membranes are normal.  Neck: No JVD present. No tracheal deviation present. No thyromegaly present.  Cardiovascular: Normal rate, regular rhythm, normal heart sounds and intact distal pulses.   Pulmonary/Chest: Effort normal and breath sounds normal.  Abdominal: Soft. Bowel sounds are normal. He exhibits no distension and no mass. There is no tenderness. There is no rebound and no  guarding.  Musculoskeletal: Normal range of motion.  Lymphadenopathy:    He has no cervical adenopathy.  Neurological: He is alert and oriented to person, place, and time. He has normal reflexes. No cranial nerve deficit.  Skin: Skin is warm and dry.  Psychiatric: He has a normal mood and affect. His behavior is normal. Judgment and thought content normal.        Assessment & Plan:

## 2014-05-26 ENCOUNTER — Telehealth: Payer: Self-pay | Admitting: Family

## 2014-05-26 ENCOUNTER — Other Ambulatory Visit (INDEPENDENT_AMBULATORY_CARE_PROVIDER_SITE_OTHER): Payer: Medicare HMO

## 2014-05-26 DIAGNOSIS — I10 Essential (primary) hypertension: Secondary | ICD-10-CM

## 2014-05-26 DIAGNOSIS — Z0000 Encounter for general adult medical examination without abnormal findings: Secondary | ICD-10-CM

## 2014-05-26 DIAGNOSIS — Z79899 Other long term (current) drug therapy: Secondary | ICD-10-CM

## 2014-05-26 LAB — LIPID PANEL
Cholesterol: 181 mg/dL (ref 0–200)
HDL: 32.9 mg/dL — ABNORMAL LOW (ref >39.00)
LDL Cholesterol: 129 mg/dL — ABNORMAL HIGH (ref 0–99)
NonHDL: 148.1
Total CHOL/HDL Ratio: 6
Triglycerides: 95 mg/dL (ref 0.0–149.0)
VLDL: 19 mg/dL (ref 0.0–40.0)

## 2014-05-26 LAB — CBC WITH DIFFERENTIAL/PLATELET
Basophils Absolute: 0.1 10*3/uL (ref 0.0–0.1)
Basophils Relative: 1 % (ref 0.0–3.0)
Eosinophils Absolute: 0.4 10*3/uL (ref 0.0–0.7)
Eosinophils Relative: 5.3 % — ABNORMAL HIGH (ref 0.0–5.0)
HCT: 49.8 % (ref 39.0–52.0)
Hemoglobin: 16.4 g/dL (ref 13.0–17.0)
Lymphocytes Relative: 30.2 % (ref 12.0–46.0)
Lymphs Abs: 2.3 10*3/uL (ref 0.7–4.0)
MCHC: 32.8 g/dL (ref 30.0–36.0)
MCV: 91.6 fl (ref 78.0–100.0)
Monocytes Absolute: 0.5 10*3/uL (ref 0.1–1.0)
Monocytes Relative: 6.4 % (ref 3.0–12.0)
Neutro Abs: 4.3 10*3/uL (ref 1.4–7.7)
Neutrophils Relative %: 57.1 % (ref 43.0–77.0)
Platelets: 195 10*3/uL (ref 150.0–400.0)
RBC: 5.44 Mil/uL (ref 4.22–5.81)
RDW: 14 % (ref 11.5–15.5)
WBC: 7.6 10*3/uL (ref 4.0–10.5)

## 2014-05-26 LAB — BASIC METABOLIC PANEL
BUN: 26 mg/dL — ABNORMAL HIGH (ref 6–23)
CO2: 22 mEq/L (ref 19–32)
Calcium: 9.2 mg/dL (ref 8.4–10.5)
Chloride: 105 mEq/L (ref 96–112)
Creatinine, Ser: 2 mg/dL — ABNORMAL HIGH (ref 0.4–1.5)
GFR: 34.85 mL/min — ABNORMAL LOW (ref >60.00)
Glucose, Bld: 103 mg/dL — ABNORMAL HIGH (ref 70–99)
Potassium: 4.3 mEq/L (ref 3.5–5.1)
Sodium: 138 mEq/L (ref 135–145)

## 2014-05-26 LAB — TSH: TSH: 2.03 u[IU]/mL (ref 0.35–4.50)

## 2014-05-26 NOTE — Telephone Encounter (Signed)
Please call patient and inform him that his labs appear okay. His cholesterol and kidney disease has remained stable and there are no changes that need to be made right now. His blood counts and electrolytes are within the expected range.

## 2014-05-26 NOTE — Telephone Encounter (Signed)
Called pt to let him know labs were normal and cholesterol and kidney disease are stable.

## 2014-12-01 ENCOUNTER — Ambulatory Visit (INDEPENDENT_AMBULATORY_CARE_PROVIDER_SITE_OTHER): Payer: Medicare HMO | Admitting: Internal Medicine

## 2014-12-01 ENCOUNTER — Encounter: Payer: Self-pay | Admitting: Internal Medicine

## 2014-12-01 ENCOUNTER — Ambulatory Visit (INDEPENDENT_AMBULATORY_CARE_PROVIDER_SITE_OTHER)
Admission: RE | Admit: 2014-12-01 | Discharge: 2014-12-01 | Disposition: A | Discharge status: Home / Self Care | Payer: Medicare HMO | Source: Ambulatory Visit | Attending: Internal Medicine | Admitting: Internal Medicine

## 2014-12-01 VITALS — BP 100/66 | HR 83 | Temp 98.3°F | Ht 74.0 in | Wt 229.2 lb

## 2014-12-01 DIAGNOSIS — K2100 Gastro-esophageal reflux disease with esophagitis, without bleeding: Secondary | ICD-10-CM

## 2014-12-01 DIAGNOSIS — N183 Chronic kidney disease, stage 3 unspecified: Secondary | ICD-10-CM

## 2014-12-01 DIAGNOSIS — R079 Chest pain, unspecified: Secondary | ICD-10-CM | POA: Diagnosis not present

## 2014-12-01 DIAGNOSIS — E785 Hyperlipidemia, unspecified: Secondary | ICD-10-CM | POA: Diagnosis not present

## 2014-12-01 DIAGNOSIS — R739 Hyperglycemia, unspecified: Secondary | ICD-10-CM

## 2014-12-01 DIAGNOSIS — N259 Disorder resulting from impaired renal tubular function, unspecified: Secondary | ICD-10-CM

## 2014-12-01 DIAGNOSIS — Z8249 Family history of ischemic heart disease and other diseases of the circulatory system: Secondary | ICD-10-CM | POA: Diagnosis not present

## 2014-12-01 NOTE — Patient Instructions (Addendum)
  Your next office appointment will be determined based upon review of your pending labs  and  xrays  Those instructions will be transmitted to you by mail for your records.  Critical results will be called.   Followup as needed for any active or acute issue. Please report any significant change in your symptoms.  Reflux of gastric acid may be asymptomatic as this may occur mainly during sleep.The triggers for reflux  include stress; the "aspirin family" ; alcohol; peppermint; and caffeine (coffee, tea, cola, and chocolate). The aspirin family would include aspirin and the nonsteroidal agents such as ibuprofen &  Naproxen. Tylenol would not cause reflux. If having symptoms ; food & drink should be avoided for @ least 2 hours before going to bed.

## 2014-12-01 NOTE — Progress Notes (Signed)
Pre visit review using our clinic review tool, if applicable. No additional management support is needed unless otherwise documented below in the visit note. 

## 2014-12-01 NOTE — Progress Notes (Signed)
   Subjective:    Patient ID: Preston BusmanCharles O Nevills, male    DOB: 10-16-1940, 74 y.o.   MRN: [id number]  HPI Since last Summer he describes exertional chest discomfort when walking his dog. It typically occurs after 3/4 of a mile at a speed of 2.5 miles per hour. This occurs across the mid upper chest. It does not radiate and is not associated nausea or diaphoresis.  He does have some dyspepsia. He also describes "gallbladder disease". The dyspepsia has been issue for several years. He has occasional dysphagia 1-2 times per month.  He quit smoking 1964. He smoked for 15 years at 1.5 packs per day in addition to pipe smoking and cigar smoking on occasion.  His strong family history of coronary disease in both sides of the family. 2 maternal uncles had heart attacks below the age 73.  Review of Systems   Palpitations, tachycardia, exertional dyspnea, paroxysmal nocturnal dyspnea, claudication or edema are absent.       Objective:   Physical Exam  Pertinent or positive findings include: He is extremely hard of hearing.  He has a lower dental plate.  Abdomen is protuberant.  Pedal pulses are equal but decreased.  Homans sign is negative  General appearance :adequately nourished; in no distress. Eyes: No conjunctival inflammation or scleral icterus is present. Oral exam:  Lips and gums are healthy appearing.There is no oropharyngeal erythema or exudate noted. Heart:  Normal rate and regular rhythm. S1 and S2 normal without gallop, murmur, click, rub or other extra sounds   Lungs:Chest clear to auscultation; no wheezes, rhonchi,rales ,or rubs present.No increased work of breathing.  Abdomen: bowel sounds normal, soft and non-tender without masses, organomegaly or hernias noted.  No guarding or rebound.  Vascular : all pulses equal ; no bruits present. Skin:Warm & dry.  Intact without suspicious lesions or rashes ; no tenting or jaundice  Lymphatic: No lymphadenopathy is noted about the  head, neck, axilla Neuro: Strength, tone & DTRs normal.        Assessment & Plan:  #1 exertional chest pain  #2 strong family history coronary disease  #3 GERD with intermittent dysphagia  Plan: See orders. Once any possible cardiac process has been assessed; the GERD which is symptomatic should be evaluated as well.

## 2014-12-02 ENCOUNTER — Other Ambulatory Visit (INDEPENDENT_AMBULATORY_CARE_PROVIDER_SITE_OTHER): Payer: Medicare HMO

## 2014-12-02 ENCOUNTER — Other Ambulatory Visit: Payer: Self-pay | Admitting: Internal Medicine

## 2014-12-02 DIAGNOSIS — R739 Hyperglycemia, unspecified: Secondary | ICD-10-CM | POA: Diagnosis not present

## 2014-12-02 DIAGNOSIS — N183 Chronic kidney disease, stage 3 unspecified: Secondary | ICD-10-CM

## 2014-12-02 DIAGNOSIS — Z8249 Family history of ischemic heart disease and other diseases of the circulatory system: Secondary | ICD-10-CM

## 2014-12-02 DIAGNOSIS — R079 Chest pain, unspecified: Secondary | ICD-10-CM

## 2014-12-02 DIAGNOSIS — E785 Hyperlipidemia, unspecified: Secondary | ICD-10-CM

## 2014-12-02 LAB — BASIC METABOLIC PANEL
BUN: 29 mg/dL — ABNORMAL HIGH (ref 6–23)
CALCIUM: 9.9 mg/dL (ref 8.4–10.5)
CO2: 25 mEq/L (ref 19–32)
CREATININE: 1.99 mg/dL — AB (ref 0.40–1.50)
Chloride: 100 mEq/L (ref 96–112)
GFR: 35.2 mL/min — ABNORMAL LOW (ref >60.00)
Glucose, Bld: 101 mg/dL — ABNORMAL HIGH (ref 70–99)
Potassium: 5 mEq/L (ref 3.5–5.1)
SODIUM: 134 meq/L — AB (ref 135–145)

## 2014-12-02 LAB — HEMOGLOBIN A1C: HEMOGLOBIN A1C: 5.7 % (ref 4.6–6.5)

## 2014-12-05 ENCOUNTER — Encounter: Payer: Self-pay | Admitting: Internal Medicine

## 2014-12-05 DIAGNOSIS — E7800 Pure hypercholesterolemia, unspecified: Secondary | ICD-10-CM | POA: Insufficient documentation

## 2014-12-05 DIAGNOSIS — E785 Hyperlipidemia, unspecified: Secondary | ICD-10-CM | POA: Insufficient documentation

## 2014-12-05 LAB — NMR LIPOPROFILE WITH LIPIDS
Cholesterol, Total: 162 mg/dL (ref 100–199)
HDL PARTICLE NUMBER: 22.4 umol/L — AB (ref >=30.5)
HDL SIZE: 8.8 nm — AB (ref >=9.2)
HDL-C: 36 mg/dL — ABNORMAL LOW (ref >39)
LARGE VLDL-P: 2.1 nmol/L (ref <=2.7)
LDL (calc): 101 mg/dL — ABNORMAL HIGH (ref 0–99)
LDL Particle Number: 1461 nmol/L — ABNORMAL HIGH (ref <1000)
LDL Size: 20.8 nm (ref >=20.8)
LP-IR Score: 50 — ABNORMAL HIGH (ref <=45)
Small LDL Particle Number: 798 nmol/L — ABNORMAL HIGH (ref <=527)
TRIGLYCERIDES: 124 mg/dL (ref 0–149)
VLDL Size: 40.8 nm (ref <=46.6)

## 2015-01-20 DIAGNOSIS — Z9289 Personal history of other medical treatment: Secondary | ICD-10-CM

## 2015-01-20 DIAGNOSIS — I2510 Atherosclerotic heart disease of native coronary artery without angina pectoris: Secondary | ICD-10-CM

## 2015-01-20 HISTORY — DX: Personal history of other medical treatment: Z92.89

## 2015-01-20 HISTORY — DX: Atherosclerotic heart disease of native coronary artery without angina pectoris: I25.10

## 2015-02-03 ENCOUNTER — Encounter: Payer: Medicare HMO | Admitting: Physician Assistant

## 2015-02-03 ENCOUNTER — Ambulatory Visit (INDEPENDENT_AMBULATORY_CARE_PROVIDER_SITE_OTHER): Payer: Medicare HMO

## 2015-02-03 DIAGNOSIS — R079 Chest pain, unspecified: Secondary | ICD-10-CM

## 2015-02-03 DIAGNOSIS — R9439 Abnormal result of other cardiovascular function study: Secondary | ICD-10-CM

## 2015-02-03 LAB — EXERCISE TOLERANCE TEST
MPHR: 148 {beats}/min
Rest HR: 61 {beats}/min

## 2015-02-03 NOTE — Patient Instructions (Signed)
Medication Instructions:   Labwork: NONE  Testing/Procedures: Your physician has requested that you have en exercise stress myoview. For further information please visit [email or web address]. Please follow instruction sheet, as given.   Follow-Up:   Any Other Special Instructions Will Be Listed Below (If Applicable).

## 2015-02-06 ENCOUNTER — Telehealth (HOSPITAL_COMMUNITY): Payer: Self-pay

## 2015-02-06 NOTE — Telephone Encounter (Signed)
Left message on voicemail in reference to upcoming appointment scheduled for 02-08-2015. Phone number given for a call back so details instructions can be given. Randa EvensEdwards, Drayton Tieu A

## 2015-02-07 ENCOUNTER — Telehealth (HOSPITAL_COMMUNITY): Payer: Self-pay

## 2015-02-07 NOTE — Telephone Encounter (Signed)
Patient given detailed instructions per Myocardial Perfusion Study Information Sheet for test on 02-08-2015 at 0900. Patient Notified to arrive 15 minutes early, and that it is imperative to arrive on time for appointment to keep from having the test rescheduled. Patient verbalized understanding. Randa EvensEdwards, Hula Tasso A

## 2015-02-08 ENCOUNTER — Ambulatory Visit (HOSPITAL_COMMUNITY): Payer: Medicare HMO | Attending: Cardiovascular Disease

## 2015-02-08 DIAGNOSIS — R9439 Abnormal result of other cardiovascular function study: Secondary | ICD-10-CM

## 2015-02-08 LAB — MYOCARDIAL PERFUSION IMAGING
CHL CUP NUCLEAR SSS: 16
CSEPPHR: 71 {beats}/min
LV dias vol: 66 mL
LV sys vol: 21 mL
RATE: 0.35
Rest HR: 65 {beats}/min
SDS: 12
SRS: 4
TID: 1.09

## 2015-02-08 MED ORDER — TECHNETIUM TC 99M SESTAMIBI GENERIC - CARDIOLITE
10.2000 | Freq: Once | INTRAVENOUS | Status: AC | PRN
  Administered 2015-02-08: 10 via INTRAVENOUS

## 2015-02-08 MED ORDER — REGADENOSON 0.4 MG/5ML IV SOLN
0.4000 mg | Freq: Once | INTRAVENOUS | Status: AC
  Administered 2015-02-08: 0.4 mg via INTRAVENOUS

## 2015-02-08 MED ORDER — TECHNETIUM TC 99M SESTAMIBI GENERIC - CARDIOLITE
31.2000 | Freq: Once | INTRAVENOUS | Status: AC | PRN
  Administered 2015-02-08: 31.2 via INTRAVENOUS

## 2015-02-09 ENCOUNTER — Encounter: Payer: Self-pay | Admitting: Physician Assistant

## 2015-02-09 ENCOUNTER — Telehealth: Payer: Self-pay | Admitting: *Deleted

## 2015-02-09 NOTE — Telephone Encounter (Signed)
Pt notified of abnormal myoview results by phone. Pt agreeable to plan of care to new pt appt for further evaluation. I advised scheduling will call him with an appt to be seen in the next few days, pt said ok and thank you.

## 2015-02-13 ENCOUNTER — Ambulatory Visit (INDEPENDENT_AMBULATORY_CARE_PROVIDER_SITE_OTHER): Payer: Medicare HMO | Admitting: Cardiology

## 2015-02-13 ENCOUNTER — Encounter: Payer: Self-pay | Admitting: Cardiology

## 2015-02-13 VITALS — BP 130/62 | HR 66 | Ht 74.0 in | Wt 225.0 lb

## 2015-02-13 DIAGNOSIS — I10 Essential (primary) hypertension: Secondary | ICD-10-CM | POA: Diagnosis not present

## 2015-02-13 DIAGNOSIS — I200 Unstable angina: Secondary | ICD-10-CM | POA: Diagnosis not present

## 2015-02-13 DIAGNOSIS — N183 Chronic kidney disease, stage 3 unspecified: Secondary | ICD-10-CM

## 2015-02-13 DIAGNOSIS — R9439 Abnormal result of other cardiovascular function study: Secondary | ICD-10-CM

## 2015-02-13 DIAGNOSIS — R931 Abnormal findings on diagnostic imaging of heart and coronary circulation: Secondary | ICD-10-CM | POA: Diagnosis not present

## 2015-02-13 DIAGNOSIS — Z5181 Encounter for therapeutic drug level monitoring: Secondary | ICD-10-CM

## 2015-02-13 LAB — BASIC METABOLIC PANEL
BUN: 18 mg/dL (ref 6–23)
CO2: 25 mEq/L (ref 19–32)
Calcium: 9.4 mg/dL (ref 8.4–10.5)
Chloride: 107 mEq/L (ref 96–112)
Creatinine, Ser: 1.77 mg/dL — ABNORMAL HIGH (ref 0.40–1.50)
GFR: 40.28 mL/min — ABNORMAL LOW (ref >60.00)
Glucose, Bld: 84 mg/dL (ref 70–99)
Potassium: 4.2 mEq/L (ref 3.5–5.1)
Sodium: 139 mEq/L (ref 135–145)

## 2015-02-13 LAB — CBC WITH DIFFERENTIAL/PLATELET
BASOS ABS: 0.1 10*3/uL (ref 0.0–0.1)
Basophils Relative: 0.7 % (ref 0.0–3.0)
EOS ABS: 0.3 10*3/uL (ref 0.0–0.7)
Eosinophils Relative: 3.7 % (ref 0.0–5.0)
HEMATOCRIT: 49.5 % (ref 39.0–52.0)
Hemoglobin: 16.6 g/dL (ref 13.0–17.0)
Lymphocytes Relative: 30.3 % (ref 12.0–46.0)
Lymphs Abs: 2.5 10*3/uL (ref 0.7–4.0)
MCHC: 33.5 g/dL (ref 30.0–36.0)
MCV: 91.5 fl (ref 78.0–100.0)
MONOS PCT: 6.6 % (ref 3.0–12.0)
Monocytes Absolute: 0.6 10*3/uL (ref 0.1–1.0)
Neutro Abs: 4.9 10*3/uL (ref 1.4–7.7)
Neutrophils Relative %: 58.7 % (ref 43.0–77.0)
Platelets: 179 10*3/uL (ref 150.0–400.0)
RBC: 5.41 Mil/uL (ref 4.22–5.81)
RDW: 14.8 % (ref 11.5–15.5)
WBC: 8.4 10*3/uL (ref 4.0–10.5)

## 2015-02-13 LAB — PROTIME-INR
INR: 1 ratio (ref 0.8–1.0)
PROTHROMBIN TIME: 11.5 s (ref 9.6–13.1)

## 2015-02-13 NOTE — Progress Notes (Signed)
Cardiology Office Note   Date:  02/13/2015   ID:  Preston Boyd, DOB [birth year], MRN [id number]  PCP:  Preston Paci, MD  Cardiologist:  Dr. Alwyn Boyd    Chief Complaint  Patient presents with  . Shortness of Breath      History of Present Illness: Preston Boyd is a 74 y.o. male who presents for cardiac evaluation after seen by his PCP for exertional chest pain.   Most the pain is rt upper shoulder with exertion and resolves with rest.  Occurs with walking the dog.  Occurs about half to 3/4 mile into walk.  Also he has had chest discomfort as well.  He initially wanted to think this was his GB- known gallstones for 20 years.  occ attack.  But he was concerned due to more increasing episodes of the discomfort.  No previous hx of CAD.  Dr. Alwyn Boyd ordered ETT and he had about 1 mm changes and it was then recommended to have lexiscan myoview which he did have. See Results below.        Nuclear stress EF: 67%.  There was no ST segment deviation noted during stress.  Defect 1: There is a medium defect of moderate severity present in the mid inferoseptal, mid inferior and apical inferior location.  This is an intermediate risk study.  Findings consistent with ischemia.  The left ventricular ejection fraction is normal (55-65%).  There is a medium size, moderate intensity perfusion defect in the mid and apical inferior and mid inferolateral walls. Basal inferior and inferolateral segments are affected by significant extracardiac activity uptake and can't be evaluated.  There is also exertional hypotension and TID suggestive of ischemia.  He has family Hx CAD, HTN and dyslipidemia.     Due to +results,  I saw the pt along with Dr. Johney Boyd and we discussed cardiac cath with the pt. With his symptoms and positive study we recommend cath.   Pt was agreeable.   Past Medical History  Diagnosis Date  . DEGENERATIVE JOINT DISEASE, RIGHT HIP     s/p THR 11/2008  . Osteoarthritis  of knee     s/p B TKR 07/2009  . GERD   . Hypertension   . RENAL INSUFFICIENCY   . BENIGN PROSTATIC HYPERTROPHY   . MIGRAINE HEADACHE   . COPD (chronic obstructive pulmonary disease)   . CKD (chronic kidney disease) stage 3, GFR 30-59 ml/min   . History of cardiovascular stress test     Lexiscan Myoview 7/16:  Inferoseptal, inferior and apical ischemia, EF 67%, TID 1.09; intermediate risk    Past Surgical History  Procedure Laterality Date  . Tonsillectomy    . Total hip arthroplasty  11/2008    Dr. Luiz Boyd  . Replacement total knee bilateral  07/2009    graves  . Cataract extraction, bilateral  10/2012     Current Outpatient Prescriptions  Medication Sig Dispense Refill  . acetaminophen (TYLENOL) 500 MG tablet Take 500 mg by mouth every 6 (six) hours as needed for moderate pain.     Marland Kitchen aspirin 325 MG tablet Take 325 mg by mouth daily.      . Flaxseed, Linseed, (FLAXSEED OIL) 1000 MG CAPS Take by mouth 2 (two) times daily.      . Fluticasone-Salmeterol (ADVAIR DISKUS) 250-50 MCG/DOSE AEPB Inhale 1 puff into the lungs 2 (two) times daily. 60 each 3  . GuaiFENesin (MUCINEX PO) Take 1 tablet by mouth as needed (CONGESTION).     Marland Kitchen  ibuprofen (ADVIL,MOTRIN) 200 MG tablet Take 200 mg by mouth as needed for moderate pain.     Marland Kitchen lisinopril-hydrochlorothiazide (PRINZIDE,ZESTORETIC) 20-25 MG per tablet 1 tab PO daily 90 tablet 3   No current facility-administered medications for this visit.    Allergies:   Other    Social History:  The patient  reports that he quit smoking about 52 years ago. He has never used smokeless tobacco. He reports that he drinks alcohol. He reports that he does not use illicit drugs.   Family History:  The patient's family history includes Alcohol abuse in his other; Arthritis in his other; Colon cancer in his mother; Diabetes in his other; Heart attack in his maternal uncle; Heart disease in his other; Hypertension in his other; Lung cancer in his father; Stroke in  his paternal grandfather.    ROS:  General:no colds or fevers, no weight changes Skin:no rashes or ulcers HEENT:no blurred vision, no congestion CV:see HPI PUL:see HPI GI:no diarrhea constipation or melena, no indigestion- occ GI GB flare GU:no hematuria, no dysuria MS:no joint pain, no claudication Neuro:hx  Syncope- last episode 2 years ago- occurs with giving blood , significant pain- vasovagal , no lightheadedness Endo:no diabetes, no thyroid disease  Wt Readings from Last 3 Encounters:  02/13/15 225 lb (102.059 kg)  02/08/15 225 lb (102.059 kg)  12/01/14 229 lb 4 oz (103.987 kg)     PHYSICAL EXAM: VS:  BP 130/62 mmHg  Pulse 66  Ht 6\' 2"  (1.88 m)  Wt 225 lb (102.059 kg)  BMI 28.88 kg/m2 , BMI Body mass index is 28.88 kg/(m^2). General:Pleasant affect, NAD Skin:Warm and dry, brisk capillary refill HEENT:normocephalic, sclera clear, mucus membranes moist Neck:supple, no JVD, no bruits  Heart:S1S2 RRR without murmur, gallup, rub or click Lungs:clear without rales, rhonchi, or wheezes WUJ:WJXB, non tender, + BS, do not palpate liver spleen or masses Ext:no lower ext edema, 2+ pedal pulses, 2+ radial pulses Neuro:alert and oriented X 3, MAE, follows commands, + facial symmetry    EKG:  EKG is ordered today. The ekg ordered today demonstrates SR no acute changes, normal EKG.   Recent Labs: 05/26/2014: Hemoglobin 16.4; Platelets 195.0; TSH 2.03 12/02/2014: BUN 29*; Creatinine, Ser 1.99*; Potassium 5.0; Sodium 134*    Lipid Panel    Component Value Date/Time   CHOL 162 12/02/2014 0806   CHOL 181 05/26/2014 0832   TRIG 124 12/02/2014 0806   TRIG 95.0 05/26/2014 0832   HDL 36* 12/02/2014 0806   HDL 32.90* 05/26/2014 0832   CHOLHDL 6 05/26/2014 0832   VLDL 19.0 05/26/2014 0832   LDLCALC 101* 12/02/2014 0806   LDLCALC 129* 05/26/2014 0832       Other studies Reviewed: Additional studies/ records that were reviewed today include: reviewed NMR nuc and ETT, PCP  note. .   ASSESSMENT AND PLAN:  1.  cresendo angina with abnormal nuc study with inf/lat ischemia.  Will plan cardiac cath this week with Dr. Tresa Endo and then Dr. Tresa Endo will be his primary cardiologist.  We discussed and the patient understands that risks included but are not limited to stroke (1 in 1000), death (1 in 1000), kidney failure [usually temporary] (1 in 500), bleeding (1 in 200), allergic reaction [possibly serious] (1 in 200).  He agrees to proceed.  2. CKD- will check labs and plan for pt to come to Short stay 5-6 hours before procedure for hydration.  We will hol ibuprofen and lisinopril hctz on Tuesday and Wed. Before the study.  3. Hyperlipidemia, will treat depending on cardiac cath.   4. HTN controlled.   Dr. Johney Boyd saw and assessed the pt and agreed with plan.      Current medicines are reviewed with the patient today.  The patient Has no concerns regarding medicines.  The following changes have been made:  See above Labs/ tests ordered today include:see above  Disposition:   FU:  see above  Signed, Leone Brand, NP  02/13/2015 11:08 AM    [facility] [address] [place], [place], [place]  [address]/ [address] [employer] [employer] [address] [place], [place] Phone: [phone]; Fax: [phone]  [phone]  I have seen, examined the patient, and reviewed the above assessment and plan.  Changes to above are made where necessary.    Co Sign: Hillis Range, MD 02/13/2015 9:42 PM

## 2015-02-13 NOTE — Patient Instructions (Addendum)
Medication Instructions:  HOLD Lisinopril HCTZ and Ibuprofen until after your procedure  Labwork: Bmet, cbc, Pt/Inr  Today  Testing/Procedures: Your physician has requested that you have a cardiac catheterization. Cardiac catheterization is used to diagnose and/or treat various heart conditions. Doctors may recommend this procedure for a number of different reasons. The most common reason is to evaluate chest pain. Chest pain can be a symptom of coronary artery disease (CAD), and cardiac catheterization can show whether plaque is narrowing or blocking your heart's arteries. This procedure is also used to evaluate the valves, as well as measure the blood flow and oxygen levels in different parts of your heart. For further information please visit [email or web address] Please follow instruction sheet, as given.     Follow-Up: Your physician recommends that you schedule a follow-up appointment pending your procedure     Any Other Special Instructions Will Be Listed Below (If Applicable).  Marland Kitchen

## 2015-02-14 ENCOUNTER — Telehealth: Payer: Self-pay

## 2015-02-14 NOTE — Telephone Encounter (Signed)
-----   Message from Leone Brand, NP sent at 02/13/2015  4:54 PM EDT ----- Please let pt know labs are better than they were 2 months ago, but should still hold lisinopril and HCTZ and ibuprofen Tuesday and Wed. For the cath.  Thanks.

## 2015-02-14 NOTE — Telephone Encounter (Signed)
Pt aware of lab results. Please let pt know labs are better than they were 2 months ago, but should still hold lisinopril and HCTZ and ibuprofen Tuesday and Wed. For the cath. Thanks. Pt verbalized understanding.

## 2015-02-15 ENCOUNTER — Encounter (HOSPITAL_COMMUNITY)
Admission: RE | Disposition: A | Discharge status: Home / Self Care | Payer: Medicare HMO | Source: Ambulatory Visit | Attending: Cardiovascular Disease

## 2015-02-15 ENCOUNTER — Other Ambulatory Visit: Payer: Self-pay

## 2015-02-15 ENCOUNTER — Ambulatory Visit (HOSPITAL_COMMUNITY)
Admission: RE | Admit: 2015-02-15 | Discharge: 2015-02-15 | Disposition: A | Discharge status: Home / Self Care | Payer: Medicare HMO | Source: Ambulatory Visit | Attending: Cardiovascular Disease | Admitting: Cardiovascular Disease

## 2015-02-15 DIAGNOSIS — R9439 Abnormal result of other cardiovascular function study: Secondary | ICD-10-CM | POA: Diagnosis present

## 2015-02-15 DIAGNOSIS — I129 Hypertensive chronic kidney disease with stage 1 through stage 4 chronic kidney disease, or unspecified chronic kidney disease: Secondary | ICD-10-CM | POA: Diagnosis not present

## 2015-02-15 DIAGNOSIS — N183 Chronic kidney disease, stage 3 (moderate): Secondary | ICD-10-CM | POA: Diagnosis not present

## 2015-02-15 DIAGNOSIS — R0789 Other chest pain: Secondary | ICD-10-CM | POA: Diagnosis not present

## 2015-02-15 DIAGNOSIS — M179 Osteoarthritis of knee, unspecified: Secondary | ICD-10-CM | POA: Insufficient documentation

## 2015-02-15 DIAGNOSIS — Z7951 Long term (current) use of inhaled steroids: Secondary | ICD-10-CM | POA: Diagnosis not present

## 2015-02-15 DIAGNOSIS — K829 Disease of gallbladder, unspecified: Secondary | ICD-10-CM | POA: Diagnosis not present

## 2015-02-15 DIAGNOSIS — N400 Enlarged prostate without lower urinary tract symptoms: Secondary | ICD-10-CM | POA: Diagnosis not present

## 2015-02-15 DIAGNOSIS — Z87891 Personal history of nicotine dependence: Secondary | ICD-10-CM | POA: Diagnosis not present

## 2015-02-15 DIAGNOSIS — K219 Gastro-esophageal reflux disease without esophagitis: Secondary | ICD-10-CM | POA: Diagnosis not present

## 2015-02-15 DIAGNOSIS — I9589 Other hypotension: Secondary | ICD-10-CM | POA: Insufficient documentation

## 2015-02-15 DIAGNOSIS — E785 Hyperlipidemia, unspecified: Secondary | ICD-10-CM | POA: Insufficient documentation

## 2015-02-15 DIAGNOSIS — I2510 Atherosclerotic heart disease of native coronary artery without angina pectoris: Secondary | ICD-10-CM

## 2015-02-15 DIAGNOSIS — M1611 Unilateral primary osteoarthritis, right hip: Secondary | ICD-10-CM | POA: Insufficient documentation

## 2015-02-15 DIAGNOSIS — Z96649 Presence of unspecified artificial hip joint: Secondary | ICD-10-CM | POA: Diagnosis not present

## 2015-02-15 DIAGNOSIS — Z96653 Presence of artificial knee joint, bilateral: Secondary | ICD-10-CM | POA: Insufficient documentation

## 2015-02-15 DIAGNOSIS — J449 Chronic obstructive pulmonary disease, unspecified: Secondary | ICD-10-CM | POA: Diagnosis not present

## 2015-02-15 DIAGNOSIS — Z7982 Long term (current) use of aspirin: Secondary | ICD-10-CM | POA: Diagnosis not present

## 2015-02-15 DIAGNOSIS — Z8249 Family history of ischemic heart disease and other diseases of the circulatory system: Secondary | ICD-10-CM | POA: Diagnosis not present

## 2015-02-15 HISTORY — PX: CARDIAC CATHETERIZATION: SHX172

## 2015-02-15 SURGERY — LEFT HEART CATH AND CORONARY ANGIOGRAPHY
Anesthesia: LOCAL

## 2015-02-15 MED ORDER — LIDOCAINE HCL (PF) 1 % IJ SOLN
INTRAMUSCULAR | Status: DC | PRN
  Administered 2015-02-15: 18:00:00

## 2015-02-15 MED ORDER — RADIAL COCKTAIL (HEPARIN/VERAPAMIL/LIDOCAINE/NITRO)
Status: DC | PRN
  Administered 2015-02-15: 1 via INTRA_ARTERIAL

## 2015-02-15 MED ORDER — SODIUM CHLORIDE 0.9 % IV SOLN: 250.0000 mL | INTRAVENOUS | Status: DC | PRN

## 2015-02-15 MED ORDER — SODIUM CHLORIDE 0.9 % WEIGHT BASED INFUSION
3.0000 mL/kg/h | INTRAVENOUS | Status: AC
  Administered 2015-02-15: 3 mL/kg/h via INTRAVENOUS

## 2015-02-15 MED ORDER — SODIUM CHLORIDE 0.9 % WEIGHT BASED INFUSION
1.0000 mL/kg/h | INTRAVENOUS | Status: DC
  Administered 2015-02-15: 1 mL/kg/h via INTRAVENOUS

## 2015-02-15 MED ORDER — MIDAZOLAM HCL 2 MG/2ML IJ SOLN
INTRAMUSCULAR | Status: DC | PRN
  Administered 2015-02-15: 2 mg via INTRAVENOUS

## 2015-02-15 MED ORDER — SODIUM CHLORIDE 0.9 % IJ SOLN: 3.0000 mL | INTRAMUSCULAR | Status: DC | PRN

## 2015-02-15 MED ORDER — SODIUM CHLORIDE 0.9 % WEIGHT BASED INFUSION: 3.0000 mL/kg/h | INTRAVENOUS | Status: DC

## 2015-02-15 MED ORDER — HEPARIN (PORCINE) IN NACL 2-0.9 UNIT/ML-% IJ SOLN
INTRAMUSCULAR | Status: AC
  Filled 2015-02-15: qty 1000

## 2015-02-15 MED ORDER — SODIUM CHLORIDE 0.9 % IV SOLN: INTRAVENOUS | Status: DC | PRN

## 2015-02-15 MED ORDER — ISOSORBIDE MONONITRATE ER 30 MG PO TB24: 30.0000 mg | ORAL_TABLET | Freq: Every day | ORAL | Status: DC

## 2015-02-15 MED ORDER — VERAPAMIL HCL 2.5 MG/ML IV SOLN
INTRAVENOUS | Status: AC
  Filled 2015-02-15: qty 2

## 2015-02-15 MED ORDER — HEPARIN SODIUM (PORCINE) 1000 UNIT/ML IJ SOLN
INTRAMUSCULAR | Status: AC
  Filled 2015-02-15: qty 1

## 2015-02-15 MED ORDER — FENTANYL CITRATE (PF) 100 MCG/2ML IJ SOLN
INTRAMUSCULAR | Status: DC | PRN
  Administered 2015-02-15: 50 ug via INTRAVENOUS

## 2015-02-15 MED ORDER — ONDANSETRON HCL 4 MG/2ML IJ SOLN: 4.0000 mg | Freq: Four times a day (QID) | INTRAMUSCULAR | Status: DC | PRN

## 2015-02-15 MED ORDER — ASPIRIN 81 MG PO CHEW
CHEWABLE_TABLET | ORAL | Status: AC
  Filled 2015-02-15: qty 1

## 2015-02-15 MED ORDER — NITROGLYCERIN 1 MG/10 ML FOR IR/CATH LAB
INTRA_ARTERIAL | Status: AC
  Filled 2015-02-15: qty 10

## 2015-02-15 MED ORDER — IOHEXOL 350 MG/ML SOLN
INTRAVENOUS | Status: DC | PRN
  Administered 2015-02-15: 80 mL via INTRA_ARTERIAL

## 2015-02-15 MED ORDER — ACETAMINOPHEN 325 MG PO TABS: 650.0000 mg | ORAL_TABLET | ORAL | Status: DC | PRN

## 2015-02-15 MED ORDER — ASPIRIN 81 MG PO TABS: 81.0000 mg | ORAL_TABLET | Freq: Every day | ORAL | Status: DC

## 2015-02-15 MED ORDER — METOPROLOL TARTRATE 25 MG PO TABS: 12.5000 mg | ORAL_TABLET | Freq: Two times a day (BID) | ORAL | Status: DC

## 2015-02-15 MED ORDER — ASPIRIN 81 MG PO CHEW
81.0000 mg | CHEWABLE_TABLET | ORAL | Status: AC
  Administered 2015-02-15: 81 mg via ORAL

## 2015-02-15 MED ORDER — MIDAZOLAM HCL 2 MG/2ML IJ SOLN
INTRAMUSCULAR | Status: AC
  Filled 2015-02-15: qty 2

## 2015-02-15 MED ORDER — LIDOCAINE HCL (PF) 1 % IJ SOLN
INTRAMUSCULAR | Status: AC
  Filled 2015-02-15: qty 30

## 2015-02-15 MED ORDER — SODIUM CHLORIDE 0.9 % IJ SOLN: 3.0000 mL | Freq: Two times a day (BID) | INTRAMUSCULAR | Status: DC

## 2015-02-15 MED ORDER — FENTANYL CITRATE (PF) 100 MCG/2ML IJ SOLN
INTRAMUSCULAR | Status: AC
  Filled 2015-02-15: qty 2

## 2015-02-15 MED ORDER — HEPARIN SODIUM (PORCINE) 1000 UNIT/ML IJ SOLN
INTRAMUSCULAR | Status: DC | PRN
  Administered 2015-02-15: 5000 [IU] via INTRAVENOUS

## 2015-02-15 SURGICAL SUPPLY — 11 items
CATH INFINITI 5FR ANG PIGTAIL (CATHETERS) ×3 IMPLANT
CATH INFINITI JR4 5F (CATHETERS) ×3 IMPLANT
CATH OPTITORQUE TIG 4.0 5F (CATHETERS) ×3 IMPLANT
DEVICE RAD COMP TR BAND LRG (VASCULAR PRODUCTS) ×3 IMPLANT
GLIDESHEATH SLEND A-KIT 6F 22G (SHEATH) ×3 IMPLANT
KIT HEART LEFT (KITS) ×3 IMPLANT
PACK CARDIAC CATHETERIZATION (CUSTOM PROCEDURE TRAY) ×3 IMPLANT
SYR MEDRAD MARK V 150ML (SYRINGE) ×3 IMPLANT
TRANSDUCER W/STOPCOCK (MISCELLANEOUS) ×3 IMPLANT
TUBING CIL FLEX 10 FLL-RA (TUBING) ×3 IMPLANT
WIRE SAFE-T 1.5MM-J .035X260CM (WIRE) ×3 IMPLANT

## 2015-02-15 NOTE — Discharge Instructions (Signed)
Radial Site Care °Refer to this sheet in the next few weeks. These instructions provide you with information on caring for yourself after your procedure. Your caregiver may also give you more specific instructions. Your treatment has been planned according to current medical practices, but problems sometimes occur. Call your caregiver if you have any problems or questions after your procedure. °HOME CARE INSTRUCTIONS °· You may shower the day after the procedure. Remove the bandage (dressing) and gently wash the site with plain soap and water. Gently pat the site dry. °· Do not apply powder or lotion to the site. °· Do not submerge the affected site in water for 3 to 5 days. °· Inspect the site at least twice daily. °· Do not flex or bend the affected arm for 24 hours. °· No lifting over 5 pounds (2.3 kg) for 5 days after your procedure. °· Do not drive home if you are discharged the same day of the procedure. Have someone else drive you. °· You may drive 24 hours after the procedure unless otherwise instructed by your caregiver. °· Do not operate machinery or power tools for 24 hours. °· A responsible adult should be with you for the first 24 hours after you arrive home. °What to expect: °· Any bruising will usually fade within 1 to 2 weeks. °· Blood that collects in the tissue (hematoma) may be painful to the touch. It should usually decrease in size and tenderness within 1 to 2 weeks. °SEEK IMMEDIATE MEDICAL CARE IF: °· You have unusual pain at the radial site. °· You have redness, warmth, swelling, or pain at the radial site. °· You have drainage (other than a small amount of blood on the dressing). °· You have chills. °· You have a fever or persistent symptoms for more than 72 hours. °· You have a fever and your symptoms suddenly get worse. °· Your arm becomes pale, cool, tingly, or numb. °· You have heavy bleeding from the site. Hold pressure on the site. °Document Released: 08/10/2010 Document Revised:  09/30/2011 Document Reviewed: 08/10/2010 °ExitCare® Patient Information ©2015 [employer]. This information is not intended to replace advice given to you by your health care provider. Make sure you discuss any questions you have with your health care provider. ° °

## 2015-02-15 NOTE — Interval H&P Note (Signed)
Cath Lab Visit (complete for each Cath Lab visit)  Clinical Evaluation Leading to the Procedure:   ACS: No.  Non-ACS:    Anginal Classification: CCS III  Anti-ischemic medical therapy: Minimal Therapy (1 class of medications)  Non-Invasive Test Results: Intermediate-risk stress test findings: cardiac mortality 1-3%/year  Prior CABG: No previous CABG      History and Physical Interval Note:  02/15/2015 5:09 PM  Preston Boyd  has presented today for surgery, with the diagnosis of c/p  The various methods of treatment have been discussed with the patient and family. After consideration of risks, benefits and other options for treatment, the patient has consented to  Procedure(s): Left Heart Cath and Coronary Angiography (N/A) as a surgical intervention .  The patient's history has been reviewed, patient examined, no change in status, stable for surgery.  I have reviewed the patient's chart and labs.  Questions were answered to the patient's satisfaction.     KELLY,THOMAS A

## 2015-02-15 NOTE — H&P (View-Only) (Signed)
Cardiology Office Note   Date:  02/13/2015   ID:  Preston Boyd, DOB [birth year], MRN [id number]  PCP:  Preston Paci, MD  Cardiologist:  Dr. Alwyn Boyd    Chief Complaint  Patient presents with  . Shortness of Breath      History of Present Illness: Preston Boyd is a 74 y.o. male who presents for cardiac evaluation after seen by his PCP for exertional chest pain.   Most the pain is rt upper shoulder with exertion and resolves with rest.  Occurs with walking the dog.  Occurs about half to 3/4 mile into walk.  Also he has had chest discomfort as well.  He initially wanted to think this was his GB- known gallstones for 20 years.  occ attack.  But he was concerned due to more increasing episodes of the discomfort.  No previous hx of CAD.  Dr. Alwyn Boyd ordered ETT and he had about 1 mm changes and it was then recommended to have lexiscan myoview which he did have. See Results below.        Nuclear stress EF: 67%.  There was no ST segment deviation noted during stress.  Defect 1: There is a medium defect of moderate severity present in the mid inferoseptal, mid inferior and apical inferior location.  This is an intermediate risk study.  Findings consistent with ischemia.  The left ventricular ejection fraction is normal (55-65%).  There is a medium size, moderate intensity perfusion defect in the mid and apical inferior and mid inferolateral walls. Basal inferior and inferolateral segments are affected by significant extracardiac activity uptake and can't be evaluated.  There is also exertional hypotension and TID suggestive of ischemia.  He has family Hx CAD, HTN and dyslipidemia.     Due to +results,  I saw the pt along with Dr. Johney Boyd and we discussed cardiac cath with the pt. With his symptoms and positive study we recommend cath.   Pt was agreeable.   Past Medical History  Diagnosis Date  . DEGENERATIVE JOINT DISEASE, RIGHT HIP     s/p THR 11/2008  . Osteoarthritis  of knee     s/p B TKR 07/2009  . GERD   . Hypertension   . RENAL INSUFFICIENCY   . BENIGN PROSTATIC HYPERTROPHY   . MIGRAINE HEADACHE   . COPD (chronic obstructive pulmonary disease)   . CKD (chronic kidney disease) stage 3, GFR 30-59 ml/min   . History of cardiovascular stress test     Lexiscan Myoview 7/16:  Inferoseptal, inferior and apical ischemia, EF 67%, TID 1.09; intermediate risk    Past Surgical History  Procedure Laterality Date  . Tonsillectomy    . Total hip arthroplasty  11/2008    Dr. Luiz Boyd  . Replacement total knee bilateral  07/2009    Preston Boyd  . Cataract extraction, bilateral  10/2012     Current Outpatient Prescriptions  Medication Sig Dispense Refill  . acetaminophen (TYLENOL) 500 MG tablet Take 500 mg by mouth every 6 (six) hours as needed for moderate pain.     Marland Kitchen aspirin 325 MG tablet Take 325 mg by mouth daily.      . Flaxseed, Linseed, (FLAXSEED OIL) 1000 MG CAPS Take by mouth 2 (two) times daily.      . Fluticasone-Salmeterol (ADVAIR DISKUS) 250-50 MCG/DOSE AEPB Inhale 1 puff into the lungs 2 (two) times daily. 60 each 3  . GuaiFENesin (MUCINEX PO) Take 1 tablet by mouth as needed (CONGESTION).     Marland Kitchen  ibuprofen (ADVIL,MOTRIN) 200 MG tablet Take 200 mg by mouth as needed for moderate pain.     Marland Kitchen lisinopril-hydrochlorothiazide (PRINZIDE,ZESTORETIC) 20-25 MG per tablet 1 tab PO daily 90 tablet 3   No current facility-administered medications for this visit.    Allergies:   Other    Social History:  The patient  reports that he quit smoking about 52 years ago. He has never used smokeless tobacco. He reports that he drinks alcohol. He reports that he does not use illicit drugs.   Family History:  The patient's family history includes Alcohol abuse in his other; Arthritis in his other; Colon cancer in his mother; Diabetes in his other; Heart attack in his maternal uncle; Heart disease in his other; Hypertension in his other; Lung cancer in his father; Stroke in  his paternal grandfather.    ROS:  General:no colds or fevers, no weight changes Skin:no rashes or ulcers HEENT:no blurred vision, no congestion CV:see HPI PUL:see HPI GI:no diarrhea constipation or melena, no indigestion- occ GI GB flare GU:no hematuria, no dysuria MS:no joint pain, no claudication Neuro:hx  Syncope- last episode 2 years ago- occurs with giving blood , significant pain- vasovagal , no lightheadedness Boyd:no diabetes, no thyroid disease  Wt Readings from Last 3 Encounters:  02/13/15 225 lb (102.059 kg)  02/08/15 225 lb (102.059 kg)  12/01/14 229 lb 4 oz (103.987 kg)     PHYSICAL EXAM: VS:  BP 130/62 mmHg  Pulse 66  Ht 6\' 2"  (1.88 m)  Wt 225 lb (102.059 kg)  BMI 28.88 kg/m2 , BMI Body mass index is 28.88 kg/(m^2). General:Pleasant affect, NAD Skin:Warm and dry, brisk capillary refill HEENT:normocephalic, sclera clear, mucus membranes moist Neck:supple, no JVD, no bruits  Heart:S1S2 RRR without murmur, gallup, rub or click Lungs:clear without rales, rhonchi, or wheezes WUJ:WJXB, non tender, + BS, do not palpate liver spleen or masses Ext:no lower ext edema, 2+ pedal pulses, 2+ radial pulses Neuro:alert and oriented X 3, MAE, follows commands, + facial symmetry    EKG:  EKG is ordered today. The ekg ordered today demonstrates SR no acute changes, normal EKG.   Recent Labs: 05/26/2014: Hemoglobin 16.4; Platelets 195.0; TSH 2.03 12/02/2014: BUN 29*; Creatinine, Ser 1.99*; Potassium 5.0; Sodium 134*    Lipid Panel    Component Value Date/Time   CHOL 162 12/02/2014 0806   CHOL 181 05/26/2014 0832   TRIG 124 12/02/2014 0806   TRIG 95.0 05/26/2014 0832   HDL 36* 12/02/2014 0806   HDL 32.90* 05/26/2014 0832   CHOLHDL 6 05/26/2014 0832   VLDL 19.0 05/26/2014 0832   LDLCALC 101* 12/02/2014 0806   LDLCALC 129* 05/26/2014 0832       Other studies Reviewed: Additional studies/ records that were reviewed today include: reviewed NMR nuc and ETT, PCP  note. .   ASSESSMENT AND PLAN:  1.  cresendo angina with abnormal nuc study with inf/lat ischemia.  Will plan cardiac cath this week with Dr. Tresa Boyd and then Dr. Tresa Boyd will be his primary cardiologist.  We discussed and the patient understands that risks included but are not limited to stroke (1 in 1000), death (1 in 1000), kidney failure [usually temporary] (1 in 500), bleeding (1 in 200), allergic reaction [possibly serious] (1 in 200).  He agrees to proceed.  2. CKD- will check labs and plan for pt to come to Short stay 5-6 hours before procedure for hydration.  We will hol ibuprofen and lisinopril hctz on Tuesday and Wed. Before the study.  3. Hyperlipidemia, will treat depending on cardiac cath.   4. HTN controlled.   Dr. Johney Boyd saw and assessed the pt and agreed with plan.      Current medicines are reviewed with the patient today.  The patient Has no concerns regarding medicines.  The following changes have been made:  See above Labs/ tests ordered today include:see above  Disposition:   FU:  see above  Signed, Leone Brand, NP  02/13/2015 11:08 AM    [facility] [address] [place], [place], [place]  [address]/ [address] [employer] [address] [place], [place] Phone: [phone]; Fax: [phone]  [phone]  I have seen, examined the patient, and reviewed the above assessment and plan.  Changes to above are made where necessary.    Co Sign: Hillis Range, MD 02/13/2015 9:42 PM

## 2015-02-16 ENCOUNTER — Encounter (HOSPITAL_COMMUNITY): Payer: Self-pay | Admitting: Cardiovascular Disease

## 2015-02-16 ENCOUNTER — Telehealth: Payer: Self-pay

## 2015-02-16 MED FILL — Verapamil HCl IV Soln 2.5 MG/ML: INTRAVENOUS | Qty: 2 | Status: AC

## 2015-02-16 NOTE — Telephone Encounter (Signed)
TCM list  Procedure: Left Heart Cath completed DC: 02/15/15

## 2015-02-17 NOTE — Telephone Encounter (Signed)
See note below -  Pt to follow up Cardiology

## 2015-02-23 ENCOUNTER — Telehealth: Payer: Self-pay | Admitting: Internal Medicine

## 2015-02-23 DIAGNOSIS — Z0000 Encounter for general adult medical examination without abnormal findings: Secondary | ICD-10-CM

## 2015-02-23 NOTE — Telephone Encounter (Signed)
Patient has a physical on 11/21 and he was wondering if he can get his lab work done ahead of time. Please advise

## 2015-03-07 MED ORDER — LISINOPRIL-HYDROCHLOROTHIAZIDE 20-25 MG PO TABS: 0.5000 | ORAL_TABLET | Freq: Every day | ORAL | Status: DC

## 2015-03-07 MED ORDER — FLUTICASONE-SALMETEROL 250-50 MCG/DOSE IN AEPB: 1.0000 | INHALATION_SPRAY | Freq: Two times a day (BID) | RESPIRATORY_TRACT | Status: DC | PRN

## 2015-03-07 NOTE — Telephone Encounter (Signed)
Pt informed

## 2015-03-07 NOTE — Telephone Encounter (Signed)
Future labs entered as requested. Please let patient know these labs may not be paid for by insurance if done ahead of schedule appointment. It is patient's decision whether or not to do labs ahead of appointment if desired, or after to ensure coverage Thanks

## 2015-05-22 ENCOUNTER — Encounter: Payer: Medicare HMO | Admitting: Internal Medicine

## 2015-06-10 DIAGNOSIS — K810 Acute cholecystitis: Secondary | ICD-10-CM | POA: Diagnosis not present

## 2015-06-10 DIAGNOSIS — R1011 Right upper quadrant pain: Secondary | ICD-10-CM | POA: Diagnosis not present

## 2015-06-12 ENCOUNTER — Encounter: Payer: Self-pay | Admitting: Internal Medicine

## 2015-06-12 ENCOUNTER — Ambulatory Visit (INDEPENDENT_AMBULATORY_CARE_PROVIDER_SITE_OTHER): Payer: Medicare HMO | Admitting: Internal Medicine

## 2015-06-12 ENCOUNTER — Ambulatory Visit
Admission: RE | Admit: 2015-06-12 | Discharge: 2015-06-12 | Disposition: A | Discharge status: Home / Self Care | Payer: Medicare HMO | Source: Ambulatory Visit | Attending: Internal Medicine | Admitting: Internal Medicine

## 2015-06-12 ENCOUNTER — Other Ambulatory Visit (INDEPENDENT_AMBULATORY_CARE_PROVIDER_SITE_OTHER): Payer: Medicare HMO

## 2015-06-12 VITALS — BP 110/58 | HR 84 | Temp 97.7°F | Ht 74.0 in | Wt 226.8 lb

## 2015-06-12 DIAGNOSIS — Z0000 Encounter for general adult medical examination without abnormal findings: Secondary | ICD-10-CM | POA: Diagnosis not present

## 2015-06-12 DIAGNOSIS — Z23 Encounter for immunization: Secondary | ICD-10-CM | POA: Diagnosis not present

## 2015-06-12 DIAGNOSIS — I2510 Atherosclerotic heart disease of native coronary artery without angina pectoris: Secondary | ICD-10-CM | POA: Diagnosis not present

## 2015-06-12 DIAGNOSIS — I10 Essential (primary) hypertension: Secondary | ICD-10-CM

## 2015-06-12 DIAGNOSIS — N183 Chronic kidney disease, stage 3 unspecified: Secondary | ICD-10-CM

## 2015-06-12 DIAGNOSIS — K8050 Calculus of bile duct without cholangitis or cholecystitis without obstruction: Secondary | ICD-10-CM

## 2015-06-12 DIAGNOSIS — Z1211 Encounter for screening for malignant neoplasm of colon: Secondary | ICD-10-CM | POA: Diagnosis not present

## 2015-06-12 DIAGNOSIS — K8020 Calculus of gallbladder without cholecystitis without obstruction: Secondary | ICD-10-CM | POA: Diagnosis not present

## 2015-06-12 DIAGNOSIS — J449 Chronic obstructive pulmonary disease, unspecified: Secondary | ICD-10-CM

## 2015-06-12 LAB — BASIC METABOLIC PANEL
BUN: 43 mg/dL — ABNORMAL HIGH (ref 6–23)
CHLORIDE: 94 meq/L — AB (ref 96–112)
CO2: 25 meq/L (ref 19–32)
CREATININE: 2.67 mg/dL — AB (ref 0.40–1.50)
Calcium: 9.5 mg/dL (ref 8.4–10.5)
GFR: 25.04 mL/min — ABNORMAL LOW (ref >60.00)
Glucose, Bld: 88 mg/dL (ref 70–99)
Potassium: 4.2 mEq/L (ref 3.5–5.1)
Sodium: 131 mEq/L — ABNORMAL LOW (ref 135–145)

## 2015-06-12 LAB — URINALYSIS, ROUTINE W REFLEX MICROSCOPIC
Hgb urine dipstick: NEGATIVE
Ketones, ur: NEGATIVE
LEUKOCYTES UA: NEGATIVE
Nitrite: NEGATIVE
PH: 5 (ref 5.0–8.0)
RBC / HPF: NONE SEEN (ref 0–?)
TOTAL PROTEIN, URINE-UPE24: NEGATIVE
UROBILINOGEN UA: 0.2 (ref 0.0–1.0)
Urine Glucose: NEGATIVE

## 2015-06-12 LAB — LIPID PANEL
CHOL/HDL RATIO: 3
Cholesterol: 150 mg/dL (ref 0–200)
HDL: 57 mg/dL (ref >39.00)
LDL Cholesterol: 75 mg/dL (ref 0–99)
NONHDL: 93.31
TRIGLYCERIDES: 91 mg/dL (ref 0.0–149.0)
VLDL: 18.2 mg/dL (ref 0.0–40.0)

## 2015-06-12 LAB — HEPATIC FUNCTION PANEL
ALT: 44 U/L (ref 0–53)
AST: 20 U/L (ref 0–37)
Albumin: 3.9 g/dL (ref 3.5–5.2)
Alkaline Phosphatase: 55 U/L (ref 39–117)
BILIRUBIN DIRECT: 0.5 mg/dL — AB (ref 0.0–0.3)
TOTAL PROTEIN: 7.4 g/dL (ref 6.0–8.3)
Total Bilirubin: 1.8 mg/dL — ABNORMAL HIGH (ref 0.2–1.2)

## 2015-06-12 MED ORDER — METOPROLOL TARTRATE 25 MG PO TABS: 12.5000 mg | ORAL_TABLET | Freq: Two times a day (BID) | ORAL | Status: DC

## 2015-06-12 MED ORDER — ASPIRIN EC 325 MG PO TBEC: 325.0000 mg | DELAYED_RELEASE_TABLET | Freq: Every day | ORAL | Status: DC

## 2015-06-12 MED ORDER — ISOSORBIDE MONONITRATE ER 30 MG PO TB24: 30.0000 mg | ORAL_TABLET | Freq: Every day | ORAL | Status: DC

## 2015-06-12 NOTE — Assessment & Plan Note (Signed)
eval [facility] 06/10/15 for same - refer for US now Continue Zofran and Norco as rx'd by UC and complete Cipro course as ongoing

## 2015-06-12 NOTE — Assessment & Plan Note (Signed)
Stable CrCl - avoiding NSAIDs OTC Change ACEI/HCTZ and monitor off these meds Check Bmet and UA now

## 2015-06-12 NOTE — Assessment & Plan Note (Signed)
  BP Readings from Last 3 Encounters:  06/12/15 110/58  02/15/15 113/55  02/13/15 130/62   Stop low dose ACEI/hctz and start beta-blocker low dose and nitrates as rx'd by cards 01/2015 for med mgmt of mild CAD Pt will call if recurrent dizzy symptoms on combined tx, will plan to stop nitrate

## 2015-06-12 NOTE — Progress Notes (Signed)
Subjective:    Patient ID: Preston Boyd, male    DOB: [birth year], 74 y.o.   MRN: [id number]  HPI   Here for medicare wellness  Diet: heart healthy or DM if diabetic Physical activity: sedentary Depression/mood screen: negative Hearing: intact to whispered voice Visual acuity: grossly normal, performs annual eye exam  ADLs: capable Fall risk: none Home safety: good Cognitive evaluation: intact to orientation, naming, recall and repetition EOL planning: adv directives, full code/ I agree  I have personally reviewed and have noted 1. The patient's medical and social history 2. Their use of alcohol, tobacco or illicit drugs 3. Their current medications and supplements 4. The patient's functional ability including ADL's, fall risks, home safety risks and hearing or visual impairment. 5. Diet and physical activities 6. Evidence for depression or mood disorders  Also reviewed chronic medical conditions, interval events and current concerns Reviewed confusion over medications following cardiac catheterization recommendation and ongoing biliary colic symptoms following urgent care evaluation for same 48 hours ago  Past Medical History  Diagnosis Date  . DEGENERATIVE JOINT DISEASE, RIGHT HIP     s/p THR 11/2008  . Osteoarthritis of knee     s/p B TKR 07/2009  . GERD   . Hypertension   . RENAL INSUFFICIENCY   . BENIGN PROSTATIC HYPERTROPHY   . MIGRAINE HEADACHE   . COPD (chronic obstructive pulmonary disease) (HCC)   . CKD (chronic kidney disease) stage 3, GFR 30-59 ml/min   . History of cardiovascular stress test 01/2015    Lexiscan Myoview 7/16:  Inferoseptal, inferior and apical ischemia, EF 67%, TID 1.09; intermediate risk  . CAD (coronary artery disease) 01/2015    mild on cath 01/2015, med mgmt rec   Family History  Problem Relation Age of Onset  . Colon cancer Mother   . Lung cancer Father   . Alcohol abuse Other   . Arthritis Other   . Diabetes Other   .  Hypertension Other   . Heart disease Other   . Heart attack Maternal Uncle   . Stroke Paternal Grandfather    Social History  Substance Use Topics  . Smoking status: Former Smoker    Quit date: 07/22/1962  . Smokeless tobacco: Never Used  . Alcohol Use: 0.0 oz/week    0 Standard drinks or equivalent per week     Comment: ocassionally    Review of Systems  Constitutional: Negative for fever, activity change, appetite change, fatigue and unexpected weight change.  Respiratory: Negative for cough, chest tightness, shortness of breath and wheezing.   Cardiovascular: Negative for chest pain, palpitations and leg swelling.  Gastrointestinal: Positive for vomiting (dry heaves with "attacks", none now) and abdominal pain (RUQ "attacks", ?gallstones as told h/o same in 1990s but never pursued surg - eval by [facility] 11/19 for same: on zofran, norco and cipro x 2 days). Negative for nausea, diarrhea and abdominal distention.  Genitourinary: Negative for urgency. Hematuria: ?"dark orange" x 48h.  Neurological: Negative for dizziness, weakness and headaches.  Psychiatric/Behavioral: Negative for dysphoric mood. The patient is not nervous/anxious.   All other systems reviewed and are negative.  Patient Care Team: Newt Lukes, MD as PCP - General Jodi Geralds, MD as Consulting Physician (Orthopedic Surgery) Estrella Deeds, OD as Physician Assistant (Optometry) [facility] Harle Stanford., MD as Consulting Physician (Urology) Lennette Bihari, MD (Cardiology)     Objective:    Physical Exam  Constitutional: He is oriented to person, place, and  time. He appears well-developed and well-nourished. No distress.  Cardiovascular: Normal rate, regular rhythm and normal heart sounds.   No murmur heard. Pulmonary/Chest: Effort normal and breath sounds normal. No respiratory distress.  Abdominal: Soft. Bowel sounds are normal. There is tenderness (RUQ). There is no rebound and no guarding.    Neurological: He is alert and oriented to person, place, and time. He has normal reflexes. Coordination normal.  Skin: Skin is warm and dry. No rash noted. No erythema. No pallor.  Psychiatric: He has a normal mood and affect. His behavior is normal. Judgment and thought content normal.    BP 110/58 mmHg  Pulse 84  Temp(Src) 97.7 F (36.5 C) (Oral)  Ht  (1.88 m)  Wt 226 lb 12 oz (102.853 kg)  BMI 29.10 kg/m2  SpO2 92% Wt Readings from Last 3 Encounters:  06/12/15 226 lb 12 oz (102.853 kg)  02/15/15 225 lb (102.059 kg)  02/13/15 225 lb (102.059 kg)     Lab Results  Component Value Date   WBC 8.4 02/13/2015   HGB 16.6 02/13/2015   HCT 49.5 02/13/2015   PLT 179.0 02/13/2015   GLUCOSE 84 02/13/2015   CHOL 162 12/02/2014   TRIG 124 12/02/2014   HDL 36* 12/02/2014   LDLCALC 101* 12/02/2014   ALT 18 05/21/2012   AST 20 05/21/2012   NA 139 02/13/2015   K 4.2 02/13/2015   CL 107 02/13/2015   CREATININE 1.77* 02/13/2015   BUN 18 02/13/2015   CO2 25 02/13/2015   TSH 2.03 05/26/2014   PSA 2.31 03/07/2011   INR 1.0 02/13/2015   HGBA1C 5.7 12/02/2014    No results found.     Assessment & Plan:   Z00.00/AWV/CPX - Today patient counseled on age appropriate routine health concerns for screening and prevention, each reviewed and up to date or declined. Immunizations reviewed and up to date or declined. Labs/ECG reviewed. Risk factors for depression reviewed and negative. Hearing function and visual acuity are intact. ADLs screened and addressed as needed. Functional ability and level of safety reviewed and appropriate. Education, counseling and referrals performed based on assessed risks today. Patient provided with a copy of personalized plan for preventive services.  Problem List Items Addressed This Visit    Biliary colic symptom    eval [facility] 06/10/15 for same - refer for [place] now Continue Zofran and Norco as rx'd by UC and complete Cipro course as ongoing       Relevant Orders   US Abdomen Complete   Basic metabolic panel   CBC with Differential/Platelet   Hepatic function panel   CAD (coronary artery disease)    Angina like symptoms 01/2015 prompting nuc stress test - abnormal prompting cath Cath with mild CAD and recommended med mgmt with beta-blocker and nitrates - but caused dizziness so stopped same indep Will stop ACEI/hctz and start beta-blocker and nitrate as recommended by cards - if dizzy, stop nitrate Continue full ASA 325 qd follow up cards prn      Relevant Medications   aspirin EC 325 MG tablet   metoprolol tartrate (LOPRESSOR) 25 MG tablet   isosorbide mononitrate (IMDUR) 30 MG 24 hr tablet   Other Relevant Orders   Lipid panel   CKD (chronic kidney disease) stage 3, GFR 30-59 ml/min    Stable CrCl - avoiding NSAIDs OTC Change ACEI/HCTZ and monitor off these meds Check Bmet and UA now       Relevant Orders   Basic metabolic  panel   Urinalysis, Routine w reflex microscopic ([facility])   COPD (chronic obstructive pulmonary disease) (HCC)    Seasonal symptoms - worse in cold weather Uses Advair fall-winter, declines need for Alb MDI ("those don't help") but confirms having same rx at home  Refill on same today      Hypertension     BP Readings from Last 3 Encounters:  06/12/15 110/58  02/15/15 113/55  02/13/15 130/62   Stop low dose ACEI/hctz and start beta-blocker low dose and nitrates as rx'd by cards 01/2015 for med mgmt of mild CAD Pt will call if recurrent dizzy symptoms on combined tx, will plan to stop nitrate      Relevant Medications   aspirin EC 325 MG tablet   metoprolol tartrate (LOPRESSOR) 25 MG tablet   isosorbide mononitrate (IMDUR) 30 MG 24 hr tablet   Other Relevant Orders   Basic metabolic panel   Lipid panel   Urinalysis, Routine w reflex microscopic (not at [facility])    Other Visit Diagnoses    Routine general medical examination at a health care facility    -  Primary        Rene PaciValerie  Danford Tat, MD

## 2015-06-12 NOTE — Assessment & Plan Note (Signed)
Angina like symptoms 01/2015 prompting nuc stress test - abnormal prompting cath Cath with mild CAD and recommended med mgmt with beta-blocker and nitrates - but caused dizziness so stopped same indep Will stop ACEI/hctz and start beta-blocker and nitrate as recommended by cards - if dizzy, stop nitrate Continue full ASA 325 qd follow up cards prn

## 2015-06-12 NOTE — Patient Instructions (Addendum)
It was good to see you today.  We have reviewed your prior records including labs and tests today  Flu shot administered today and referral for colonoscopy has been made - other Health Maintenance reviewed - all other recommended immunizations and age-appropriate screenings are up-to-date.  Test(s) ordered today. Your results will be released to MyChart (or called to you) after review, usually within 72hours after test completion. If any changes need to be made, you will be notified at that same time.  Medications reviewed and updated Stop lisinopril/hydrochlorothiazide for blood pressure Begin low-dose metoprolol half pill twice daily and 1 tablet of isosorbide once daily for blood pressure and to protect heart as recommended by cardiologist. Please call if recurrent dizziness symptoms No other medication changes recommended at this time.  We'll make referral for abdominal ultrasound to evaluate for gallstones. If abnormal test will refer to general surgeon for removal of gallbladder as discussed  If pain uncontrolled, if unable to take clear liquids, or if any fever, please go to the emergency room while we are evaluating for your gallbladder problems  We'll also schedule follow-up with your cardiologist as discussed the next several months  Please schedule followup in 6-12 months for annual exam and labs, call sooner if problems.  Health Maintenance, Male A healthy lifestyle and preventative care can promote health and wellness.  Maintain regular health, dental, and eye exams.  Eat a healthy diet. Foods like vegetables, fruits, whole grains, low-fat dairy products, and lean protein foods contain the nutrients you need and are low in calories. Decrease your intake of foods high in solid fats, added sugars, and salt. Get information about a proper diet from your health care provider, if necessary.  Regular physical exercise is one of the most important things you can do for your health.  Most adults should get at least 150 minutes of moderate-intensity exercise (any activity that increases your heart rate and causes you to sweat) each week. In addition, most adults need muscle-strengthening exercises on 2 or more days a week.   Maintain a healthy weight. The body mass index (BMI) is a screening tool to identify possible weight problems. It provides an estimate of body fat based on height and weight. Your health care provider can find your BMI and can help you achieve or maintain a healthy weight. For males 20 years and older:  A BMI below 18.5 is considered underweight.  A BMI of 18.5 to 24.9 is normal.  A BMI of 25 to 29.9 is considered overweight.  A BMI of 30 and above is considered obese.  Maintain normal blood lipids and cholesterol by exercising and minimizing your intake of saturated fat. Eat a balanced diet with plenty of fruits and vegetables. Blood tests for lipids and cholesterol should begin at age 9 and be repeated every 5 years. If your lipid or cholesterol levels are high, you are over age 34, or you are at high risk for heart disease, you may need your cholesterol levels checked more frequently.Ongoing high lipid and cholesterol levels should be treated with medicines if diet and exercise are not working.  If you smoke, find out from your health care provider how to quit. If you do not use tobacco, do not start.  Lung cancer screening is recommended for adults aged 55-80 years who are at high risk for developing lung cancer because of a history of smoking. A yearly low-dose CT scan of the lungs is recommended for people who have at least a  30-pack-year history of smoking and are current smokers or have quit within the past 15 years. A pack year of smoking is smoking an average of 1 pack of cigarettes a day for 1 year (for example, a 30-pack-year history of smoking could mean smoking 1 pack a day for 30 years or 2 packs a day for 15 years). Yearly screening should  continue until the smoker has stopped smoking for at least 15 years. Yearly screening should be stopped for people who develop a health problem that would prevent them from having lung cancer treatment.  If you choose to drink alcohol, do not have more than 2 drinks per day. One drink is considered to be 12 oz (360 mL) of beer, 5 oz (150 mL) of wine, or 1.5 oz (45 mL) of liquor.  Avoid the use of street drugs. Do not share needles with anyone. Ask for help if you need support or instructions about stopping the use of drugs.  High blood pressure causes heart disease and increases the risk of stroke. High blood pressure is more likely to develop in:  People who have blood pressure in the end of the normal range (100-139/85-89 mm Hg).  People who are overweight or obese.  People who are African American.  If you are 53-70 years of age, have your blood pressure checked every 3-5 years. If you are 51 years of age or older, have your blood pressure checked every year. You should have your blood pressure measured twice--once when you are at a hospital or clinic, and once when you are not at a hospital or clinic. Record the average of the two measurements. To check your blood pressure when you are not at a hospital or clinic, you can use:  An automated blood pressure machine at a pharmacy.  A home blood pressure monitor.  If you are 41-66 years old, ask your health care provider if you should take aspirin to prevent heart disease.  Diabetes screening involves taking a blood sample to check your fasting blood sugar level. This should be done once every 3 years after age 21 if you are at a normal weight and without risk factors for diabetes. Testing should be considered at a younger age or be carried out more frequently if you are overweight and have at least 1 risk factor for diabetes.  Colorectal cancer can be detected and often prevented. Most routine colorectal cancer screening begins at the age of  15 and continues through age 38. However, your health care provider may recommend screening at an earlier age if you have risk factors for colon cancer. On a yearly basis, your health care provider may provide home test kits to check for hidden blood in the stool. A small camera at the end of a tube may be used to directly examine the colon (sigmoidoscopy or colonoscopy) to detect the earliest forms of colorectal cancer. Talk to your health care provider about this at age 12 when routine screening begins. A direct exam of the colon should be repeated every 5-10 years through age 13, unless early forms of precancerous polyps or small growths are found.  People who are at an increased risk for hepatitis B should be screened for this virus. You are considered at high risk for hepatitis B if:  You were born in a country where hepatitis B occurs often. Talk with your health care provider about which countries are considered high risk.  Your parents were born in a high-risk country and  you have not received a shot to protect against hepatitis B (hepatitis B vaccine).  You have HIV or AIDS.  You use needles to inject street drugs.  You live with, or have sex with, someone who has hepatitis B.  You are a man who has sex with other men (MSM).  You get hemodialysis treatment.  You take certain medicines for conditions like cancer, organ transplantation, and autoimmune conditions.  Hepatitis C blood testing is recommended for all people born from [id number] through 1964 and any individual with known risk factors for hepatitis C.  Healthy men should no longer receive prostate-specific antigen (PSA) blood tests as part of routine cancer screening. Talk to your health care provider about prostate cancer screening.  Testicular cancer screening is not recommended for adolescents or adult males who have no symptoms. Screening includes self-exam, a health care provider exam, and other screening tests. Consult with  your health care provider about any symptoms you have or any concerns you have about testicular cancer.  Practice safe sex. Use condoms and avoid high-risk sexual practices to reduce the spread of sexually transmitted infections (STIs).  You should be screened for STIs, including gonorrhea and chlamydia if:  You are sexually active and are younger than 24 years.  You are older than 24 years, and your health care provider tells you that you are at risk for this type of infection.  Your sexual activity has changed since you were last screened, and you are at an increased risk for chlamydia or gonorrhea. Ask your health care provider if you are at risk.  If you are at risk of being infected with HIV, it is recommended that you take a prescription medicine daily to prevent HIV infection. This is called pre-exposure prophylaxis (PrEP). You are considered at risk if:  You are a man who has sex with other men (MSM).  You are a heterosexual man who is sexually active with multiple partners.  You take drugs by injection.  You are sexually active with a partner who has HIV.  Talk with your health care provider about whether you are at high risk of being infected with HIV. If you choose to begin PrEP, you should first be tested for HIV. You should then be tested every 3 months for as long as you are taking PrEP.  Use sunscreen. Apply sunscreen liberally and repeatedly throughout the day. You should seek shade when your shadow is shorter than you. Protect yourself by wearing long sleeves, pants, a wide-brimmed hat, and sunglasses year round whenever you are outdoors.  Tell your health care provider of new moles or changes in moles, especially if there is a change in shape or color. Also, tell your health care provider if a mole is larger than the size of a pencil eraser.  A one-time screening for abdominal aortic aneurysm (AAA) and surgical repair of large AAAs by ultrasound is recommended for men  aged 65-75 years who are current or former smokers.  Stay current with your vaccines (immunizations).   This information is not intended to replace advice given to you by your health care provider. Make sure you discuss any questions you have with your health care provider.   Document Released: 01/04/2008 Document Revised: 07/29/2014 Document Reviewed: 12/03/2010 Elsevier Interactive Patient Education 2016 [employer] Biliary Colic Biliary colic is a pain in the upper abdomen. The pain:  Is usually felt on the right side of the abdomen, but it may also be felt in the center  of the abdomen, just below the breastbone (sternum).  May spread back toward the right shoulder blade.  May be steady or irregular.  May be accompanied by nausea and vomiting. Most of the time, the pain goes away in 1-5 hours. After the most intense pain passes, the abdomen may continue to ache mildly for about 24 hours. Biliary colic is caused by a blockage in the bile duct. The bile duct is a pathway that carries bile--a liquid that helps to digest fats--from the gallbladder to the small intestine. Biliary colic usually occurs after eating, when the digestive system demands bile. The pain develops when muscle cells contract forcefully to try to move the blockage so that bile can get by. HOME CARE INSTRUCTIONS  Take medicines only as directed by your health care provider.  Drink enough fluid to keep your urine clear or pale yellow.  Avoid fatty, greasy, and fried foods. These kinds of foods increase your body's demand for bile.  Avoid any foods that make your pain worse.  Avoid overeating.  Avoid having a large meal after fasting. SEEK MEDICAL CARE IF:  You develop a fever.  Your pain gets worse.  You vomit.  You develop nausea that prevents you from eating and drinking. SEEK IMMEDIATE MEDICAL CARE IF:  You suddenly develop a fever and shaking chills.  You develop a yellowish discoloration  (jaundice) of:  Skin.  Whites of the eyes.  Mucous membranes.  You have continuous or severe pain that is not relieved with medicines.  You have nausea and vomiting that is not relieved with medicines.  You develop dizziness or you faint.   This information is not intended to replace advice given to you by your health care provider. Make sure you discuss any questions you have with your health care provider.   Document Released: 12/09/2005 Document Revised: 11/22/2014 Document Reviewed: 04/19/2014 Elsevier Interactive Patient Education [employer]

## 2015-06-12 NOTE — Assessment & Plan Note (Signed)
Seasonal symptoms - worse in cold weather Uses Advair fall-winter, declines need for Alb MDI ("those don't help") but confirms having same rx at home  Refill on same today

## 2015-06-13 ENCOUNTER — Other Ambulatory Visit: Payer: Self-pay

## 2015-06-13 ENCOUNTER — Telehealth: Payer: Self-pay | Admitting: Internal Medicine

## 2015-06-13 ENCOUNTER — Other Ambulatory Visit (HOSPITAL_COMMUNITY)
Admit: 2015-06-13 | Discharge: 2015-06-13 | Disposition: A | Discharge status: Home / Self Care | Payer: Medicare HMO | Source: Ambulatory Visit | Attending: Internal Medicine | Admitting: Internal Medicine

## 2015-06-13 ENCOUNTER — Inpatient Hospital Stay (HOSPITAL_COMMUNITY)
Admission: AD | Admit: 2015-06-13 | Discharge: 2015-06-17 | DRG: 418 | Disposition: A | Discharge status: Home / Self Care | Payer: Medicare HMO | Source: Ambulatory Visit | Attending: Family Medicine | Admitting: Family Medicine

## 2015-06-13 ENCOUNTER — Inpatient Hospital Stay (HOSPITAL_COMMUNITY): Payer: Medicare HMO

## 2015-06-13 ENCOUNTER — Encounter (HOSPITAL_COMMUNITY): Payer: Self-pay

## 2015-06-13 DIAGNOSIS — N179 Acute kidney failure, unspecified: Secondary | ICD-10-CM | POA: Diagnosis present

## 2015-06-13 DIAGNOSIS — R1011 Right upper quadrant pain: Secondary | ICD-10-CM | POA: Diagnosis not present

## 2015-06-13 DIAGNOSIS — I2510 Atherosclerotic heart disease of native coronary artery without angina pectoris: Secondary | ICD-10-CM | POA: Diagnosis not present

## 2015-06-13 DIAGNOSIS — K819 Cholecystitis, unspecified: Secondary | ICD-10-CM | POA: Diagnosis not present

## 2015-06-13 DIAGNOSIS — I10 Essential (primary) hypertension: Secondary | ICD-10-CM | POA: Diagnosis present

## 2015-06-13 DIAGNOSIS — E860 Dehydration: Secondary | ICD-10-CM | POA: Diagnosis not present

## 2015-06-13 DIAGNOSIS — Z8249 Family history of ischemic heart disease and other diseases of the circulatory system: Secondary | ICD-10-CM

## 2015-06-13 DIAGNOSIS — K8000 Calculus of gallbladder with acute cholecystitis without obstruction: Principal | ICD-10-CM | POA: Diagnosis present

## 2015-06-13 DIAGNOSIS — K8050 Calculus of bile duct without cholangitis or cholecystitis without obstruction: Secondary | ICD-10-CM | POA: Diagnosis not present

## 2015-06-13 DIAGNOSIS — Z01810 Encounter for preprocedural cardiovascular examination: Secondary | ICD-10-CM

## 2015-06-13 DIAGNOSIS — Z800 Family history of malignant neoplasm of digestive organs: Secondary | ICD-10-CM

## 2015-06-13 DIAGNOSIS — K8010 Calculus of gallbladder with chronic cholecystitis without obstruction: Secondary | ICD-10-CM | POA: Diagnosis not present

## 2015-06-13 DIAGNOSIS — N400 Enlarged prostate without lower urinary tract symptoms: Secondary | ICD-10-CM | POA: Diagnosis present

## 2015-06-13 DIAGNOSIS — N183 Chronic kidney disease, stage 3 unspecified: Secondary | ICD-10-CM | POA: Diagnosis present

## 2015-06-13 DIAGNOSIS — J449 Chronic obstructive pulmonary disease, unspecified: Secondary | ICD-10-CM | POA: Diagnosis not present

## 2015-06-13 DIAGNOSIS — R0602 Shortness of breath: Secondary | ICD-10-CM | POA: Diagnosis not present

## 2015-06-13 DIAGNOSIS — R17 Unspecified jaundice: Secondary | ICD-10-CM | POA: Diagnosis present

## 2015-06-13 DIAGNOSIS — E7800 Pure hypercholesterolemia, unspecified: Secondary | ICD-10-CM | POA: Diagnosis present

## 2015-06-13 DIAGNOSIS — Z87891 Personal history of nicotine dependence: Secondary | ICD-10-CM | POA: Diagnosis not present

## 2015-06-13 DIAGNOSIS — E669 Obesity, unspecified: Secondary | ICD-10-CM | POA: Diagnosis present

## 2015-06-13 DIAGNOSIS — Z6828 Body mass index (BMI) 28.0-28.9, adult: Secondary | ICD-10-CM

## 2015-06-13 DIAGNOSIS — Z419 Encounter for procedure for purposes other than remedying health state, unspecified: Secondary | ICD-10-CM

## 2015-06-13 DIAGNOSIS — E785 Hyperlipidemia, unspecified: Secondary | ICD-10-CM | POA: Diagnosis present

## 2015-06-13 DIAGNOSIS — Z96649 Presence of unspecified artificial hip joint: Secondary | ICD-10-CM | POA: Diagnosis present

## 2015-06-13 DIAGNOSIS — K8001 Calculus of gallbladder with acute cholecystitis with obstruction: Secondary | ICD-10-CM

## 2015-06-13 DIAGNOSIS — N1832 Chronic kidney disease, stage 3b: Secondary | ICD-10-CM | POA: Diagnosis present

## 2015-06-13 DIAGNOSIS — I129 Hypertensive chronic kidney disease with stage 1 through stage 4 chronic kidney disease, or unspecified chronic kidney disease: Secondary | ICD-10-CM | POA: Diagnosis present

## 2015-06-13 DIAGNOSIS — E871 Hypo-osmolality and hyponatremia: Secondary | ICD-10-CM | POA: Diagnosis not present

## 2015-06-13 DIAGNOSIS — R338 Other retention of urine: Secondary | ICD-10-CM | POA: Diagnosis present

## 2015-06-13 DIAGNOSIS — R05 Cough: Secondary | ICD-10-CM | POA: Diagnosis not present

## 2015-06-13 DIAGNOSIS — K812 Acute cholecystitis with chronic cholecystitis: Secondary | ICD-10-CM | POA: Diagnosis not present

## 2015-06-13 DIAGNOSIS — K219 Gastro-esophageal reflux disease without esophagitis: Secondary | ICD-10-CM | POA: Diagnosis present

## 2015-06-13 DIAGNOSIS — N401 Enlarged prostate with lower urinary tract symptoms: Secondary | ICD-10-CM | POA: Diagnosis present

## 2015-06-13 DIAGNOSIS — Z9049 Acquired absence of other specified parts of digestive tract: Secondary | ICD-10-CM | POA: Diagnosis not present

## 2015-06-13 DIAGNOSIS — R109 Unspecified abdominal pain: Secondary | ICD-10-CM | POA: Diagnosis present

## 2015-06-13 HISTORY — DX: Acute cholecystitis with chronic cholecystitis: K81.2

## 2015-06-13 LAB — CBC WITH DIFFERENTIAL/PLATELET
Basophils Absolute: 0 10*3/uL (ref 0.0–0.1)
Basophils Relative: 0 %
EOS PCT: 0 %
Eosinophils Absolute: 0 10*3/uL (ref 0.0–0.7)
HEMATOCRIT: 48.9 % (ref 39.0–52.0)
Hemoglobin: 16.3 g/dL (ref 13.0–17.0)
LYMPHS ABS: 1.7 10*3/uL (ref 0.7–4.0)
Lymphocytes Relative: 11 %
MCH: 31.2 pg (ref 26.0–34.0)
MCHC: 33.3 g/dL (ref 30.0–36.0)
MCV: 93.5 fL (ref 78.0–100.0)
MONOS PCT: 10 %
Monocytes Absolute: 1.5 10*3/uL — ABNORMAL HIGH (ref 0.1–1.0)
NEUTROS ABS: 12.2 10*3/uL — AB (ref 1.7–7.7)
Neutrophils Relative %: 79 %
Platelets: UNDETERMINED 10*3/uL (ref 150–400)
RBC: 5.23 MIL/uL (ref 4.22–5.81)
RDW: 13.4 % (ref 11.5–15.5)
WBC: 15.4 10*3/uL — ABNORMAL HIGH (ref 4.0–10.5)

## 2015-06-13 LAB — LIPASE, BLOOD: LIPASE: 30 U/L (ref 11–51)

## 2015-06-13 MED ORDER — METRONIDAZOLE 500 MG PO TABS
500.0000 mg | ORAL_TABLET | Freq: Three times a day (TID) | ORAL | Status: DC
  Administered 2015-06-13 – 2015-06-17 (×12): 500 mg via ORAL
  Filled 2015-06-13 (×15): qty 1

## 2015-06-13 MED ORDER — ACETAMINOPHEN 325 MG PO TABS
650.0000 mg | ORAL_TABLET | Freq: Four times a day (QID) | ORAL | Status: DC | PRN
  Administered 2015-06-17: 650 mg via ORAL
  Filled 2015-06-13: qty 2

## 2015-06-13 MED ORDER — ISOSORBIDE MONONITRATE ER 30 MG PO TB24
30.0000 mg | ORAL_TABLET | Freq: Every day | ORAL | Status: DC
  Administered 2015-06-14 – 2015-06-17 (×4): 30 mg via ORAL
  Filled 2015-06-13 (×5): qty 1

## 2015-06-13 MED ORDER — ONDANSETRON HCL 4 MG/2ML IJ SOLN
4.0000 mg | Freq: Four times a day (QID) | INTRAMUSCULAR | Status: DC | PRN
  Administered 2015-06-16: 4 mg via INTRAVENOUS
  Filled 2015-06-13: qty 2

## 2015-06-13 MED ORDER — ONDANSETRON HCL 4 MG PO TABS: 4.0000 mg | ORAL_TABLET | Freq: Four times a day (QID) | ORAL | Status: DC | PRN

## 2015-06-13 MED ORDER — SODIUM CHLORIDE 0.9 % IV SOLN
INTRAVENOUS | Status: DC
  Administered 2015-06-13: 18:00:00 via INTRAVENOUS
  Administered 2015-06-14: 1000 mL via INTRAVENOUS
  Administered 2015-06-15: 01:00:00 via INTRAVENOUS
  Administered 2015-06-15: 100 mL/h via INTRAVENOUS

## 2015-06-13 MED ORDER — METOPROLOL TARTRATE 12.5 MG HALF TABLET
12.5000 mg | ORAL_TABLET | Freq: Two times a day (BID) | ORAL | Status: DC
  Administered 2015-06-13 – 2015-06-17 (×8): 12.5 mg via ORAL
  Filled 2015-06-13 (×9): qty 1

## 2015-06-13 MED ORDER — HYDROCODONE-ACETAMINOPHEN 5-325 MG PO TABS: 1.0000 | ORAL_TABLET | Freq: Four times a day (QID) | ORAL | Status: DC | PRN

## 2015-06-13 MED ORDER — POLYVINYL ALCOHOL 1.4 % OP SOLN: 1.0000 [drp] | Freq: Every day | OPHTHALMIC | Status: DC | PRN

## 2015-06-13 MED ORDER — CIPROFLOXACIN IN D5W 400 MG/200ML IV SOLN: 400.0000 mg | Freq: Two times a day (BID) | INTRAVENOUS | Status: DC

## 2015-06-13 MED ORDER — MOMETASONE FURO-FORMOTEROL FUM 100-5 MCG/ACT IN AERO
2.0000 | INHALATION_SPRAY | Freq: Two times a day (BID) | RESPIRATORY_TRACT | Status: DC
  Administered 2015-06-13 – 2015-06-17 (×6): 2 via RESPIRATORY_TRACT
  Filled 2015-06-13: qty 8.8

## 2015-06-13 MED ORDER — DEXTROSE 5 % IV SOLN
2.0000 g | INTRAVENOUS | Status: DC
  Administered 2015-06-13 – 2015-06-14 (×2): 2 g via INTRAVENOUS
  Filled 2015-06-13 (×2): qty 2

## 2015-06-13 MED ORDER — GUAIFENESIN ER 600 MG PO TB12: 600.0000 mg | ORAL_TABLET | Freq: Two times a day (BID) | ORAL | Status: DC | PRN

## 2015-06-13 MED ORDER — ACETAMINOPHEN 650 MG RE SUPP: 650.0000 mg | Freq: Four times a day (QID) | RECTAL | Status: DC | PRN

## 2015-06-13 MED ORDER — MORPHINE SULFATE (PF) 4 MG/ML IV SOLN: 4.0000 mg | INTRAVENOUS | Status: DC | PRN

## 2015-06-13 MED ORDER — PROPYLENE GLYCOL 0.6 % OP SOLN: 1.0000 [drp] | OPHTHALMIC | Status: DC | PRN

## 2015-06-13 NOTE — Progress Notes (Signed)
[facility] Surgery Consult Note  Preston Boyd 1941/01/22  [id number].    Requesting MD: Dr. Rockne Menghini Chief Complaint/Reason for Consult: Abdominal pain  HPI:  74 y/o white male with PMH GERD, HTN, CKD, COPD, CAD, BHP, with h/o b/l inguinal hernia repairs presents to [facility] from the outpatient office with complaints of 4-5 days of RUQ/epigastric abdominal pain.  He was directly admitted to the hospitalist service after refusing to come to the ER.  He has a h/o gallbladder attacks over the last 2 years (but never seen a Psychologist, sport and exercise), but this has been the worse.  Friday the pain was very severe, sharp and crampy 10/10.  He developed N/V on Saturday, also c/o dark urine and constipation.  Pain radiates to his back and right shoulder.  Has been tolerating liquids, last BM 2 days ago.  No current nausea.  No precipitating or alleviating factors.  No fevers/chills, no CP/SOB, diarrhea.  He was unable to sleep the night of 06/09/15 secondary to severe abdominal pain, and went to [facility] 06[facility]15.  He then saw Dr. Asa Lente on 06/12/15 who ordered the [place] as below and blood work.  He had a Cath/lexiscan 01/2015 and is now managed medically for CAD.  Non-obstructive CAD s/p heart catheterization 02/15/15 showing 10% proximal LAD lesion, 40% RPDA lesion with normal EF and no regional wall motion abnormalities.  Normally on Aspirin, lopressor, imdur.    His US shows gallbladder wall thickening, cholelithiasis, CBD dilatation, 2 stones near the neck of the gallbladder, and +murphy's sign consistent with cholecystitis.  Bili is 1.8, AST/ALT and Alk phos negative, WBC is 15.4.  ROS: All systems reviewed and otherwise negative except for as above  Family History  Problem Relation Age of Onset  . Colon cancer Mother   . Lung cancer Father   . Alcohol abuse Other   . Arthritis Other   . Diabetes Other   . Hypertension Other   . Heart disease Other   . Heart attack Maternal Uncle   . Stroke  Paternal Grandfather     Past Medical History  Diagnosis Date  . DEGENERATIVE JOINT DISEASE, RIGHT HIP     s/p THR 11/2008  . Osteoarthritis of knee     s/p B TKR 07/2009  . GERD   . Hypertension   . RENAL INSUFFICIENCY   . BENIGN PROSTATIC HYPERTROPHY   . MIGRAINE HEADACHE   . COPD (chronic obstructive pulmonary disease) ([place])   . CKD (chronic kidney disease) stage 3, GFR 30-59 ml/min   . History of cardiovascular stress test 01/2015    Lexiscan Myoview 7/16:  Inferoseptal, inferior and apical ischemia, EF 67%, TID 1.09; intermediate risk  . CAD (coronary artery disease) 01/2015    mild on cath 01/2015, med mgmt rec    Past Surgical History  Procedure Laterality Date  . Tonsillectomy    . Total hip arthroplasty  11/2008    Dr. Berenice Primas  . Replacement total knee bilateral  07/2009    graves  . Cataract extraction, bilateral  10/2012  . Cardiac catheterization N/A 02/15/2015    Procedure: Left Heart Cath and Coronary Angiography;  Surgeon: Troy Sine, MD;  Location: [place] [facility];  Service: Cardiovascular;  Laterality: N/A;    Social History:  reports that he quit smoking about 52 years ago. He has never used smokeless tobacco. He reports that he drinks alcohol. He reports that he does not use illicit drugs.  Allergies:  Allergies  Allergen Reactions  . Other Other (See Comments)    Amoxicillin > body aches all over per patient    Medications Prior to Admission  Medication Sig Dispense Refill  . acetaminophen (TYLENOL) 500 MG tablet Take 500 mg by mouth every 6 (six) hours as needed for moderate pain.     Marland Kitchen aspirin EC 325 MG tablet Take 1 tablet (325 mg total) by mouth daily. 100 tablet 3  . ciprofloxacin (CIPRO) 500 MG tablet Take 1 tablet (500 mg total) by mouth 2 (two) times daily.    . Flaxseed, Linseed, (FLAX SEED OIL PO) Take 15 mLs by mouth daily.    . Fluticasone-Salmeterol (ADVAIR DISKUS) 250-50 MCG/DOSE AEPB Inhale 1 puff into the lungs 2 (two) times daily as  needed (for shortness of breath). 60 each 3  . GuaiFENesin (MUCINEX PO) Take 1 tablet by mouth as needed (CONGESTION).     Marland Kitchen HYDROcodone-acetaminophen (NORCO/VICODIN) 5-325 MG tablet Take 1 tablet by mouth every 6 (six) hours as needed for moderate pain.    Marland Kitchen ibuprofen (ADVIL,MOTRIN) 200 MG tablet Take 200 mg by mouth every 6 (six) hours as needed for moderate pain.     . isosorbide mononitrate (IMDUR) 30 MG 24 hr tablet Take 1 tablet (30 mg total) by mouth daily. 30 tablet 6  . metoprolol tartrate (LOPRESSOR) 25 MG tablet Take 0.5 tablets (12.5 mg total) by mouth 2 (two) times daily. 30 tablet 6  . Olopatadine HCl (PAZEO) 0.7 % SOLN Place 1 drop into both eyes daily as needed (for dry eyes).    . ondansetron (ZOFRAN) 4 MG tablet Take 4 mg by mouth every 8 (eight) hours as needed for nausea or vomiting.    Marland Kitchen Propylene Glycol (SYSTANE BALANCE OP) Place 1 drop into both eyes as needed (for dry eyes).      Blood pressure 144/62, pulse 75, temperature 98.5 F (36.9 C), temperature source Oral, resp. rate 18, height '6\' 2"'  (1.88 m), weight 99.338 kg (219 lb). Physical Exam: General: pleasant, WD/WN white male who is laying in bed in NAD HEENT: head is normocephalic, atraumatic.  Sclera are noninjected, non-icteric.  PERRL.  Ears and nose without any masses or lesions.  Mouth is pink and moist Heart: regular, rate, and rhythm.  No obvious murmurs, gallops, or rubs noted.  Palpable pedal pulses bilaterally Lungs: CTAB, no wheezes, rhonchi, or rales noted.  Respiratory effort nonlabored Abd: soft, obese, mild distension, moderate tenderness in RUQ and epigastrium, +BS, no masses, hernias, or organomegaly, well healed scars in B/L groins (B/L inguinal hernia repairs) MS: all 4 extremities are symmetrical with no cyanosis, clubbing, or edema. Skin: warm and dry with no masses, lesions, or rashes Psych: A&Ox3 with an appropriate affect.   Results for orders placed or performed during the hospital  encounter of 06/13/15 (from the past 48 hour(s))  CBC with Differential/Platelet     Status: Abnormal   Collection Time: 06/13/15 10:20 AM  Result Value Ref Range   WBC 15.4 (H) 4.0 - 10.5 K/uL    Comment: WHITE COUNT CONFIRMED ON SMEAR   RBC 5.23 4.22 - 5.81 MIL/uL   Hemoglobin 16.3 13.0 - 17.0 g/dL   HCT 48.9 39.0 - 52.0 %   MCV 93.5 78.0 - 100.0 fL   MCH 31.2 26.0 - 34.0 pg   MCHC 33.3 30.0 - 36.0 g/dL   RDW 13.4 11.5 - 15.5 %   Platelets PLATELET CLUMPS NOTED ON SMEAR, UNABLE TO ESTIMATE 150 - 400 K/uL  Neutrophils Relative % 79 %   Lymphocytes Relative 11 %   Monocytes Relative 10 %   Eosinophils Relative 0 %   Basophils Relative 0 %   Neutro Abs 12.2 (H) 1.7 - 7.7 K/uL   Lymphs Abs 1.7 0.7 - 4.0 K/uL   Monocytes Absolute 1.5 (H) 0.1 - 1.0 K/uL   Eosinophils Absolute 0.0 0.0 - 0.7 K/uL   Basophils Absolute 0.0 0.0 - 0.1 K/uL   Smear Review PLATELET CLUMPS NOTED ON SMEAR    US Abdomen Complete  06/12/2015  CLINICAL DATA:  Right upper quadrant pain EXAM: ULTRASOUND ABDOMEN COMPLETE COMPARISON:  None. FINDINGS: Gallbladder: There are 2 gallstones towards the neck of the gallbladder. The larger measures 2.3 cm. Gallbladder wall is thickened at 6 mm. Sonographic Murphy sign is positive. Common bile duct: Diameter: 9 mm Liver: Diffusely increased in echogenicity without focal mass. No intrahepatic biliary dilatation. IVC: No abnormality visualized. Pancreas: No pancreatic head mass. The pancreatic duct is upper normal in caliber at 3.7 mm. Spleen: Size and appearance within normal limits. Right Kidney: Length: 11.2 cm. Echogenicity within normal limits. No mass or hydronephrosis visualized. Left Kidney: Length: 11.2 cm. Echogenicity within normal limits. No mass or hydronephrosis visualized. Abdominal aorta: No aneurysm visualized. Other findings: None. IMPRESSION: Cholelithiasis There is wall thickening in a positive sonographic Murphy sign suggesting acute cholecystitis. Correlate  clinically as for the need for nuclear medicine imaging. The common bile duct is dilated. Developing biliary obstruction is not excluded. Correlate clinically as for the need for MRCP or ERCP. Diffuse hepatic steatosis. Electronically Signed   By: Marybelle Killings M.D.   On: 06/12/2015 15:01      Assessment/Plan Acute calculus cholecystitis Elevated bili with CBD dilatation  Leukocytosis Acute on CKD H/o CAD  Plan: 1.  Admitted to hospitalist service, we will follow, GI consult for CBD dilatation and elevated bili 2.  Antibiotics (Rocephin/flagyl - first line) 3.  Okay to have clears, NPO MN 4.  Ambulate and IS 5.  Recheck labs in am - if up may need ERCP vs MRCP, if improved he will likely need OR 6.  Talked with Dr. Rockne Menghini, she doesn't think he needs pre-op clearance given recent [place] and cath done in July 2016 7.  Needs EKG and I ordered CXR for anesthesia   Nat Christen, [facility] Surgery 06/13/2015, 4:28 PM Pager: [phone]

## 2015-06-13 NOTE — Telephone Encounter (Signed)
Spoke with referral. Referral called [facility]. [facility] would schedule an immediate surgery consult, stating is was not an emergency.   PCP and myself contacting on call physician and hospitalist for additional options for pt.

## 2015-06-13 NOTE — Telephone Encounter (Signed)
Labs collected yesterday still not resulted??? US shows acute chole with 2.3cm obst stone Needs gen surg eval today - refer done If gen surg unable to see today, pt needs to go to ER for urgent consult as needs surgery for this problem before he gets any sicker Please let pt know same and directions on next steps depending on this plan Thanks!

## 2015-06-13 NOTE — H&P (Signed)
History and Physical:    Preston Boyd   WUJ:[id number] DOB: 11/06/1940 DOA: 06/13/2015  Referring MD/provider: Dr. Felicity CoyerLeschber PCP: Rene PaciValerie Leschber, MD   Chief Complaint: Abdominal pain  History of Present Illness:   Preston BusmanCharles O Boyd is an 74 y.o. male with a  PMH of non-obstructive CAD s/p heart catheterization 02/15/15 showing 10% proximal LAD lesion, 40% RPDA lesion with normal EF and no regional wall motion abnormalities, HTN, COPD and stage III CKD who [facility] by his PCP for treatment of cholecystitis in the setting of cholelithiasis and a 4 day history of RUQ abdominal pain.  The patient states he developed RUQ abdominal pain, 10/10, sharp/crampy after eating "a handful of pecans" 06/09/15.  Of note, he was worked up for similar symptoms in the 1990s, and was told he had gallstones but never followed up with a Careers advisersurgeon, as recommended.  He was unable to sleep the night of 06/09/15 secondary to severe abdominal pain, and went to [facility] on 06/10/15.  No diagnostic evaluation was done at the time, and he was given prescriptions for Zofran, Norco and Cipro.  He then saw Dr. Felicity CoyerLeschber on 06/12/15, who ordered routine blood work and a RUQ ultrasound.  The RUQ ultrasound showed 2 gallstones towards the neck of the gallbladder, a thickened gallbladder wall and a + Murphy sign.  WBC 15.4.  His pain is currently constant but crampy with pain radiating up to right shoulder and across right flank, but has improved slightly from when it first began.  He has not had anything significant to eat in 4 days, so he is unsure if eating worsens pain.  Pain medication have helped eased the pain off slightly.  ROS:   Review of Systems  Constitutional: Positive for chills and malaise/fatigue. Negative for fever and weight loss.  HENT: Negative.   Respiratory: Positive for shortness of breath. Negative for cough.   Cardiovascular: Negative for chest pain and  palpitations.  Gastrointestinal: Positive for nausea, vomiting and abdominal pain. Negative for diarrhea, blood in stool and melena.  Genitourinary: Positive for dysuria.  Skin: Negative.   Neurological: Positive for weakness.  Endo/Heme/Allergies: Negative.   Psychiatric/Behavioral: Negative.      Past Medical History:   Past Medical History  Diagnosis Date  . DEGENERATIVE JOINT DISEASE, RIGHT HIP     s/p THR 11/2008  . Osteoarthritis of knee     s/p B TKR 07/2009  . GERD   . Hypertension   . RENAL INSUFFICIENCY   . BENIGN PROSTATIC HYPERTROPHY   . MIGRAINE HEADACHE   . COPD (chronic obstructive pulmonary disease) (HCC)   . CKD (chronic kidney disease) stage 3, GFR 30-59 ml/min   . History of cardiovascular stress test 01/2015    Lexiscan Myoview 7/16:  Inferoseptal, inferior and apical ischemia, EF 67%, TID 1.09; intermediate risk  . CAD (coronary artery disease) 01/2015    mild on cath 01/2015, med mgmt rec    Past Surgical History:   Past Surgical History  Procedure Laterality Date  . Tonsillectomy    . Total hip arthroplasty  11/2008    Dr. Luiz BlareGraves  . Replacement total knee bilateral  07/2009    graves  . Cataract extraction, bilateral  10/2012  . Cardiac catheterization N/A 02/15/2015    Procedure: Left Heart Cath and Coronary Angiography;  Surgeon: Lennette Biharihomas A Kelly, MD;  [facility];  Service: Cardiovascular;  Laterality: N/A;  Social History:   Social History   Social History  . Marital Status: Single    Spouse Name: N/A  . Number of Children: 0  . Years of Education: 16   Occupational History  . Retired    Social History Main Topics  . Smoking status: Former Smoker    Quit date: 07/22/1962  . Smokeless tobacco: Never Used  . Alcohol Use: 0.0 oz/week    0 Standard drinks or equivalent per week     Comment: ocassionally  . Drug Use: No  . Sexual Activity: No   Other Topics Concern  . Not on file   Social History Narrative   Single,  lives alone. 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 Colon cancer Mother   . Lung cancer Father   . Alcohol abuse Other   . Arthritis Other   . Diabetes Other   . Hypertension Other   . Heart disease Other   . Heart attack Maternal Uncle   . Stroke Paternal Grandfather     Allergies   Other  Current Medications:   Prior to Admission medications   Medication Sig Start Date End Date Taking? Authorizing Provider  acetaminophen (TYLENOL) 500 MG tablet Take 500 mg by mouth every 6 (six) hours as needed for moderate pain.     Historical Provider, MD  aspirin EC 325 MG tablet Take 1 tablet (325 mg total) by mouth daily. 06/12/15   Newt Lukes, MD  ciprofloxacin (CIPRO) 500 MG tablet Take 1 tablet (500 mg total) by mouth 2 (two) times daily. 06/10/15 06/17/15  Newt Lukes, MD  Flaxseed, Linseed, (FLAX SEED OIL PO) Take 15 mLs by mouth daily.    Historical Provider, MD  Fluticasone-Salmeterol (ADVAIR DISKUS) 250-50 MCG/DOSE AEPB Inhale 1 puff into the lungs 2 (two) times daily as needed (for shortness of breath). 03/07/15   Newt Lukes, MD  GuaiFENesin (MUCINEX PO) Take 1 tablet by mouth as needed (CONGESTION).     Historical Provider, MD  HYDROcodone-acetaminophen (NORCO/VICODIN) 5-325 MG tablet Take 1 tablet by mouth every 6 (six) hours as needed for moderate pain.    Historical Provider, MD  ibuprofen (ADVIL,MOTRIN) 200 MG tablet Take 200 mg by mouth every 6 (six) hours as needed for moderate pain.     Historical Provider, MD  isosorbide mononitrate (IMDUR) 30 MG 24 hr tablet Take 1 tablet (30 mg total) by mouth daily. 06/12/15   Newt Lukes, MD  metoprolol tartrate (LOPRESSOR) 25 MG tablet Take 0.5 tablets (12.5 mg total) by mouth 2 (two) times daily. 06/12/15   Newt Lukes, MD  Olopatadine HCl (PAZEO) 0.7 % SOLN Place 1 drop into both  eyes daily as needed (for dry eyes).    Historical Provider, MD  ondansetron (ZOFRAN) 4 MG tablet Take 4 mg by mouth every 8 (eight) hours as needed for nausea or vomiting.    Historical Provider, MD  Propylene Glycol (SYSTANE BALANCE OP) Place 1 drop into both eyes as needed (for dry eyes).    Historical Provider, MD    Physical Exam:   Filed Vitals:   06/13/15 1558  BP: 144/62  Pulse: 75  Temp: 98.5 F (36.9 C)  TempSrc: Oral  Resp: 18  Height:  (1.88 m)  Weight: 99.338 kg (219 lb)     Physical Exam: Blood pressure 144/62,  pulse 75, temperature 98.5 F (36.9 C), temperature source Oral, resp. rate 18, height  (1.88 m), weight 99.338 kg (219 lb). Gen: No acute distress. Head: Normocephalic, atraumatic. Eyes: PERRL, EOMI, sclerae nonicteric. Mouth: Oropharynx clear. Neck: Supple, no thyromegaly, no lymphadenopathy, no jugular venous distention. Chest: Lungs CTAB. CV: Heart sounds are regular, no M/R/G. Abdomen: Soft, tender RUQ but no guarding or rebound. Extremities: Extremities are without C/E/C. Skin: Warm and dry. Neuro: Alert and oriented times 3; non-focal. Psych: Mood and affect normal.   Data Review:   Basic Metabolic Panel:  Recent Labs Lab 06/12/15 1038  NA 131*  K 4.2  CL 94*  CO2 25  GLUCOSE 88  BUN 43*  CREATININE 2.67*  CALCIUM 9.5   GFR Estimated Creatinine Clearance: 31 mL/min (by C-G formula based on Cr of 2.67). Liver Function Tests:  Recent Labs Lab 06/12/15 1038  AST 20  ALT 44  ALKPHOS 55  BILITOT 1.8*  PROT 7.4  ALBUMIN 3.9   CBC:  Recent Labs Lab 06/13/15 1020  WBC 15.4*  NEUTROABS 12.2*  HGB 16.3  HCT 48.9  MCV 93.5  PLT PLATELET CLUMPS NOTED ON SMEAR, UNABLE TO ESTIMATE   Lipid Profile  Recent Labs  06/12/15 1038  CHOL 150  HDL 57.00  LDLCALC 75  TRIG 91.0  CHOLHDL 3    Radiographic Studies: US Abdomen Complete  06/12/2015  CLINICAL DATA:  Right upper quadrant pain EXAM: ULTRASOUND ABDOMEN  COMPLETE COMPARISON:  None. FINDINGS: Gallbladder: There are 2 gallstones towards the neck of the gallbladder. The larger measures 2.3 cm. Gallbladder wall is thickened at 6 mm. Sonographic Murphy sign is positive. Common bile duct: Diameter: 9 mm Liver: Diffusely increased in echogenicity without focal mass. No intrahepatic biliary dilatation. IVC: No abnormality visualized. Pancreas: No pancreatic head mass. The pancreatic duct is upper normal in caliber at 3.7 mm. Spleen: Size and appearance within normal limits. Right Kidney: Length: 11.2 cm. Echogenicity within normal limits. No mass or hydronephrosis visualized. Left Kidney: Length: 11.2 cm. Echogenicity within normal limits. No mass or hydronephrosis visualized. Abdominal aorta: No aneurysm visualized. Other findings: None. IMPRESSION: Cholelithiasis There is wall thickening in a positive sonographic Murphy sign suggesting acute cholecystitis. Correlate clinically as for the need for nuclear medicine imaging. The common bile duct is dilated. Developing biliary obstruction is not excluded. Correlate clinically as for the need for MRCP or ERCP. Diffuse hepatic steatosis. Electronically Signed   By: Jolaine Click M.D.   On: 06/12/2015 15:01    EKG: Ordered, not yet done.   Assessment/Plan:   Principal Problem:   Cholecystitis with elevated bilirubin - Surgery and GI consulted.  Likely will need MRCP vs. ERCP and a cholecystectomy. - Continue Cipro, add Flagyl. - Start IVF. - Morphine PRN pain, Zofran PRN nausea/vomiting.  Active Problems:   Hypertension / CAD - Continue Metoprolol and Imdur.  Non-obstructive CAD by cath done 02/15/15. - F/U EKG. - Resume aspirin post-operatively.    COPD (chronic obstructive pulmonary disease) (HCC) - Stable.  Continue Advair, Mucinex PRN.    AKI/ Hyponatremia in the setting of CKD (chronic kidney disease) stage 3, GFR 30-59 ml/min - Baseline creatinine is 1.77. - Hyponatremia and creatinine elevation  over baseline values likely is secondary to dehydration/pre-renal causes. - Hydrate and monitor.    DVT prophylaxis - SCDs preoperatively, please start Lovenox post-operatively.  Code Status / Family Communication / Disposition Plan:   Code Status: Full. Family Communication: No family at bedside. Disposition Plan: Home  when stable.  Will likely be here several days, depending on timing of cholecystectomy and stability after surgery.  Attestation regarding necessity of inpatient status:   The appropriate admission status for this patient is INPATIENT. Inpatient status is judged to be reasonable and necessary in order to provide the required intensity of service to ensure the patient's safety. The patient's presenting symptoms, physical exam findings, and initial radiographic and laboratory data in the context of their chronic comorbidities is felt to place them at high risk for further clinical deterioration. Furthermore, it is not anticipated that the patient will be medically stable for discharge from the hospital within 2 midnights of admission. The following factors support the admission status of inpatient.   -The patient's presenting symptoms include RUQ abdominal pain and biliary colic. - The worrisome physical exam findings include Tenderness RUQ, +Murphy sign. - The initial radiographic and laboratory data are worrisome because of Gallbladder wall thickening, gallstones. - The chronic co-morbidities include CAD, COPD, HTN. - Patient requires inpatient status due to high intensity of service, high risk for further deterioration and high frequency of surveillance required. - I certify that at the point of admission it is my clinical judgment that the patient will require inpatient hospital care spanning beyond 2 midnights from the point of admission.   Time spent: 1 hour.  Jerome Otter [facility] Pager [phone] Cell: [phone]   If 7PM-7AM, please contact  night-coverage [email or web address] Password [facility] 06/13/2015, 4:27 PM

## 2015-06-13 NOTE — Addendum Note (Signed)
Addended by: Radford PaxAIRRIKIER DAVIDSON, Helaina Stefano M on: 06/13/2015 10:22 AM   Modules accepted: Orders, SmartSet

## 2015-06-13 NOTE — Telephone Encounter (Signed)
Checked pt chart and [facility]   Closing this note.

## 2015-06-13 NOTE — Consult Note (Signed)
Referring Provider: Dr. Darnelle Catalan Primary Care Physician:  Rene Paci, MD Primary Gastroenterologist:  Gentry Fitz  Reason for Consultation:  Cholecystitis, elevated LFTs  HPI: Preston Boyd is a 74 y.o. male with acute onset of RUQ pain this past Friday after eating pecans and the pain was sharp 10/10 like "I was stabbed with a knife." Pain persisted that entire night and reports lasted until Sunday. +N with dry heaves without vomiting. Denies F/C/Diarrhea. Denies melena, hematochezia. Reports having a milder episode of RUQ pain about a month ago. U/S c/w acute cholecystitis and biliary dilation noted without a CBD stone seen. TB 1.8, nl AST/ALT/ALP. WBC 15.4. No lipase done. Moves bowels daily to every other day. Occasional alcohol. Denies history of ulcers. Denies NSAIDs. Has never had a colonoscopy or EGD. Reports mother died of colon cancer at age 74.    Past Medical History  Diagnosis Date  . DEGENERATIVE JOINT DISEASE, RIGHT HIP     s/p THR 11/2008  . Osteoarthritis of knee     s/p B TKR 07/2009  . GERD   . Hypertension   . RENAL INSUFFICIENCY   . BENIGN PROSTATIC HYPERTROPHY   . MIGRAINE HEADACHE   . COPD (chronic obstructive pulmonary disease) (HCC)   . CKD (chronic kidney disease) stage 3, GFR 30-59 ml/min   . History of cardiovascular stress test 01/2015    Lexiscan Myoview 7/16:  Inferoseptal, inferior and apical ischemia, EF 67%, TID 1.09; intermediate risk  . CAD (coronary artery disease) 01/2015    mild on cath 01/2015, med mgmt rec    Past Surgical History  Procedure Laterality Date  . Tonsillectomy    . Total hip arthroplasty  11/2008    Dr. Luiz Blare  . Replacement total knee bilateral  07/2009    graves  . Cataract extraction, bilateral  10/2012  . Cardiac catheterization N/A 02/15/2015    Procedure: Left Heart Cath and Coronary Angiography;  Surgeon: Lennette Bihari, MD;  Location: [facility] INVASIVE CV LAB;  Service: Cardiovascular;  Laterality: N/A;  . Inguinal hernia  repair Bilateral 1960's    Prior to Admission medications   Medication Sig Start Date End Date Taking? Authorizing Provider  acetaminophen (TYLENOL) 500 MG tablet Take 500 mg by mouth every 6 (six) hours as needed for moderate pain.    Yes Historical Provider, MD  aspirin EC 325 MG tablet Take 1 tablet (325 mg total) by mouth daily. 06/12/15  Yes Newt Lukes, MD  ciprofloxacin (CIPRO) 500 MG tablet Take 1 tablet (500 mg total) by mouth 2 (two) times daily. 06/10/15 06/17/15 Yes Newt Lukes, MD  Flaxseed, Linseed, (FLAX SEED OIL PO) Take 15 mLs by mouth daily.   Yes Historical Provider, MD  Fluticasone-Salmeterol (ADVAIR DISKUS) 250-50 MCG/DOSE AEPB Inhale 1 puff into the lungs 2 (two) times daily as needed (for shortness of breath). 03/07/15  Yes Newt Lukes, MD  GuaiFENesin (MUCINEX PO) Take 1 tablet by mouth as needed (CONGESTION).    Yes Historical Provider, MD  HYDROcodone-acetaminophen (NORCO/VICODIN) 5-325 MG tablet Take 1 tablet by mouth every 6 (six) hours as needed for moderate pain.   Yes Historical Provider, MD  ibuprofen (ADVIL,MOTRIN) 200 MG tablet Take 200 mg by mouth every 6 (six) hours as needed for moderate pain.    Yes Historical Provider, MD  isosorbide mononitrate (IMDUR) 30 MG 24 hr tablet Take 1 tablet (30 mg total) by mouth daily. 06/12/15  Yes Newt Lukes, MD  metoprolol tartrate (LOPRESSOR) 25 MG  tablet Take 0.5 tablets (12.5 mg total) by mouth 2 (two) times daily. 06/12/15  Yes Newt LukesValerie A Leschber, MD  Olopatadine HCl (PAZEO) 0.7 % SOLN Place 1 drop into both eyes daily as needed (for dry eyes).   Yes Historical Provider, MD  ondansetron (ZOFRAN) 4 MG tablet Take 4 mg by mouth every 8 (eight) hours as needed for nausea or vomiting.   Yes Historical Provider, MD  Propylene Glycol (SYSTANE BALANCE OP) Place 1 drop into both eyes as needed (for dry eyes).   Yes Historical Provider, MD    Scheduled Meds: . cefTRIAXone (ROCEPHIN)  IV  2 g  Intravenous Q24H  . isosorbide mononitrate  30 mg Oral Daily  . metoprolol tartrate  12.5 mg Oral BID  . metroNIDAZOLE  500 mg Oral 3 times per day  . mometasone-formoterol  2 puff Inhalation BID   Continuous Infusions: . sodium chloride     PRN Meds:.acetaminophen **OR** acetaminophen, guaiFENesin, HYDROcodone-acetaminophen, morphine injection, ondansetron **OR** ondansetron (ZOFRAN) IV, polyvinyl alcohol  Allergies as of 06/13/2015 - Review Complete 06/13/2015  Allergen Reaction Noted  . Other Other (See Comments) 02/08/2015    Family History  Problem Relation Age of Onset  . Colon cancer Mother   . Lung cancer Father   . Alcohol abuse Other   . Arthritis Other   . Diabetes Other   . Hypertension Other   . Heart disease Other   . Heart attack Maternal Uncle   . Stroke Paternal Grandfather     Social History   Social History  . Marital Status: Single    Spouse Name: N/A  . Number of Children: 0  . Years of Education: 16   Occupational History  . Retired    Social History Main Topics  . Smoking status: Former Smoker    Quit date: 07/22/1962  . Smokeless tobacco: Never Used  . Alcohol Use: 0.0 oz/week    0 Standard drinks or equivalent per week     Comment: ocassionally  . Drug Use: No  . Sexual Activity: No   Other Topics Concern  . Not on file   Social History Narrative   Single, lives alone. Retired from 3rd [employer]. Also retired from [employer] and [employer]    Review of Systems: All negative except as stated above in HPI.  Physical Exam: Vital signs: Filed Vitals:   06/13/15 1558  BP: 144/62  Pulse: 75  Temp: 98.5 F (36.9 C)  Resp: 18   Last BM Date: 06/11/15 General:   Alert,  Well-developed, well-nourished, pleasant and cooperative in NAD, elderly Head: atraumatic Eyes: anicteric sclera ENT: oropharynx clear Neck: supple, nontender Lungs:  Clear throughout to auscultation.   No wheezes, crackles, or rhonchi. No acute  distress. Heart:  Regular rate and rhythm; no murmurs, clicks, rubs,  or gallops. Abdomen: RUQ tenderness with guarding, soft, nondistended, +BS  Rectal:  Deferred Ext: no edema  GI:  Lab Results:  Recent Labs  06/13/15 1020  WBC 15.4*  HGB 16.3  HCT 48.9  PLT PLATELET CLUMPS NOTED ON SMEAR, UNABLE TO ESTIMATE   BMET  Recent Labs  06/12/15 1038  NA 131*  K 4.2  CL 94*  CO2 25  GLUCOSE 88  BUN 43*  CREATININE 2.67*  CALCIUM 9.5   LFT  Recent Labs  06/12/15 1038  PROT 7.4  ALBUMIN 3.9  AST 20  ALT 44  ALKPHOS 55  BILITOT 1.8*  BILIDIR 0.5*   PT/INR No results  for input(s): LABPROT, INR in the last 72 hours.   Studies/Results: US Abdomen Complete  06/12/2015  CLINICAL DATA:  Right upper quadrant pain EXAM: ULTRASOUND ABDOMEN COMPLETE COMPARISON:  None. FINDINGS: Gallbladder: There are 2 gallstones towards the neck of the gallbladder. The larger measures 2.3 cm. Gallbladder wall is thickened at 6 mm. Sonographic Murphy sign is positive. Common bile duct: Diameter: 9 mm Liver: Diffusely increased in echogenicity without focal mass. No intrahepatic biliary dilatation. IVC: No abnormality visualized. Pancreas: No pancreatic head mass. The pancreatic duct is upper normal in caliber at 3.7 mm. Spleen: Size and appearance within normal limits. Right Kidney: Length: 11.2 cm. Echogenicity within normal limits. No mass or hydronephrosis visualized. Left Kidney: Length: 11.2 cm. Echogenicity within normal limits. No mass or hydronephrosis visualized. Abdominal aorta: No aneurysm visualized. Other findings: None. IMPRESSION: Cholelithiasis There is wall thickening in a positive sonographic Murphy sign suggesting acute cholecystitis. Correlate clinically as for the need for nuclear medicine imaging. The common bile duct is dilated. Developing biliary obstruction is not excluded. Correlate clinically as for the need for MRCP or ERCP. Diffuse hepatic steatosis. Electronically Signed    By: Jolaine Click M.D.   On: 06/12/2015 15:01    Impression/Plan: 74 yo with acute cholecystitis and slightly elevated bilirubin of 1.8 with normal transaminases and ALP. CBD 9mm. Lipase ordered. I do not think he has a common bile duct stone present and I think he needs a lap chole with an intraoperative cholangiogram. If a stone is present on IOC then can do ERCP postop. Has metal joints in knees and hip so cannot have an MRCP but at this time I do not think additional imaging is needed of his pancreas or biliary tree prior to IOC in surgery unless lipase is elevated. D/W Dr. Darnelle Catalan. Please call us back if IOC is positive.    LOS: 0 days   Anjoli Diemer C.  06/13/2015, 5:09 PM  Pager [phone]  If no answer or after 5 PM call [phone]

## 2015-06-13 NOTE — Telephone Encounter (Signed)
I have spoken with gen surg Marlyne BeardsJennings - recommended med admission for mgmt of comorbid CKD and COPD, but agrees with admission for chole now given sx and US results Spoke with pt - does not want to go to ED for cost, but agrees to admission if can be arranged direct Call [facility] [place], concerns with direct admission given pt not seen in office today Call [facility]anama [facility] and floor will call Dr. Darnelle Catalanama when arrives

## 2015-06-13 NOTE — Telephone Encounter (Signed)
Contacted the Bed Control - admitting [phone]). Spoke to [place]. Joni Reiningicole was able to place the order for reg med surg bed with dx acute cholecystitis and CKD. Added Dr. Darnelle Catalanama and admitting physician. They will call Dr. Darnelle Catalanama and pt directly when bed is ready.   [facility]. Confirmed with tech that they could see the order and release then result. Tech stated that everything looked to be fine and will call if there are any issues with the results being placed into Epic.

## 2015-06-13 NOTE — Telephone Encounter (Signed)
Pt contaced and asked if he had been contacted abou the bed. Pt stated that he had not heard. Called bed control and spoke to [place]. Joni Reiningicole informed me that beds are ready but need to be cleaned first and that there is a stat order for them to be cleaned. Called pt back and informed of the same. Sent hippa compliant note to PCP and an Iodine message to Dr. Darnelle Catalanama stating the same.

## 2015-06-14 ENCOUNTER — Inpatient Hospital Stay (HOSPITAL_COMMUNITY): Payer: Medicare HMO | Admitting: Anesthesiology

## 2015-06-14 ENCOUNTER — Encounter (HOSPITAL_COMMUNITY)
Admission: AD | Disposition: A | Discharge status: Home / Self Care | Payer: Self-pay | Source: Ambulatory Visit | Attending: Family Medicine

## 2015-06-14 ENCOUNTER — Inpatient Hospital Stay (HOSPITAL_COMMUNITY): Payer: Medicare HMO

## 2015-06-14 DIAGNOSIS — K819 Cholecystitis, unspecified: Secondary | ICD-10-CM

## 2015-06-14 HISTORY — PX: CHOLECYSTECTOMY: SHX55

## 2015-06-14 LAB — COMPREHENSIVE METABOLIC PANEL
ALK PHOS: 64 U/L (ref 38–126)
ALT: 39 U/L (ref 17–63)
AST: 31 U/L (ref 15–41)
Albumin: 3.2 g/dL — ABNORMAL LOW (ref 3.5–5.0)
Anion gap: 9 (ref 5–15)
BUN: 54 mg/dL — AB (ref 6–20)
CALCIUM: 8.6 mg/dL — AB (ref 8.9–10.3)
CHLORIDE: 103 mmol/L (ref 101–111)
CO2: 25 mmol/L (ref 22–32)
CREATININE: 2.71 mg/dL — AB (ref 0.61–1.24)
GFR calc Af Amer: 25 mL/min — ABNORMAL LOW (ref >60)
GFR calc non Af Amer: 22 mL/min — ABNORMAL LOW (ref >60)
GLUCOSE: 97 mg/dL (ref 65–99)
Potassium: 4.2 mmol/L (ref 3.5–5.1)
SODIUM: 137 mmol/L (ref 135–145)
Total Bilirubin: 1 mg/dL (ref 0.3–1.2)
Total Protein: 6.3 g/dL — ABNORMAL LOW (ref 6.5–8.1)

## 2015-06-14 LAB — CBC
HCT: 40.7 % (ref 39.0–52.0)
Hemoglobin: 13.4 g/dL (ref 13.0–17.0)
MCH: 30.5 pg (ref 26.0–34.0)
MCHC: 32.9 g/dL (ref 30.0–36.0)
MCV: 92.7 fL (ref 78.0–100.0)
PLATELETS: 211 10*3/uL (ref 150–400)
RBC: 4.39 MIL/uL (ref 4.22–5.81)
RDW: 13.3 % (ref 11.5–15.5)
WBC: 8.2 10*3/uL (ref 4.0–10.5)

## 2015-06-14 LAB — PROTIME-INR
INR: 1.24 (ref 0.00–1.49)
Prothrombin Time: 15.7 seconds — ABNORMAL HIGH (ref 11.6–15.2)

## 2015-06-14 LAB — LIPASE, BLOOD: Lipase: 54 U/L — ABNORMAL HIGH (ref 11–51)

## 2015-06-14 LAB — SURGICAL PCR SCREEN
MRSA, PCR: NEGATIVE
Staphylococcus aureus: NEGATIVE

## 2015-06-14 SURGERY — LAPAROSCOPIC CHOLECYSTECTOMY WITH INTRAOPERATIVE CHOLANGIOGRAM
Anesthesia: General | Site: Abdomen

## 2015-06-14 MED ORDER — HYDROCODONE-ACETAMINOPHEN 5-325 MG PO TABS: 1.0000 | ORAL_TABLET | ORAL | Status: DC | PRN

## 2015-06-14 MED ORDER — LACTATED RINGERS IV SOLN
INTRAVENOUS | Status: DC | PRN
  Administered 2015-06-14: 16:00:00 via INTRAVENOUS

## 2015-06-14 MED ORDER — LIP MEDEX EX OINT
1.0000 "application " | TOPICAL_OINTMENT | Freq: Two times a day (BID) | CUTANEOUS | Status: DC
  Administered 2015-06-14 – 2015-06-17 (×6): 1 via TOPICAL
  Filled 2015-06-14 (×2): qty 7

## 2015-06-14 MED ORDER — GLYCOPYRROLATE 0.2 MG/ML IJ SOLN
INTRAMUSCULAR | Status: AC
  Filled 2015-06-14: qty 3

## 2015-06-14 MED ORDER — ONDANSETRON HCL 4 MG/2ML IJ SOLN
INTRAMUSCULAR | Status: AC
  Filled 2015-06-14: qty 2

## 2015-06-14 MED ORDER — PROPOFOL 10 MG/ML IV BOLUS
INTRAVENOUS | Status: DC | PRN
  Administered 2015-06-14: 150 mg via INTRAVENOUS

## 2015-06-14 MED ORDER — ZOLPIDEM TARTRATE 5 MG PO TABS: 5.0000 mg | ORAL_TABLET | Freq: Every evening | ORAL | Status: DC | PRN

## 2015-06-14 MED ORDER — BUPIVACAINE-EPINEPHRINE 0.25% -1:200000 IJ SOLN
INTRAMUSCULAR | Status: AC
  Filled 2015-06-14: qty 2

## 2015-06-14 MED ORDER — SODIUM CHLORIDE 0.9 % IV SOLN: 250.0000 mL | INTRAVENOUS | Status: DC | PRN

## 2015-06-14 MED ORDER — MENTHOL 3 MG MT LOZG
1.0000 | LOZENGE | OROMUCOSAL | Status: DC | PRN
  Filled 2015-06-14: qty 9

## 2015-06-14 MED ORDER — LACTATED RINGERS IV SOLN
INTRAVENOUS | Status: DC
  Administered 2015-06-14: 18:00:00 via INTRAVENOUS

## 2015-06-14 MED ORDER — METOPROLOL TARTRATE 1 MG/ML IV SOLN
5.0000 mg | Freq: Four times a day (QID) | INTRAVENOUS | Status: DC | PRN
  Filled 2015-06-14: qty 5

## 2015-06-14 MED ORDER — SODIUM CHLORIDE 0.9 % IJ SOLN: 3.0000 mL | INTRAMUSCULAR | Status: DC | PRN

## 2015-06-14 MED ORDER — SODIUM CHLORIDE 0.9 % IJ SOLN
3.0000 mL | Freq: Two times a day (BID) | INTRAMUSCULAR | Status: DC
  Administered 2015-06-14 – 2015-06-17 (×4): 3 mL via INTRAVENOUS

## 2015-06-14 MED ORDER — ACETAMINOPHEN 10 MG/ML IV SOLN: 1000.0000 mg | Freq: Once | INTRAVENOUS | Status: DC

## 2015-06-14 MED ORDER — ALUM & MAG HYDROXIDE-SIMETH 200-200-20 MG/5ML PO SUSP: 30.0000 mL | Freq: Four times a day (QID) | ORAL | Status: DC | PRN

## 2015-06-14 MED ORDER — GLYCOPYRROLATE 0.2 MG/ML IJ SOLN
INTRAMUSCULAR | Status: DC | PRN
  Administered 2015-06-14: 0.6 mg via INTRAVENOUS

## 2015-06-14 MED ORDER — NEOSTIGMINE METHYLSULFATE 10 MG/10ML IV SOLN
INTRAVENOUS | Status: DC | PRN
Start: 2015-06-14 — End: 2015-06-14
  Administered 2015-06-14: 4 mg via INTRAVENOUS

## 2015-06-14 MED ORDER — IOHEXOL 300 MG/ML  SOLN
INTRAMUSCULAR | Status: DC | PRN
Start: 2015-06-14 — End: 2015-06-14
  Administered 2015-06-14: 10 mL

## 2015-06-14 MED ORDER — BISMUTH SUBSALICYLATE 262 MG/15ML PO SUSP
30.0000 mL | Freq: Three times a day (TID) | ORAL | Status: DC | PRN
  Filled 2015-06-14: qty 118

## 2015-06-14 MED ORDER — PROMETHAZINE HCL 25 MG/ML IJ SOLN: 6.2500 mg | INTRAMUSCULAR | Status: DC | PRN

## 2015-06-14 MED ORDER — PROMETHAZINE HCL 25 MG/ML IJ SOLN
6.2500 mg | INTRAMUSCULAR | Status: AC | PRN
  Administered 2015-06-14 (×2): 12.5 mg via INTRAVENOUS

## 2015-06-14 MED ORDER — HYDROMORPHONE HCL 1 MG/ML IJ SOLN
INTRAMUSCULAR | Status: AC
  Filled 2015-06-14: qty 2

## 2015-06-14 MED ORDER — PROMETHAZINE HCL 25 MG/ML IJ SOLN
INTRAMUSCULAR | Status: AC
  Filled 2015-06-14: qty 1

## 2015-06-14 MED ORDER — ACETAMINOPHEN 10 MG/ML IV SOLN
INTRAVENOUS | Status: AC
  Filled 2015-06-14: qty 100

## 2015-06-14 MED ORDER — ROCURONIUM BROMIDE 100 MG/10ML IV SOLN
INTRAVENOUS | Status: DC | PRN
  Administered 2015-06-14: 20 mg via INTRAVENOUS
  Administered 2015-06-14: 40 mg via INTRAVENOUS

## 2015-06-14 MED ORDER — HYDROMORPHONE HCL 1 MG/ML IJ SOLN
0.2500 mg | INTRAMUSCULAR | Status: DC | PRN
  Administered 2015-06-14 (×4): 0.5 mg via INTRAVENOUS

## 2015-06-14 MED ORDER — SACCHAROMYCES BOULARDII 250 MG PO CAPS
250.0000 mg | ORAL_CAPSULE | Freq: Two times a day (BID) | ORAL | Status: DC
  Administered 2015-06-14 – 2015-06-17 (×6): 250 mg via ORAL
  Filled 2015-06-14 (×7): qty 1

## 2015-06-14 MED ORDER — LACTATED RINGERS IR SOLN
Status: DC | PRN
  Administered 2015-06-14: 5000 mL

## 2015-06-14 MED ORDER — FENTANYL CITRATE (PF) 100 MCG/2ML IJ SOLN
INTRAMUSCULAR | Status: AC
  Filled 2015-06-14: qty 2

## 2015-06-14 MED ORDER — POLYETHYLENE GLYCOL 3350 17 G PO PACK: 17.0000 g | PACK | Freq: Two times a day (BID) | ORAL | Status: DC | PRN

## 2015-06-14 MED ORDER — LIDOCAINE HCL (PF) 2 % IJ SOLN
INTRAMUSCULAR | Status: DC | PRN
  Administered 2015-06-14: 75 mg via INTRADERMAL

## 2015-06-14 MED ORDER — CEFTRIAXONE SODIUM 2 G IJ SOLR
INTRAMUSCULAR | Status: AC
  Filled 2015-06-14: qty 2

## 2015-06-14 MED ORDER — BUPIVACAINE-EPINEPHRINE 0.25% -1:200000 IJ SOLN
INTRAMUSCULAR | Status: DC | PRN
  Administered 2015-06-14: 90 mL

## 2015-06-14 MED ORDER — FENTANYL CITRATE (PF) 100 MCG/2ML IJ SOLN
INTRAMUSCULAR | Status: DC | PRN
  Administered 2015-06-14: 100 ug via INTRAVENOUS
  Administered 2015-06-14 (×3): 50 ug via INTRAVENOUS
  Administered 2015-06-14 (×2): 100 ug via INTRAVENOUS

## 2015-06-14 MED ORDER — BISACODYL 10 MG RE SUPP: 10.0000 mg | Freq: Two times a day (BID) | RECTAL | Status: DC | PRN

## 2015-06-14 MED ORDER — PROPOFOL 10 MG/ML IV BOLUS
INTRAVENOUS | Status: AC
  Filled 2015-06-14: qty 20

## 2015-06-14 MED ORDER — ONDANSETRON HCL 4 MG/2ML IJ SOLN
INTRAMUSCULAR | Status: DC | PRN
  Administered 2015-06-14: 4 mg via INTRAVENOUS

## 2015-06-14 MED ORDER — METHOCARBAMOL 1000 MG/10ML IJ SOLN
1000.0000 mg | Freq: Four times a day (QID) | INTRAVENOUS | Status: DC | PRN
  Filled 2015-06-14: qty 10

## 2015-06-14 MED ORDER — ARTIFICIAL TEARS OP OINT
TOPICAL_OINTMENT | OPHTHALMIC | Status: AC
  Filled 2015-06-14: qty 3.5

## 2015-06-14 MED ORDER — ASPIRIN EC 325 MG PO TBEC
325.0000 mg | DELAYED_RELEASE_TABLET | Freq: Every day | ORAL | Status: DC
  Administered 2015-06-15 – 2015-06-17 (×3): 325 mg via ORAL
  Filled 2015-06-14 (×3): qty 1

## 2015-06-14 MED ORDER — SODIUM CHLORIDE 0.9 % IJ SOLN
INTRAMUSCULAR | Status: AC
  Filled 2015-06-14: qty 10

## 2015-06-14 MED ORDER — DIPHENHYDRAMINE HCL 50 MG/ML IJ SOLN: 12.5000 mg | Freq: Four times a day (QID) | INTRAMUSCULAR | Status: DC | PRN

## 2015-06-14 MED ORDER — EPHEDRINE SULFATE 50 MG/ML IJ SOLN
INTRAMUSCULAR | Status: AC
  Filled 2015-06-14: qty 1

## 2015-06-14 MED ORDER — ROCURONIUM BROMIDE 100 MG/10ML IV SOLN
INTRAVENOUS | Status: AC
  Filled 2015-06-14: qty 1

## 2015-06-14 MED ORDER — MAGIC MOUTHWASH
15.0000 mL | Freq: Four times a day (QID) | ORAL | Status: DC | PRN
  Filled 2015-06-14: qty 15

## 2015-06-14 MED ORDER — SUCCINYLCHOLINE CHLORIDE 20 MG/ML IJ SOLN
INTRAMUSCULAR | Status: DC | PRN
  Administered 2015-06-14: 100 mg via INTRAVENOUS

## 2015-06-14 MED ORDER — DEXTROSE 5 % IV SOLN
2.0000 g | INTRAVENOUS | Status: AC
  Administered 2015-06-15 – 2015-06-16 (×2): 2 g via INTRAVENOUS
  Filled 2015-06-14 (×2): qty 2

## 2015-06-14 MED ORDER — LACTATED RINGERS IV BOLUS (SEPSIS): 1000.0000 mL | Freq: Three times a day (TID) | INTRAVENOUS | Status: AC | PRN

## 2015-06-14 MED ORDER — FENTANYL CITRATE (PF) 250 MCG/5ML IJ SOLN
INTRAMUSCULAR | Status: AC
  Filled 2015-06-14: qty 5

## 2015-06-14 MED ORDER — EPHEDRINE SULFATE 50 MG/ML IJ SOLN
INTRAMUSCULAR | Status: DC | PRN
  Administered 2015-06-14: 10 mg via INTRAVENOUS

## 2015-06-14 MED ORDER — NEOSTIGMINE METHYLSULFATE 10 MG/10ML IV SOLN
INTRAVENOUS | Status: AC
  Filled 2015-06-14: qty 1

## 2015-06-14 MED ORDER — HYDROMORPHONE HCL 1 MG/ML IJ SOLN
0.5000 mg | INTRAMUSCULAR | Status: DC | PRN
  Administered 2015-06-14: 2 mg via INTRAVENOUS
  Administered 2015-06-15 (×2): 1 mg via INTRAVENOUS
  Administered 2015-06-15: 2 mg via INTRAVENOUS
  Administered 2015-06-15: 1 mg via INTRAVENOUS
  Filled 2015-06-14: qty 2
  Filled 2015-06-14 (×3): qty 1
  Filled 2015-06-14: qty 2
  Filled 2015-06-14: qty 1

## 2015-06-14 MED ORDER — PHENOL 1.4 % MT LIQD
2.0000 | OROMUCOSAL | Status: DC | PRN
  Filled 2015-06-14: qty 177

## 2015-06-14 SURGICAL SUPPLY — 39 items
APPLICATOR ARISTA FLEXITIP XL (MISCELLANEOUS) ×2 IMPLANT
APPLIER CLIP 5 13 M/L LIGAMAX5 (MISCELLANEOUS) ×2
APPLIER CLIP ROT 10 11.4 M/L (STAPLE)
CABLE HIGH FREQUENCY MONO STRZ (ELECTRODE) IMPLANT
CLIP APPLIE 5 13 M/L LIGAMAX5 (MISCELLANEOUS) ×1 IMPLANT
CLIP APPLIE ROT 10 11.4 M/L (STAPLE) IMPLANT
COVER MAYO STAND STRL (DRAPES) ×2 IMPLANT
COVER SURGICAL LIGHT HANDLE (MISCELLANEOUS) IMPLANT
DECANTER SPIKE VIAL GLASS SM (MISCELLANEOUS) ×2 IMPLANT
DRAPE C-ARM 42X120 X-RAY (DRAPES) ×2 IMPLANT
DRAPE LAPAROSCOPIC ABDOMINAL (DRAPES) ×2 IMPLANT
DRAPE UTILITY XL STRL (DRAPES) ×2 IMPLANT
DRAPE WARM FLUID 44X44 (DRAPE) ×2 IMPLANT
DRSG TEGADERM 2-3/8X2-3/4 SM (GAUZE/BANDAGES/DRESSINGS) ×4 IMPLANT
DRSG TEGADERM 4X4.75 (GAUZE/BANDAGES/DRESSINGS) ×4 IMPLANT
ELECT REM PT RETURN 9FT ADLT (ELECTROSURGICAL) ×2
ELECTRODE REM PT RTRN 9FT ADLT (ELECTROSURGICAL) ×1 IMPLANT
ENDOLOOP SUT PDS II  0 18 (SUTURE) ×1
ENDOLOOP SUT PDS II 0 18 (SUTURE) ×1 IMPLANT
GAUZE SPONGE 2X2 8PLY STRL LF (GAUZE/BANDAGES/DRESSINGS) ×1 IMPLANT
GLOVE ECLIPSE 8.0 STRL XLNG CF (GLOVE) ×2 IMPLANT
GLOVE INDICATOR 8.0 STRL GRN (GLOVE) ×2 IMPLANT
GOWN STRL REUS W/TWL XL LVL3 (GOWN DISPOSABLE) ×4 IMPLANT
HEMOSTAT ARISTA ABSORB 3G PWDR (MISCELLANEOUS) ×2 IMPLANT
KIT BASIN OR (CUSTOM PROCEDURE TRAY) ×2 IMPLANT
POUCH SPECIMEN RETRIEVAL 10MM (ENDOMECHANICALS) ×2 IMPLANT
SCISSORS LAP 5X35 DISP (ENDOMECHANICALS) ×2 IMPLANT
SET CHOLANGIOGRAPH MIX (MISCELLANEOUS) ×2 IMPLANT
SET IRRIG TUBING LAPAROSCOPIC (IRRIGATION / IRRIGATOR) ×2 IMPLANT
SLEEVE XCEL OPT CAN 5 100 (ENDOMECHANICALS) ×2 IMPLANT
SPONGE GAUZE 2X2 STER 10/PKG (GAUZE/BANDAGES/DRESSINGS) ×1
SUT MNCRL AB 4-0 PS2 18 (SUTURE) ×2 IMPLANT
SUT PDS AB 1 CT1 27 (SUTURE) ×4 IMPLANT
SYR 20CC LL (SYRINGE) ×2 IMPLANT
TOWEL OR 17X26 10 PK STRL BLUE (TOWEL DISPOSABLE) ×2 IMPLANT
TOWEL OR NON WOVEN STRL DISP B (DISPOSABLE) ×2 IMPLANT
TRAY LAPAROSCOPIC (CUSTOM PROCEDURE TRAY) ×2 IMPLANT
TROCAR BLADELESS OPT 5 100 (ENDOMECHANICALS) ×2 IMPLANT
TROCAR XCEL NON-BLD 11X100MML (ENDOMECHANICALS) ×2 IMPLANT

## 2015-06-14 NOTE — Discharge Instructions (Signed)
LAPAROSCOPIC SURGERY: POST OP INSTRUCTIONS ° °1. DIET: Follow a light bland diet the first 24 hours after arrival home, such as soup, liquids, crackers, etc.  Be sure to include lots of fluids daily.  Avoid fast food or heavy meals as your are more likely to get nauseated.  Eat a low fat the next few days after surgery.   °2. Take your usually prescribed home medications unless otherwise directed. °3. PAIN CONTROL: °a. Pain is best controlled by a usual combination of three different methods TOGETHER: °i. Ice/Heat °ii. Over the counter pain medication °iii. Prescription pain medication °b. Most patients will experience some swelling and bruising around the incisions.  Ice packs or heating pads (30-60 minutes up to 6 times a day) will help. Use ice for the first few days to help decrease swelling and bruising, then switch to heat to help relax tight/sore spots and speed recovery.  Some people prefer to use ice alone, heat alone, alternating between ice & heat.  Experiment to what works for you.  Swelling and bruising can take several weeks to resolve.   °c. It is helpful to take an over-the-counter pain medication regularly for the first few weeks.  Choose one of the following that works best for you: °i. Naproxen (Aleve, etc)  Two 220mg tabs twice a day °ii. Ibuprofen (Advil, etc) Three 200mg tabs four times a day (every meal & bedtime) °iii. Acetaminophen (Tylenol, etc) 500-650mg four times a day (every meal & bedtime) °d. A  prescription for pain medication (such as oxycodone, hydrocodone, etc) should be given to you upon discharge.  Take your pain medication as prescribed.  °i. If you are having problems/concerns with the prescription medicine (does not control pain, nausea, vomiting, rash, itching, etc), please call us [phone] to see if we need to switch you to a different pain medicine that will work better for you and/or control your side effect better. °ii. If you need a refill on your pain medication,  please contact your pharmacy.  They will contact our office to request authorization. Prescriptions will not be filled after 5 pm or on week-ends. °4. Avoid getting constipated.  Between the surgery and the pain medications, it is common to experience some constipation.  Increasing fluid intake and taking a fiber supplement (such as Metamucil, Citrucel, FiberCon, MiraLax, etc) 1-2 times a day regularly will usually help prevent this problem from occurring.  A mild laxative (prune juice, Milk of Magnesia, MiraLax, etc) should be taken according to package directions if there are no bowel movements after 48 hours.   °5. Watch out for diarrhea.  If you have many loose bowel movements, simplify your diet to bland foods & liquids for a few days.  Stop any stool softeners and decrease your fiber supplement.  Switching to mild anti-diarrheal medications (Kayopectate, Pepto Bismol) can help.  If this worsens or does not improve, please call us. °6. Wash / shower every day.  You may shower over the dressings as they are waterproof.  Continue to shower over incision(s) after the dressing is off. °7. Remove your waterproof bandages 5 days after surgery.  You may leave the incision open to air.  You may replace a dressing/Band-Aid to cover the incision for comfort if you wish.  °8. ACTIVITIES as tolerated:   °a. You may resume regular (light) daily activities beginning the next day--such as daily self-care, walking, climbing stairs--gradually increasing activities as tolerated.  If you can walk 30 minutes without difficulty, it   is safe to try more intense activity such as jogging, treadmill, bicycling, low-impact aerobics, swimming, etc. °b. Save the most intensive and strenuous activity for last such as sit-ups, heavy lifting, contact sports, etc  Refrain from any heavy lifting or straining until you are off narcotics for pain control.   °c. DO NOT PUSH THROUGH PAIN.  Let pain be your guide: If it hurts to do something, don't  do it.  Pain is your body warning you to avoid that activity for another week until the pain goes down. °d. You may drive when you are no longer taking prescription pain medication, you can comfortably wear a seatbelt, and you can safely maneuver your car and apply brakes. °e. You may have sexual intercourse when it is comfortable.  °9. FOLLOW UP in our office °a. Please call [facility] at [phone] to set up an appointment to see your surgeon in the office for a follow-up appointment approximately 2-3 weeks after your surgery. °b. Make sure that you call for this appointment the day you arrive home to insure a convenient appointment time. °10. IF YOU HAVE DISABILITY OR FAMILY LEAVE FORMS, BRING THEM TO THE OFFICE FOR PROCESSING.  DO NOT GIVE THEM TO YOUR DOCTOR. ° ° °WHEN TO CALL US [phone]: °1. Poor pain control °2. Reactions / problems with new medications (rash/itching, nausea, etc)  °3. Fever over 101.5 F (38.5 C) °4. Inability to urinate °5. Nausea and/or vomiting °6. Worsening swelling or bruising °7. Continued bleeding from incision. °8. Increased pain, redness, or drainage from the incision ° ° The clinic staff is available to answer your questions during regular business hours (8:30am-5pm).  Please don’t hesitate to call and ask to speak to one of our nurses for clinical concerns.  ° If you have a medical emergency, go to the nearest emergency room or call 911. ° A surgeon from [facility] is always on call at the hospitals ° ° °[facility] °[address], [address], [place], [place]  [address] ? °MAIN: [phone] ? TOLL FREE: [phone] ?  °FAX [phone] °[email or web address] ° °Cholecystitis °Cholecystitis is inflammation of the gallbladder. It is often called a gallbladder attack. The gallbladder is a pear-shaped organ that lies beneath the liver on the right side of the body. The gallbladder stores bile, which is a fluid that helps the body  to digest fats. If bile builds up in your gallbladder, your gallbladder becomes inflamed. This condition may occur suddenly (be acute). Repeat episodes of acute cholecystitis or prolonged episodes may lead to a long-term (chronic) condition. Cholecystitis is serious and it requires treatment.  °CAUSES °The most common cause of this condition is gallstones. Gallstones can block the tube (duct) that carries bile out of your gallbladder. This causes bile to build up. Other causes of this condition include: °· Damage to the gallbladder due to a decrease in blood flow. °· Infections in the bile ducts. °· Scars or kinks in the bile ducts. °· Tumors in the liver, pancreas, or gallbladder. °RISK FACTORS °This condition is more likely to develop in: °· People who have sickle cell disease. °· People who take birth control pills or use estrogen. °· People who have alcoholic liver disease. °· People who have liver cirrhosis. °· People who have their nutrition delivered through a vein (parenteral nutrition). °· People who do not eat or drink (do fasting) for a long period of time. °· People who are obese. °· People who have   rapid weight loss. °· People who are pregnant. °· People who have increased triglyceride levels. °· People who have pancreatitis. °SYMPTOMS °Symptoms of this condition include: °· Abdominal pain, especially in the upper right area of the abdomen. °· Abdominal tenderness or bloating. °· Nausea. °· Vomiting. °· Fever. °· Chills. °· Yellowing of the skin and the whites of the eyes (jaundice). °DIAGNOSIS °This condition is diagnosed with a medical history and physical exam. You may also have other tests, including: °· Imaging tests, such as: °¨ An ultrasound of the gallbladder. °¨ A CT scan of the abdomen. °¨ A gallbladder nuclear scan (HIDA scan). This scan allows your health care provider to see the bile moving from your liver to your gallbladder and to your small intestine. °¨ MRI. °· Blood tests, such  as: °¨ A complete blood count, because the white blood cell count may be higher than normal. °¨ Liver function tests, because some levels may be higher than normal with certain types of gallstones. °TREATMENT °Treatment may include: °· Fasting for a certain amount of time. °· IV fluids. °· Medicine to treat pain or vomiting. °· Antibiotic medicine. °· Surgery to remove your gallbladder (cholecystectomy). This may happen immediately or at a later time. °HOME CARE INSTRUCTIONS °Home care will depend on your treatment. In general: °· Take over-the-counter and prescription medicines only as told by your health care provider. °· If you were prescribed an antibiotic medicine, take it as told by your health care provider. Do not stop taking the antibiotic even if you start to feel better. °· Follow instructions from your health care provider about what to eat or drink. When you are allowed to eat, avoid eating or drinking anything that triggers your symptoms. °· Keep all follow-up visits as told by your health care provider. This is important. °SEEK MEDICAL CARE IF: °· Your pain is not controlled with medicine. °· You have a fever. °SEEK IMMEDIATE MEDICAL CARE IF: °· Your pain moves to another part of your abdomen or to your back. °· You continue to have symptoms or you develop new symptoms even with treatment. °  °This information is not intended to replace advice given to you by your health care provider. Make sure you discuss any questions you have with your health care provider. °  °Document Released: 07/08/2005 Document Revised: 03/29/2015 Document Reviewed: 10/19/2014 °Elsevier Interactive Patient Education ©2016 [employer] ° °Managing Pain ° °Pain after surgery or related to activity is often due to strain/injury to muscle, tendon, nerves and/or incisions.  This pain is usually short-term and will improve in a few months.  ° °Many people find it helpful to do the following things TOGETHER to help speed the  process of healing and to get back to regular activity more quickly: ° °1. Avoid heavy physical activity at first °a. No lifting greater than 20 pounds at first, then increase to lifting as tolerated over the next few weeks °b. Do not “push through” the pain.  Listen to your body and avoid positions and maneuvers than reproduce the pain.  Wait a few days before trying something more intense °c. Walking is okay as tolerated, but go slowly and stop when getting sore.  If you can walk 30 minutes without stopping or pain, you can try more intense activity (running, jogging, aerobics, cycling, swimming, treadmill, sex, sports, weightlifting, etc ) °d. Remember: If it hurts to do it, then don’t do it! ° °2. Take Anti-inflammatory medication °a. Choose ONE of the following   over-the-counter medications: °i.            Acetaminophen 500mg tabs (Tylenol) 1-2 pills with every meal and just before bedtime (avoid if you have liver problems) °ii.            Naproxen 220mg tabs (ex. Aleve) 1-2 pills twice a day (avoid if you have kidney, stomach, IBD, or bleeding problems) °iii. Ibuprofen 200mg tabs (ex. Advil, Motrin) 3-4 pills with every meal and just before bedtime (avoid if you have kidney, stomach, IBD, or bleeding problems) °b. Take with food/snack around the clock for 1-2 weeks °i. This helps the muscle and nerve tissues become less irritable and calm down faster ° °3. Use a Heating pad or Ice/Cold Pack °a. 4-6 times a day °b. May use warm bath/hottub  or showers ° °4. Try Gentle Massage and/or Stretching  °a. at the area of pain many times a day °b. stop if you feel pain - do not overdo it ° °Try these steps together to help you body heal faster and avoid making things get worse.  Doing just one of these things may not be enough.   ° °If you are not getting better after two weeks or are noticing you are getting worse, contact our office for further advice; we may need to re-evaluate you & see what other things we can do to  help. ° °GETTING TO GOOD BOWEL HEALTH. °Irregular bowel habits such as constipation and diarrhea can lead to many problems over time.  Having one soft bowel movement a day is the most important way to prevent further problems.  The anorectal canal is designed to handle stretching and feces to safely manage our ability to get rid of solid waste (feces, poop, stool) out of our body.  BUT, hard constipated stools can act like ripping concrete bricks and diarrhea can be a burning fire to this very sensitive area of our body, causing inflamed hemorrhoids, anal fissures, increasing risk is perirectal abscesses, abdominal pain/bloating, an making irritable bowel worse.     ° °The goal: ONE SOFT BOWEL MOVEMENT A DAY!  To have soft, regular bowel movements:  °• Drink plenty of fluids, consider 4-6 tall glasses of water a day.   °• Take plenty of fiber.  Fiber is the undigested part of plant food that passes into the colon, acting s “natures broom” to encourage bowel motility and movement.  Fiber can absorb and hold large amounts of water. This results in a larger, bulkier stool, which is soft and easier to pass. Work gradually over several weeks up to 6 servings a day of fiber (25g a day even more if needed) in the form of: °o Vegetables -- Root (potatoes, carrots, turnips), leafy green (lettuce, salad greens, celery, spinach), or cooked high residue (cabbage, broccoli, etc) °o Fruit -- Fresh (unpeeled skin & pulp), Dried (prunes, apricots, cherries, etc ),  or stewed ( applesauce)  °o Whole grain breads, pasta, etc (whole wheat)  °o Bran cereals  °• Bulking Agents -- This type of water-retaining fiber generally is easily obtained each day by one of the following:  °o Psyllium bran -- The psyllium plant is remarkable because its ground seeds can retain so much water. This product is available as Metamucil, Konsyl, Effersyllium, Per Diem Fiber, or the less expensive generic preparation in drug and health food stores. Although  labeled a laxative, it really is not a laxative.  °o Methylcellulose -- This is another fiber derived from wood which also   retains water. It is available as Citrucel. °o Polyethylene Glycol - and “artificial” fiber commonly called Miralax or Glycolax.  It is helpful for people with gassy or bloated feelings with regular fiber °o Flax Seed - a less gassy fiber than psyllium °• No reading or other relaxing activity while on the toilet. If bowel movements take longer than 5 minutes, you are too constipated °• AVOID CONSTIPATION.  High fiber and water intake usually takes care of this.  Sometimes a laxative is needed to stimulate more frequent bowel movements, but  °• Laxatives are not a good long-term solution as it can wear the colon out.  They can help jump-start bowels if constipated, but should be relied on constantly without discussing with your doctor °o Osmotics (Milk of Magnesia, Fleets phosphosoda, Magnesium citrate, MiraLax, GoLytely) are safer than  °o Stimulants (Senokot, Castor Oil, Dulcolax, Ex Lax)    °o Avoid taking laxatives for more than 7 days in a row. °•  IF SEVERELY CONSTIPATED, try a Bowel Retraining Program: °o Do not use laxatives.  °o Eat a diet high in roughage, such as bran cereals and leafy vegetables.  °o Drink six (6) ounces of prune or apricot juice each morning.  °o Eat two (2) large servings of stewed fruit each day.  °o Take one (1) heaping tablespoon of a psyllium-based bulking agent twice a day. Use sugar-free sweetener when possible to avoid excessive calories.  °o Eat a normal breakfast.  °o Set aside 15 minutes after breakfast to sit on the toilet, but do not strain to have a bowel movement.  °o If you do not have a bowel movement by the third day, use an enema and repeat the above steps.  °• Controlling diarrhea °o Switch to liquids and simpler foods for a few days to avoid stressing your intestines further. °o Avoid dairy products (especially milk & ice cream) for a short time.   The intestines often can lose the ability to digest lactose when stressed. °o Avoid foods that cause gassiness or bloating.  Typical foods include beans and other legumes, cabbage, broccoli, and dairy foods.  Every person has some sensitivity to other foods, so listen to our body and avoid those foods that trigger problems for you. °o Adding fiber (Citrucel, Metamucil, psyllium, Miralax) gradually can help thicken stools by absorbing excess fluid and retrain the intestines to act more normally.  Slowly increase the dose over a few weeks.  Too much fiber too soon can backfire and cause cramping & bloating. °o Probiotics (such as active yogurt, Align, etc) may help repopulate the intestines and colon with normal bacteria and calm down a sensitive digestive tract.  Most studies show it to be of mild help, though, and such products can be costly. °o Medicines: °- Bismuth subsalicylate (ex. Kayopectate, Pepto Bismol) every 30 minutes for up to 6 doses can help control diarrhea.  Avoid if pregnant. °- Loperamide (Immodium) can slow down diarrhea.  Start with two tablets (4mg total) first and then try one tablet every 6 hours.  Avoid if you are having fevers or severe pain.  If you are not better or start feeling worse, stop all medicines and call your doctor for advice °o Call your doctor if you are getting worse or not better.  Sometimes further testing (cultures, endoscopy, X-ray studies, bloodwork, etc) may be needed to help diagnose and treat the cause of the diarrhea. ° °TROUBLESHOOTING IRREGULAR BOWELS °1) Avoid extremes of bowel movements (no bad constipation/diarrhea) °  2) Miralax 17gm mixed in 8oz. water or juice-daily. May use BID as needed.  °3) Gas-x,Phazyme, etc. as needed for gas & bloating.  °4) Soft,bland diet. No spicy,greasy,fried foods.  °5) Prilosec over-the-counter as needed  °6) May hold gluten/wheat products from diet to see if symptoms improve.  °7)  May try probiotics (Align, Activa, etc) to help  calm the bowels down °7) If symptoms become worse call back immediately. ° ° °

## 2015-06-14 NOTE — Anesthesia Preprocedure Evaluation (Addendum)
Anesthesia Evaluation  Patient identified by MRN, date of birth, ID band Patient awake    Reviewed: Allergy & Precautions, NPO status , Patient's Chart, lab work & pertinent test results  History of Anesthesia Complications Negative for: history of anesthetic complications  Airway Mallampati: II  TM Distance: >3 FB Neck ROM: Full    Dental  (+) Dental Advisory Given, Caps, Missing All upper front are capped. Missing some side teeth:   Pulmonary COPD,  COPD inhaler, former smoker,    Pulmonary exam normal        Cardiovascular hypertension, Pt. on medications and Pt. on home beta blockers + CAD  Normal cardiovascular exam  Cath 7/16 mild CAD   Neuro/Psych  Headaches, negative psych ROS   GI/Hepatic Neg liver ROS, GERD  Medicated and Controlled,  Endo/Other  negative endocrine ROS  Renal/GU Renal InsufficiencyRenal diseasenegative Renal ROSStage 3 kidney disease. CRT 2.71  negative genitourinary   Musculoskeletal  (+) Arthritis ,   Abdominal   Peds negative pediatric ROS (+)  Hematology negative hematology ROS (+)   Anesthesia Other Findings   Reproductive/Obstetrics negative OB ROS                         Anesthesia Physical Anesthesia Plan  ASA: III  Anesthesia Plan: General   Post-op Pain Management:    Induction: Intravenous  Airway Management Planned: Oral ETT  Additional Equipment:   Intra-op Plan:   Post-operative Plan: Extubation in OR  Informed Consent: I have reviewed the patients History and Physical, chart, labs and discussed the procedure including the risks, benefits and alternatives for the proposed anesthesia with the patient or authorized representative who has indicated his/her understanding and acceptance.   Dental advisory given  Plan Discussed with: CRNA and Surgeon  Anesthesia Plan Comments:       Anesthesia Quick Evaluation

## 2015-06-14 NOTE — Progress Notes (Signed)
[facility] PROGRESS NOTE  Preston Boyd ZOX:[id number] DOB: 10-31-1940 DOA: 06/13/2015 PCP: Rene Paci, MD  Assessment/Plan: Principal Problem:   Cholecystitis -Gen. surgery consulted and currently managing. As for cholecystectomy - started on antibiotics for this. If no reported purulence during operation will plan on discontinuing.  Active Problems:   Hypertension - We'll continue Imdur and metoprolol once able    COPD (chronic obstructive pulmonary disease) (HCC) -  stable and compensated     CKD (chronic kidney disease) stage 3, GFR 30-59 ml/min - Stable    CAD (coronary artery disease) - Stable no chest pain reported    Hyponatremia - Resolved    Elevated bilirubin   Dehydration - We'll continue to improve once patient's oral intake improves once he has cholecystectomy.   Code Status:  full  Family Communication: d/c patient Disposition Plan: pending Gen surg recs   Consultants:  Gen surgery  Procedures:  Pending  Antibiotics:  Rocephin and metronidazole  HPI/Subjective: Pt has no new complaints. Looking forward to eating  Objective: Filed Vitals:   06/14/15 0445 06/14/15 1157  BP: 112/60 116/68  Pulse: 60 64  Temp: 99.2 F (37.3 C) 98 F (36.7 C)  Resp: 16 18    Intake/Output Summary (Last 24 hours) at 06/14/15 1657 Last data filed at 06/14/15 8119  Gross per 24 hour  Intake   1369 ml  Output      7 ml  Net   1362 ml   Filed Weights   06/13/15 1558  Weight: 99.338 kg (219 lb)    Exam:   General:  Pt in nad, alert and awake  Cardiovascular: no cyanosis  Respiratory: no increased wob, no wheezes  Abdomen: no guarding  Musculoskeletal: no cyanosis   Data Reviewed: Basic Metabolic Panel:  Recent Labs Lab 06/12/15 1038 06/14/15 0430  NA 131* 137  K 4.2 4.2  CL 94* 103  CO2 25 25  GLUCOSE 88 97  BUN 43* 54*  CREATININE 2.67* 2.71*  CALCIUM 9.5 8.6*   Liver Function Tests:  Recent Labs Lab  06/12/15 1038 06/14/15 0430  AST 20 31  ALT 44 39  ALKPHOS 55 64  BILITOT 1.8* 1.0  PROT 7.4 6.3*  ALBUMIN 3.9 3.2*    Recent Labs Lab 06/13/15 1908 06/14/15 0430  LIPASE 30 54*   No results for input(s): AMMONIA in the last 168 hours. CBC:  Recent Labs Lab 06/13/15 1020 06/14/15 0430  WBC 15.4* 8.2  NEUTROABS 12.2*  --   HGB 16.3 13.4  HCT 48.9 40.7  MCV 93.5 92.7  PLT PLATELET CLUMPS NOTED ON SMEAR, UNABLE TO ESTIMATE 211   Cardiac Enzymes: No results for input(s): CKTOTAL, CKMB, CKMBINDEX, TROPONINI in the last 168 hours. BNP (last 3 results) No results for input(s): BNP in the last 8760 hours.  ProBNP (last 3 results) No results for input(s): PROBNP in the last 8760 hours.  CBG: No results for input(s): GLUCAP in the last 168 hours.  Recent Results (from the past 240 hour(s))  Surgical pcr screen     Status: None   Collection Time: 06/13/15 11:14 PM  Result Value Ref Range Status   MRSA, PCR NEGATIVE NEGATIVE Final   Staphylococcus aureus NEGATIVE NEGATIVE Final    Comment:        The Xpert SA Assay (FDA approved for NASAL specimens in patients over 67 years of age), is one component of a comprehensive surveillance program.  Test performance has been validated by [facility] for  patients greater than or equal to 73 year old. It is not intended to diagnose infection nor to guide or monitor treatment.      Studies: Dg Chest Port 1 View  06/13/2015  CLINICAL DATA:  Shortness of breath with cough EXAM: PORTABLE CHEST 1 VIEW COMPARISON:  12/01/2014 FINDINGS: Slightly lower lung volumes with mild elevation of the right hemidiaphragm. Normal heart size and vascularity. No acute pneumonia, collapse or consolidation. Negative for edema, effusion or pneumothorax. Trachea midline. No acute osseous finding. IMPRESSION: Lower lung volumes but no acute process. Electronically Signed   By: Judie PetitM.  Shick M.D.   On: 06/13/2015 17:11    Scheduled Meds: . [MAR Hold]  cefTRIAXone (ROCEPHIN)  IV  2 g Intravenous Q24H  . [MAR Hold] isosorbide mononitrate  30 mg Oral Daily  . [MAR Hold] metoprolol tartrate  12.5 mg Oral BID  . [MAR Hold] metroNIDAZOLE  500 mg Oral 3 times per day  . [MAR Hold] mometasone-formoterol  2 puff Inhalation BID   Continuous Infusions: . sodium chloride 1,000 mL (06/14/15 0354)    Time spent: > 35 minutes   [name], Cieara Stierwalt  [facility] Pager [phone]. If 7PM-7AM, please contact night-coverage at [email or web address], password [email or web address] 06/14/2015, 4:57 PM  LOS: 1 day

## 2015-06-14 NOTE — Anesthesia Procedure Notes (Signed)
Procedure Name: Intubation Date/Time: 06/14/2015 4:15 PM Performed by: Elyn PeersALLEN, Teva Bronkema J Pre-anesthesia Checklist: Patient identified, Emergency Drugs available, Suction available, Patient being monitored and Timeout performed Patient Re-evaluated:Patient Re-evaluated prior to inductionOxygen Delivery Method: Circle system utilized Preoxygenation: Pre-oxygenation with 100% oxygen Intubation Type: IV induction Ventilation: Mask ventilation without difficulty Laryngoscope Size: Miller and 3 Grade View: Grade I Tube type: Oral Tube size: 7.5 mm Number of attempts: 1 Airway Equipment and Method: Stylet Placement Confirmation: ETT inserted through vocal cords under direct vision,  positive ETCO2 and breath sounds checked- equal and bilateral Secured at: 22 cm Tube secured with: Tape Dental Injury: Teeth and Oropharynx as per pre-operative assessment

## 2015-06-14 NOTE — Progress Notes (Signed)
[facility] Progress Note     Subjective: Pt feels a bit better today.  No N/V, pain well controlled.  Ambulating well.  Anxious to proceed with surgery and eat something.  Objective: Vital signs in last 24 hours: Temp:  [98.5 F (36.9 C)-99.2 F (37.3 C)] 99.2 F (37.3 C) (11/23 0445) Pulse Rate:  [60-75] 60 (11/23 0445) Resp:  [16-18] 16 (11/23 0445) BP: (109-144)/(52-62) 112/60 mmHg (11/23 0445) SpO2:  [97 %-99 %] 97 % (11/23 0445) Weight:  [99.338 kg (219 lb)] 99.338 kg (219 lb) (11/22 1558) Last BM Date: 06/11/15  Intake/Output from previous day:   Intake/Output this shift:    PE: Gen:  Alert, NAD, pleasant Abd: Obese, soft, ND, tender in RUQ/epigastrium, +BS, no HSM, b/l inguinal scars well healed  Lab Results:   Recent Labs  06/13/15 1020 06/14/15 0430  WBC 15.4* 8.2  HGB 16.3 13.4  HCT 48.9 40.7  PLT PLATELET CLUMPS NOTED ON SMEAR, UNABLE TO ESTIMATE 211   BMET  Recent Labs  06/12/15 1038 06/14/15 0430  NA 131* 137  K 4.2 4.2  CL 94* 103  CO2 25 25  GLUCOSE 88 97  BUN 43* 54*  CREATININE 2.67* 2.71*  CALCIUM 9.5 8.6*   PT/INR  Recent Labs  06/14/15 0430  LABPROT 15.7*  INR 1.24   CMP     Component Value Date/Time   NA 137 06/14/2015 0430   K 4.2 06/14/2015 0430   CL 103 06/14/2015 0430   CO2 25 06/14/2015 0430   GLUCOSE 97 06/14/2015 0430   BUN 54* 06/14/2015 0430   CREATININE 2.71* 06/14/2015 0430   CALCIUM 8.6* 06/14/2015 0430   CALCIUM 9.1 08/19/2011 1553   PROT 6.3* 06/14/2015 0430   ALBUMIN 3.2* 06/14/2015 0430   AST 31 06/14/2015 0430   ALT 39 06/14/2015 0430   ALKPHOS 64 06/14/2015 0430   BILITOT 1.0 06/14/2015 0430   GFRNONAA 22* 06/14/2015 0430   GFRAA 25* 06/14/2015 0430   Lipase     Component Value Date/Time   LIPASE 54* 06/14/2015 0430       Studies/Results: US Abdomen Complete  06/12/2015  CLINICAL DATA:  Right upper quadrant pain EXAM: ULTRASOUND ABDOMEN COMPLETE COMPARISON:  None.  FINDINGS: Gallbladder: There are 2 gallstones towards the neck of the gallbladder. The larger measures 2.3 cm. Gallbladder wall is thickened at 6 mm. Sonographic Murphy sign is positive. Common bile duct: Diameter: 9 mm Liver: Diffusely increased in echogenicity without focal mass. No intrahepatic biliary dilatation. IVC: No abnormality visualized. Pancreas: No pancreatic head mass. The pancreatic duct is upper normal in caliber at 3.7 mm. Spleen: Size and appearance within normal limits. Right Kidney: Length: 11.2 cm. Echogenicity within normal limits. No mass or hydronephrosis visualized. Left Kidney: Length: 11.2 cm. Echogenicity within normal limits. No mass or hydronephrosis visualized. Abdominal aorta: No aneurysm visualized. Other findings: None. IMPRESSION: Cholelithiasis There is wall thickening in a positive sonographic Murphy sign suggesting acute cholecystitis. Correlate clinically as for the need for nuclear medicine imaging. The common bile duct is dilated. Developing biliary obstruction is not excluded. Correlate clinically as for the need for MRCP or ERCP. Diffuse hepatic steatosis. Electronically Signed   By: Jolaine Click M.D.   On: 06/12/2015 15:01   Dg Chest Port 1 View  06/13/2015  CLINICAL DATA:  Shortness of breath with cough EXAM: PORTABLE CHEST 1 VIEW COMPARISON:  12/01/2014 FINDINGS: Slightly lower lung volumes with mild elevation of the right hemidiaphragm. Normal heart size and  vascularity. No acute pneumonia, collapse or consolidation. Negative for edema, effusion or pneumothorax. Trachea midline. No acute osseous finding. IMPRESSION: Lower lung volumes but no acute process. Electronically Signed   By: Judie PetitM.  Shick M.D.   On: 06/13/2015 17:11    Anti-infectives: Anti-infectives    Start     Dose/Rate Route Frequency Ordered Stop   06/13/15 1700  cefTRIAXone (ROCEPHIN) 2 g in dextrose 5 % 50 mL IVPB    Comments:  Pharmacy may adjust dosing strength / duration / interval for maximal  efficacy   2 g 100 mL/hr over 30 Minutes Intravenous Every 24 hours 06/13/15 1639     06/13/15 1615  ciprofloxacin (CIPRO) IVPB 400 mg  Status:  Discontinued     400 mg 200 mL/hr over 60 Minutes Intravenous Every 12 hours 06/13/15 1608 06/13/15 1639   06/13/15 1615  metroNIDAZOLE (FLAGYL) tablet 500 mg     500 mg Oral 3 times per day 06/13/15 1608         Assessment/Plan Acute calculus cholecystitis Elevated bili with CBD dilatation - Bili normal today Leukocytosis - improved  Acute on CKD - Cr. Up a bit to 2.71 H/o CAD  Plan: 1. Labs improving, to OR today for lap chole with IOC 2. Antibiotics Rocephin/flagyl 3. NPO 4. Ambulate and IS 5. CXR and ECG look good 6. Talked with Dr. Darnelle Catalanama yesterday, she doesn't recommend pre-op clearance given recent lexiscan and cath done in July 2016    LOS: 1 day    Nonie HoyerMegan N Marybelle Giraldo 06/14/2015, 7:53 AM Pager: [phone]

## 2015-06-14 NOTE — Anesthesia Postprocedure Evaluation (Signed)
Anesthesia Post Note  Patient: Preston Boyd  Procedure(s) Performed: Procedure(s) (LRB): LAPAROSCOPIC CHOLECYSTECTOMY WITH INTRAOPERATIVE CHOLANGIOGRAM (N/A)  Patient location during evaluation: PACU Anesthesia Type: General Level of consciousness: awake and alert Pain management: pain level controlled Vital Signs Assessment: post-procedure vital signs reviewed and stable Respiratory status: spontaneous breathing, nonlabored ventilation, respiratory function stable and patient connected to nasal cannula oxygen Cardiovascular status: blood pressure returned to baseline and stable Postop Assessment: No signs of nausea or vomiting Anesthetic complications: no    Last Vitals:  Filed Vitals:   06/14/15 1841 06/14/15 1849  BP: 136/74 136/64  Pulse: 83 80  Temp:  36.3 C  Resp: 14 15    Last Pain:  Filed Vitals:   06/14/15 1851  PainSc: 8                  Preston Boyd,Preston Boyd

## 2015-06-14 NOTE — Transfer of Care (Signed)
Immediate Anesthesia Transfer of Care Note  Patient: Preston Boyd  Procedure(s) Performed: Procedure(s): LAPAROSCOPIC CHOLECYSTECTOMY WITH INTRAOPERATIVE CHOLANGIOGRAM (N/A)  Patient Location: PACU  Anesthesia Type:General  Level of Consciousness: awake, alert  and oriented  Airway & Oxygen Therapy: Patient Spontanous Breathing and Patient connected to face mask oxygen  Post-op Assessment: Report given to RN, Post -op Vital signs reviewed and stable and c/o pain.  Post vital signs: Reviewed and stable  Last Vitals:  Filed Vitals:   06/14/15 0445 06/14/15 1157  BP: 112/60 116/68  Pulse: 60 64  Temp: 37.3 C 36.7 C  Resp: 16 18    Complications: No apparent anesthesia complications

## 2015-06-14 NOTE — Op Note (Signed)
06/14/2015  6:00 PM  PATIENT:  Preston Boyd  74 y.o. male  Patient Care Team: Newt Lukes, MD as PCP - General Jodi Geralds, MD as Consulting Physician (Orthopedic Surgery) Estrella Deeds, OD as Physician Assistant (Optometry) [facility] Harle Stanford., MD as Consulting Physician (Urology) Lennette Bihari, MD (Cardiology)  PRE-OPERATIVE DIAGNOSIS:  cholecystitis  POST-OPERATIVE DIAGNOSIS:  cholecystitis  PROCEDURE:  Procedure(s): LAPAROSCOPIC CHOLECYSTECTOMY WITH INTRAOPERATIVE CHOLANGIOGRAM  SURGEON:  Surgeon(s): Karie Soda, MD  ASSISTANT: RN   ANESTHESIA:   local and general  EBL:     Delay start of Pharmacological VTE agent (>24hrs) due to surgical blood loss or risk of bleeding:  no  DRAINS: none   SPECIMEN:  Source of Specimen:  Gallbladder  DISPOSITION OF SPECIMEN:  PATHOLOGY  COUNTS:  YES  PLAN OF CARE: Admit to inpatient   PATIENT DISPOSITION:  PACU - hemodynamically stable.  INDICATION: Pleasant obese male with coronary disease with intermittent episodes of epigastric and right upper quadrant abdominal pain.  Admitted with cholecystitis.  Cleared by medicine.  Felt to benefit from cholecystectomy.  The anatomy & physiology of hepatobiliary & pancreatic function was discussed.  The pathophysiology of gallbladder dysfunction was discussed.  Natural history risks without surgery was discussed.   I feel the risks of no intervention will lead to serious problems that outweigh the operative risks; therefore, I recommended cholecystectomy to remove the pathology.  I explained laparoscopic techniques with possible need for an open approach.  Probable cholangiogram to evaluate the bilary tract was explained as well.    Risks such as bleeding, infection, abscess, leak, injury to other organs, need for further treatment, heart attack, death, and other risks were discussed.  I noted a good likelihood this will help address the problem.  Possibility that this will  not correct all abdominal symptoms was explained.  Goals of post-operative recovery were discussed as well.  We will work to minimize complications.  An educational handout further explaining the pathology and treatment options was given as well.  Questions were answered.  The patient expresses understanding & wishes to proceed with surgery.  OR FINDINGS: Acute on chronic inflammation of the gallbladder.  Very thickened gallbladder with early empyema.  No necrosis yet.  Cholangiogram showed mildly dilated biliary anatomy but no evidence of any choledocholithiasis.  No leak.  No obstruction.  DESCRIPTION:   The patient was identified & brought into the operating room. The patient was positioned supine with arms tucked. SCDs were active during the entire case. The patient underwent general anesthesia without any difficulty.  The abdomen was prepped and draped in a sterile fashion. A Surgical Timeout confirmed our plan.  We positioned the patient in reverse [place] & right side up.  I placed a 5mm laparoscopic port through the abdominal wall using optical entry technique in the right upper quadrant.  Entry was clean.  We induced carbon dioxide insufflation. Camera inspection revealed no injury. There were no adhesions to the anterior abdominal wall supraumbilically.  I proceeded to continue with laparoscopic technique. I placed a #5 port in supraumbilical region, another 5mm port in the right flank near the anterior axillary line, and a 10mm port in the left subxiphoid region obliquely within the falciform ligament.  I turned attention to the right upper quadrant.  Was unable to progress the gallbladder was surgically dilated and very thickened.  I aspirated some mildly purulent bile out of the gallbladder consistent with early empyema and cholecystitis.  The gallbladder fundus was  elevated cephalad. I used hook cautery to free the peritoneal coverings between the gallbladder and the liver on the  posteriolateral and anteriomedial walls.   I used careful blunt and hook dissection to help get a good critical view of the cystic artery and cystic duct. I did further dissection to free a few centimeters of the  gallbladder off the liver bed to get a good critical view of the infundibulum and cystic duct.  It was quite full of very thick large hard stones and paste.  I mobilized the cystic artery; and, after getting a good 360 view, ligated the cystic artery using clips. I skeletonized the cystic duct.  I placed a clip on the infundibulum. I did a partial cystic duct-otomy and ensured patency. I placed a 5 [place] cholangiocatheter through a puncture site at the right subcostal ridge of the abdominal wall and directed it into the cystic duct.  We ran a cholangiogram with dilute radio-opaque contrast and continuous fluoroscopy. Contrast flowed from a side branch consistent with cystic duct cannulization. Contrast flowed up the common hepatic duct into the right and left intrahepatic chains out to secondary radicals. Contrast flowed down the common bile duct easily across the normal ampulla into the duodenum.  This was consistent with a normal cholangiogram.  I removed the cholangiocatheter. I placed clips on the cystic duct x4.  I completed cystic duct transection. I freed the gallbladder from its remaining attachments to the liver.   Gallbladder was quite thick and inflamed and intrahepatic.  I ensured hemostasis on the gallbladder fossa of the liver and elsewhere.  And up using a wrist cellulose powder on the gallbladder fossa to help ensure good patching and hemostasis of the gallbladder fossa.  I inspected the rest of the abdomen & detected no injury nor bleeding elsewhere.  I removed the gallbladder out the subxiphoid port.  I had to extend the incision to 4 cm to get the gallbladder out as it was very thickened with numerous large stones.  I closed the subxiphoid fascia transversely using #1 running  PDS suture.  I closed the skin using 4-0 monocryl stitch.  Sterile dressings were applied. The patient was extubated & arrived in the PACU in stable condition..  I had discussed postoperative care with the patient in the holding area.  I am about to locate the patient's family and discuss operative findings and postoperative goals / instructions.  Instructions are written in the chart as well.  Ardeth SportsmanSteven C. Telicia Hodgkiss, M.D., F.A.C.S. Gastrointestinal and Minimally Invasive Surgery [facility] [address] [place], [place] [id number] [phone] Main / Paging

## 2015-06-15 DIAGNOSIS — K812 Acute cholecystitis with chronic cholecystitis: Secondary | ICD-10-CM

## 2015-06-15 LAB — COMPREHENSIVE METABOLIC PANEL
ALBUMIN: 2.8 g/dL — AB (ref 3.5–5.0)
ALT: 67 U/L — ABNORMAL HIGH (ref 17–63)
AST: 95 U/L — AB (ref 15–41)
Alkaline Phosphatase: 81 U/L (ref 38–126)
Anion gap: 8 (ref 5–15)
BUN: 36 mg/dL — AB (ref 6–20)
CHLORIDE: 104 mmol/L (ref 101–111)
CO2: 26 mmol/L (ref 22–32)
Calcium: 8.4 mg/dL — ABNORMAL LOW (ref 8.9–10.3)
Creatinine, Ser: 1.73 mg/dL — ABNORMAL HIGH (ref 0.61–1.24)
GFR calc Af Amer: 43 mL/min — ABNORMAL LOW (ref >60)
GFR, EST NON AFRICAN AMERICAN: 37 mL/min — AB (ref >60)
Glucose, Bld: 95 mg/dL (ref 65–99)
POTASSIUM: 4.4 mmol/L (ref 3.5–5.1)
Sodium: 138 mmol/L (ref 135–145)
Total Bilirubin: 1.1 mg/dL (ref 0.3–1.2)
Total Protein: 5.9 g/dL — ABNORMAL LOW (ref 6.5–8.1)

## 2015-06-15 LAB — CBC
HEMATOCRIT: 40.7 % (ref 39.0–52.0)
HEMOGLOBIN: 13.4 g/dL (ref 13.0–17.0)
MCH: 30.5 pg (ref 26.0–34.0)
MCHC: 32.9 g/dL (ref 30.0–36.0)
MCV: 92.5 fL (ref 78.0–100.0)
PLATELETS: 212 10*3/uL (ref 150–400)
RBC: 4.4 MIL/uL (ref 4.22–5.81)
RDW: 13.3 % (ref 11.5–15.5)
WBC: 8.5 10*3/uL (ref 4.0–10.5)

## 2015-06-15 MED ORDER — HYDROCODONE-ACETAMINOPHEN 5-325 MG PO TABS: 1.0000 | ORAL_TABLET | Freq: Four times a day (QID) | ORAL | Status: DC | PRN

## 2015-06-15 NOTE — Progress Notes (Signed)
Patient ID: Preston Boyd, male   DOB: 1940/12/13, 74 y.o.   MRN: [id number] [facility] Surgery Progress Note  1 Day Post-Op  Subjective: Doing OK, but still pretty sore.    Objective: Vital signs in last 24 hours: Temp:  [97.4 F (36.3 C)-99.9 F (37.7 C)] 99.9 F (37.7 C) (11/24 0847) Pulse Rate:  [64-89] 65 (11/24 0511) Resp:  [14-18] 18 (11/24 0847) BP: (114-161)/(64-85) 125/73 mmHg (11/24 0511) SpO2:  [94 %-100 %] 100 % (11/24 0511) Last BM Date: 06/11/15  Intake/Output from previous day: 11/23 0701 - 11/24 0700 In: 1450 [I.V.:1350; IV Piggyback:100] Out: 500 [Urine:500] Intake/Output this shift: Total I/O In: 360 [P.O.:360] Out: -   PE: Gen:  Alert, NAD, pleasant Abd: Obese, soft, approp tender.  Inc d/c/i.    Lab Results:   Recent Labs  06/14/15 0430 06/15/15 0445  WBC 8.2 8.5  HGB 13.4 13.4  HCT 40.7 40.7  PLT 211 212   BMET  Recent Labs  06/14/15 0430 06/15/15 0445  NA 137 138  K 4.2 4.4  CL 103 104  CO2 25 26  GLUCOSE 97 95  BUN 54* 36*  CREATININE 2.71* 1.73*  CALCIUM 8.6* 8.4*   PT/INR  Recent Labs  06/14/15 0430  LABPROT 15.7*  INR 1.24   CMP     Component Value Date/Time   NA 138 06/15/2015 0445   K 4.4 06/15/2015 0445   CL 104 06/15/2015 0445   CO2 26 06/15/2015 0445   GLUCOSE 95 06/15/2015 0445   BUN 36* 06/15/2015 0445   CREATININE 1.73* 06/15/2015 0445   CALCIUM 8.4* 06/15/2015 0445   CALCIUM 9.1 08/19/2011 1553   PROT 5.9* 06/15/2015 0445   ALBUMIN 2.8* 06/15/2015 0445   AST 95* 06/15/2015 0445   ALT 67* 06/15/2015 0445   ALKPHOS 81 06/15/2015 0445   BILITOT 1.1 06/15/2015 0445   GFRNONAA 37* 06/15/2015 0445   GFRAA 43* 06/15/2015 0445   Lipase     Component Value Date/Time   LIPASE 54* 06/14/2015 0430       Studies/Results: Dg Cholangiogram Operative  06/14/2015  CLINICAL DATA:  Right upper quadrant pain.  Cholecystectomy. EXAM: INTRAOPERATIVE CHOLANGIOGRAM TECHNIQUE: Cholangiographic images  from the C-arm fluoroscopic device were submitted for interpretation post-operatively. Please see the procedural report for the amount of contrast and the fluoroscopy time utilized. COMPARISON:  Ultrasound abdomen 06/12/2015 FINDINGS: Intraoperative fluoroscopy is obtained with during injection of contrast material into the bowel ducts. Fluoroscopy time is recorded at 16 seconds. A single spot fluoroscopic images obtained. Spot fluoroscopic image obtained of the right upper quadrant during injection of contrast material into the cystic duct. Surgical clips across the cystic duct. Intrahepatic ducts are not well visualized. No significant extrahepatic bile duct dilatation. No intraluminal filling defects demonstrated. Free flow of contrast material into the duodenum suggesting no evidence of obstruction. No stricture identified. Somewhat a amorphous density projected medial to the bowel ducts could represent extraluminal contrast material. IMPRESSION: No bile duct dilatation or common bile duct stones identified. Electronically Signed   By: Burman Nieves M.D.   On: 06/14/2015 18:19   Dg Chest Port 1 View  06/13/2015  CLINICAL DATA:  Shortness of breath with cough EXAM: PORTABLE CHEST 1 VIEW COMPARISON:  12/01/2014 FINDINGS: Slightly lower lung volumes with mild elevation of the right hemidiaphragm. Normal heart size and vascularity. No acute pneumonia, collapse or consolidation. Negative for edema, effusion or pneumothorax. Trachea midline. No acute osseous finding. IMPRESSION: Lower lung volumes but  no acute process. Electronically Signed   By: Judie PetitM.  Shick M.D.   On: 06/13/2015 17:11    Anti-infectives: Anti-infectives    Start     Dose/Rate Route Frequency Ordered Stop   06/15/15 1800  cefTRIAXone (ROCEPHIN) 2 g in dextrose 5 % 50 mL IVPB    Comments:  Pharmacy may adjust dosing strength / duration / interval for maximal efficacy   2 g 100 mL/hr over 30 Minutes Intravenous Every 24 hours 06/14/15 1936  06/17/15 1759   06/13/15 1700  cefTRIAXone (ROCEPHIN) 2 g in dextrose 5 % 50 mL IVPB  Status:  Discontinued    Comments:  Pharmacy may adjust dosing strength / duration / interval for maximal efficacy   2 g 100 mL/hr over 30 Minutes Intravenous Every 24 hours 06/13/15 1639 06/14/15 1936   06/13/15 1615  ciprofloxacin (CIPRO) IVPB 400 mg  Status:  Discontinued     400 mg 200 mL/hr over 60 Minutes Intravenous Every 12 hours 06/13/15 1608 06/13/15 1639   06/13/15 1615  metroNIDAZOLE (FLAGYL) tablet 500 mg     500 mg Oral 3 times per day 06/13/15 1608         Assessment/Plan Acute calculus cholecystitis Elevated bili with CBD dilatation - Bili normal today Leukocytosis - improved  Acute on CKD  H/o CAD  Plan: Probably not home until tomorrow.  Still pretty sore and lives alone.  Diet as tolerated.     LOS: 2 days    Brandalyn Harting 06/15/2015, 9:40 AM

## 2015-06-15 NOTE — Progress Notes (Signed)
[facility] PROGRESS NOTE  Preston BusmanCharles O Boyd GNF:[id number] DOB: 1941/01/20 DOA: 06/13/2015 PCP: Rene PaciValerie Leschber, MD  Assessment/Plan: Principal Problem:   Cholecystitis -Gen. surgery consulted and currently managing. - started on antibiotics for this. If no reported purulence during operation will plan on discontinuing.  Active Problems:   Hypertension - We'll continue Imdur and metoprolol once able    COPD (chronic obstructive pulmonary disease) (HCC) -  stable and compensated     CKD (chronic kidney disease) stage 3, GFR 30-59 ml/min - Stable    CAD (coronary artery disease) - Stable no chest pain reported    Hyponatremia - Resolved    Elevated bilirubin   Dehydration - We'll continue to improve once patient's oral intake improves once he has cholecystectomy.   Code Status:  full  Family Communication: d/c patient Disposition Plan: pending Gen surg recs, possibly next am.   Consultants:  Gen surgery  Procedures:  Pending  Antibiotics:  Rocephin and metronidazole  HPI/Subjective: Pt has no new complaints. Still sore  Objective: Filed Vitals:   06/15/15 0847 06/15/15 0950  BP:  130/70  Pulse:  72  Temp: 99.9 F (37.7 C)   Resp: 18     Intake/Output Summary (Last 24 hours) at 06/15/15 1415 Last data filed at 06/15/15 1400  Gross per 24 hour  Intake   4850 ml  Output   1100 ml  Net   3750 ml   Filed Weights   06/13/15 1558  Weight: 99.338 kg (219 lb)    Exam:   General:  Pt in nad, alert and awake  Cardiovascular: no cyanosis  Respiratory: no increased wob, no wheezes  Abdomen: no guarding, incisions intact  Musculoskeletal: no cyanosis   Data Reviewed: Basic Metabolic Panel:  Recent Labs Lab 06/12/15 1038 06/14/15 0430 06/15/15 0445  NA 131* 137 138  K 4.2 4.2 4.4  CL 94* 103 104  CO2 25 25 26   GLUCOSE 88 97 95  BUN 43* 54* 36*  CREATININE 2.67* 2.71* 1.73*  CALCIUM 9.5 8.6* 8.4*   Liver Function  Tests:  Recent Labs Lab 06/12/15 1038 06/14/15 0430 06/15/15 0445  AST 20 31 95*  ALT 44 39 67*  ALKPHOS 55 64 81  BILITOT 1.8* 1.0 1.1  PROT 7.4 6.3* 5.9*  ALBUMIN 3.9 3.2* 2.8*    Recent Labs Lab 06/13/15 1908 06/14/15 0430  LIPASE 30 54*   No results for input(s): AMMONIA in the last 168 hours. CBC:  Recent Labs Lab 06/13/15 1020 06/14/15 0430 06/15/15 0445  WBC 15.4* 8.2 8.5  NEUTROABS 12.2*  --   --   HGB 16.3 13.4 13.4  HCT 48.9 40.7 40.7  MCV 93.5 92.7 92.5  PLT PLATELET CLUMPS NOTED ON SMEAR, UNABLE TO ESTIMATE 211 212   Cardiac Enzymes: No results for input(s): CKTOTAL, CKMB, CKMBINDEX, TROPONINI in the last 168 hours. BNP (last 3 results) No results for input(s): BNP in the last 8760 hours.  ProBNP (last 3 results) No results for input(s): PROBNP in the last 8760 hours.  CBG: No results for input(s): GLUCAP in the last 168 hours.  Recent Results (from the past 240 hour(s))  Surgical pcr screen     Status: None   Collection Time: 06/13/15 11:14 PM  Result Value Ref Range Status   MRSA, PCR NEGATIVE NEGATIVE Final   Staphylococcus aureus NEGATIVE NEGATIVE Final    Comment:        The Xpert SA Assay (FDA approved for NASAL specimens in patients over  23 years of age), is one component of a comprehensive surveillance program.  Test performance has been validated by [facility] for patients greater than or equal to 30 year old. It is not intended to diagnose infection nor to guide or monitor treatment.      Studies: Dg Cholangiogram Operative  06/14/2015  CLINICAL DATA:  Right upper quadrant pain.  Cholecystectomy. EXAM: INTRAOPERATIVE CHOLANGIOGRAM TECHNIQUE: Cholangiographic images from the C-arm fluoroscopic device were submitted for interpretation post-operatively. Please see the procedural report for the amount of contrast and the fluoroscopy time utilized. COMPARISON:  Ultrasound abdomen 06/12/2015 FINDINGS: Intraoperative fluoroscopy  is obtained with during injection of contrast material into the bowel ducts. Fluoroscopy time is recorded at 16 seconds. A single spot fluoroscopic images obtained. Spot fluoroscopic image obtained of the right upper quadrant during injection of contrast material into the cystic duct. Surgical clips across the cystic duct. Intrahepatic ducts are not well visualized. No significant extrahepatic bile duct dilatation. No intraluminal filling defects demonstrated. Free flow of contrast material into the duodenum suggesting no evidence of obstruction. No stricture identified. Somewhat a amorphous density projected medial to the bowel ducts could represent extraluminal contrast material. IMPRESSION: No bile duct dilatation or common bile duct stones identified. Electronically Signed   By: Burman Nieves M.D.   On: 06/14/2015 18:19   Dg Chest Port 1 View  06/13/2015  CLINICAL DATA:  Shortness of breath with cough EXAM: PORTABLE CHEST 1 VIEW COMPARISON:  12/01/2014 FINDINGS: Slightly lower lung volumes with mild elevation of the right hemidiaphragm. Normal heart size and vascularity. No acute pneumonia, collapse or consolidation. Negative for edema, effusion or pneumothorax. Trachea midline. No acute osseous finding. IMPRESSION: Lower lung volumes but no acute process. Electronically Signed   By: Judie Petit.  Shick M.D.   On: 06/13/2015 17:11    Scheduled Meds: . aspirin EC  325 mg Oral Daily  . cefTRIAXone (ROCEPHIN)  IV  2 g Intravenous Q24H  . isosorbide mononitrate  30 mg Oral Daily  . lip balm  1 application Topical BID  . metoprolol tartrate  12.5 mg Oral BID  . metroNIDAZOLE  500 mg Oral 3 times per day  . mometasone-formoterol  2 puff Inhalation BID  . saccharomyces boulardii  250 mg Oral BID  . sodium chloride  3 mL Intravenous Q12H   Continuous Infusions: . sodium chloride 100 mL/hr at 06/15/15 0058    Time spent: > 35 minutes   Penny Pia  [facility] Pager [phone]. If 7PM-7AM,  please contact night-coverage at [email or web address], password [facility] 06/15/2015, 2:15 PM  LOS: 2 days

## 2015-06-16 ENCOUNTER — Encounter (HOSPITAL_COMMUNITY): Payer: Self-pay | Admitting: Surgery

## 2015-06-16 LAB — BASIC METABOLIC PANEL
ANION GAP: 8 (ref 5–15)
BUN: 22 mg/dL — ABNORMAL HIGH (ref 6–20)
CALCIUM: 8.5 mg/dL — AB (ref 8.9–10.3)
CO2: 20 mmol/L — ABNORMAL LOW (ref 22–32)
Chloride: 102 mmol/L (ref 101–111)
Creatinine, Ser: 1.55 mg/dL — ABNORMAL HIGH (ref 0.61–1.24)
GFR calc Af Amer: 50 mL/min — ABNORMAL LOW (ref >60)
GFR, EST NON AFRICAN AMERICAN: 43 mL/min — AB (ref >60)
GLUCOSE: 151 mg/dL — AB (ref 65–99)
Potassium: 4 mmol/L (ref 3.5–5.1)
Sodium: 130 mmol/L — ABNORMAL LOW (ref 135–145)

## 2015-06-16 NOTE — Progress Notes (Signed)
[facility] PROGRESS NOTE  Preston Boyd ZOX:[id number] DOB: 12/07/1940 DOA: 06/13/2015 PCP: Rene Paci, MD  Assessment/Plan: Principal Problem:   Cholecystitis -Gen. surgery consulted and currently managing. - Patient will need 7 days of antibiotics for early empyema. As such once patient tolerates solids well will plan on switching to oral antibiotics and discharge. Most likely next am.  Active Problems:   Hypertension - We'll continue Imdur and metoprolol once able    COPD (chronic obstructive pulmonary disease) (HCC) -  stable and compensated     CKD (chronic kidney disease) stage 3, GFR 30-59 ml/min - Stable     CAD (coronary artery disease) - Stable no chest pain reported     Hyponatremia - Suspect improvement with improved oral intake.    Elevated bilirubin    Dehydration - We'll continue to improve once patient's oral intake improves once he has cholecystectomy.   Code Status:  full  Family Communication: d/c patient Disposition Plan: pending Gen surg recs, possibly next am.   Consultants:  Gen surgery  Procedures:  Pending  Antibiotics:  Rocephin and metronidazole  HPI/Subjective: Pt has no new complaints. No acute issues reported overnight.   Objective: Filed Vitals:   06/16/15 1003 06/16/15 1027  BP: 120/70 126/70  Pulse: 95 92  Temp: 98.6 F (37 C) 98.8 F (37.1 C)  Resp: 15 16    Intake/Output Summary (Last 24 hours) at 06/16/15 1415 Last data filed at 06/16/15 1400  Gross per 24 hour  Intake   1130 ml  Output   1000 ml  Net    130 ml   Filed Weights   06/13/15 1558  Weight: 99.338 kg (219 lb)    Exam:   General:  Pt in nad, alert and awake  Cardiovascular: no cyanosis  Respiratory: no increased wob, no wheezes  Abdomen: no guarding, incisions intact  Musculoskeletal: no cyanosis   Data Reviewed: Basic Metabolic Panel:  Recent Labs Lab 06/12/15 1038 06/14/15 0430 06/15/15 0445 06/16/15 1020   NA 131* 137 138 130*  K 4.2 4.2 4.4 4.0  CL 94* 103 104 102  CO2 20*  GLUCOSE 88 97 95 151*  BUN 43* 54* 36* 22*  CREATININE 2.67* 2.71* 1.73* 1.55*  CALCIUM 9.5 8.6* 8.4* 8.5*   Liver Function Tests:  Recent Labs Lab 06/12/15 1038 06/14/15 0430 06/15/15 0445  AST 20 31 95*  ALT 44 39 67*  ALKPHOS 55 64 81  BILITOT 1.8* 1.0 1.1  PROT 7.4 6.3* 5.9*  ALBUMIN 3.9 3.2* 2.8*    Recent Labs Lab 06/13/15 1908 06/14/15 0430  LIPASE 30 54*   No results for input(s): AMMONIA in the last 168 hours. CBC:  Recent Labs Lab 06/13/15 1020 06/14/15 0430 06/15/15 0445  WBC 15.4* 8.2 8.5  NEUTROABS 12.2*  --   --   HGB 16.3 13.4 13.4  HCT 48.9 40.7 40.7  MCV 93.5 92.7 92.5  PLT PLATELET CLUMPS NOTED ON SMEAR, UNABLE TO ESTIMATE 211 212   Cardiac Enzymes: No results for input(s): CKTOTAL, CKMB, CKMBINDEX, TROPONINI in the last 168 hours. BNP (last 3 results) No results for input(s): BNP in the last 8760 hours.  ProBNP (last 3 results) No results for input(s): PROBNP in the last 8760 hours.  CBG: No results for input(s): GLUCAP in the last 168 hours.  Recent Results (from the past 240 hour(s))  Surgical pcr screen     Status: None   Collection Time: 06/13/15 11:14 PM  Result  Value Ref Range Status   MRSA, PCR NEGATIVE NEGATIVE Final   Staphylococcus aureus NEGATIVE NEGATIVE Final    Comment:        The Xpert SA Assay (FDA approved for NASAL specimens in patients over 73 years of age), is one component of a comprehensive surveillance program.  Test performance has been validated by [facility] for patients greater than or equal to 259 year old. It is not intended to diagnose infection nor to guide or monitor treatment.      Studies: Dg Cholangiogram Operative  06/14/2015  CLINICAL DATA:  Right upper quadrant pain.  Cholecystectomy. EXAM: INTRAOPERATIVE CHOLANGIOGRAM TECHNIQUE: Cholangiographic images from the C-arm fluoroscopic device were submitted  for interpretation post-operatively. Please see the procedural report for the amount of contrast and the fluoroscopy time utilized. COMPARISON:  Ultrasound abdomen 06/12/2015 FINDINGS: Intraoperative fluoroscopy is obtained with during injection of contrast material into the bowel ducts. Fluoroscopy time is recorded at 16 seconds. A single spot fluoroscopic images obtained. Spot fluoroscopic image obtained of the right upper quadrant during injection of contrast material into the cystic duct. Surgical clips across the cystic duct. Intrahepatic ducts are not well visualized. No significant extrahepatic bile duct dilatation. No intraluminal filling defects demonstrated. Free flow of contrast material into the duodenum suggesting no evidence of obstruction. No stricture identified. Somewhat a amorphous density projected medial to the bowel ducts could represent extraluminal contrast material. IMPRESSION: No bile duct dilatation or common bile duct stones identified. Electronically Signed   By: Burman NievesWilliam  Stevens M.D.   On: 06/14/2015 18:19    Scheduled Meds: . aspirin EC  325 mg Oral Daily  . cefTRIAXone (ROCEPHIN)  IV  2 g Intravenous Q24H  . isosorbide mononitrate  30 mg Oral Daily  . lip balm  1 application Topical BID  . metoprolol tartrate  12.5 mg Oral BID  . metroNIDAZOLE  500 mg Oral 3 times per day  . mometasone-formoterol  2 puff Inhalation BID  . saccharomyces boulardii  250 mg Oral BID  . sodium chloride  3 mL Intravenous Q12H   Continuous Infusions: . sodium chloride 100 mL/hr (06/15/15 2220)    Time spent: > 35 minutes   [name], [name]  [facility] Pager [phone]. If 7PM-7AM, please contact night-coverage at [email or web address], password [email or web address] 06/16/2015, 2:15 PM  LOS: 3 days

## 2015-06-16 NOTE — Care Management Note (Signed)
Case Management Note  Patient Details  Name: Preston BusmanCharles O Boyd MRN: [id number] Date of Birth: [birth year]  Subjective/Objective:   S/p lap chole with early empyema                 Action/Plan: Discharge planning, spoke with patient at bedside. States he lives at home alone, sister is disabled and her spouse is ill as well. States he could go and stay with them post d/c but describes when he does this, he takes care of them. Encouraged patient to seek assistance from friends as he will need assistance with meals and transportation. Patient states he has a friend that is able to check on him and assist some. Patient is ambulatory and denies need for any DME.  Expected Discharge Date:   (unknown)               Expected Discharge Plan:  Home/Self Care  In-House Referral:  NA  Discharge planning Services  CM Consult  Post Acute Care Choice:  NA Choice offered to:  NA  DME Arranged:  N/A DME Agency:  NA  HH Arranged:  NA HH Agency:  NA  Status of Service:  Completed, signed off  Medicare Important Message Given:    Date Medicare IM Given:    Medicare IM give by:    Date Additional Medicare IM Given:    Additional Medicare Important Message give by:     If discussed at Long Length of Stay Meetings, dates discussed:    Additional Comments:  Alexis Goodelleele, Bethzy Hauck K, RN 06/16/2015, 10:31 AM

## 2015-06-16 NOTE — Progress Notes (Signed)
General Surgery Note  LOS: 3 days  POD -  2 Days Post-Op  Assessment/Plan: 1.  LAPAROSCOPIC CHOLECYSTECTOMY WITH INTRAOPERATIVE CHOLANGIOGRAM - 06/14/2015 - Gross  Early empyema of the gall bladder - will probably need antibiotics a total of 7 days  On Flagyl/Rocephin  Will advance to reg diet  2.  Urinary retention  Would probably try foley out again.  He does not have a urologist. 3.  COPD 4.  CKD  Creat - 1.73 - 06/15/2015 5.  CAD 6.  DVT prophylaxis - no chemoprophylaxis   Principal Problem:   Acute cholecystitis s/p lap cholecystectomy 06/14/2015 Active Problems:   Hypertension   Nodular hyperplasia of prostate gland   COPD (chronic obstructive pulmonary disease) (HCC)   CKD (chronic kidney disease) stage 3, GFR 30-59 ml/min   Hyperlipidemia   CAD (coronary artery disease)   Hyponatremia   Elevated bilirubin   AKI (acute kidney injury) (HCC)   Dehydration   Subjective:  Does not hurt too much.  Is not ambulating much.  Dr. Felicity CoyerLeschber is his PCP.   Objective:   Filed Vitals:   06/15/15 2216 06/16/15 0500  BP: 127/88   Pulse: 89 78  Temp: 99.1 F (37.3 C) 99.2 F (37.3 C)  Resp: 18 18     Intake/Output from previous day:  11/24 0701 - 11/25 0700 In: 4210 [P.O.:600; I.V.:3560; IV Piggyback:50] Out: 1600 [Urine:1600]  Intake/Output this shift:      Physical Exam:   General: WN older WM who is alert and oriented.    HEENT: Normal. Pupils equal. .   Lungs: Clear.  IS - 1,800 cc.   Abdomen: Soft   Wound: Clean   Still has foley     Lab Results:    Recent Labs  06/14/15 0430 06/15/15 0445  WBC 8.2 8.5  HGB 13.4 13.4  HCT 40.7 40.7  PLT 211 212    BMET   Recent Labs  06/14/15 0430 06/15/15 0445  NA 137 138  K 4.2 4.4  CL 103 104  CO2 25 26  GLUCOSE 97 95  BUN 54* 36*  CREATININE 2.71* 1.73*  CALCIUM 8.6* 8.4*    PT/INR   Recent Labs  06/14/15 0430  LABPROT 15.7*  INR 1.24    ABG  No results for input(s): PHART, HCO3 in the  last 72 hours.  Invalid input(s): PCO2, PO2   Studies/Results:  Dg Cholangiogram Operative  06/14/2015  CLINICAL DATA:  Right upper quadrant pain.  Cholecystectomy. EXAM: INTRAOPERATIVE CHOLANGIOGRAM TECHNIQUE: Cholangiographic images from the C-arm fluoroscopic device were submitted for interpretation post-operatively. Please see the procedural report for the amount of contrast and the fluoroscopy time utilized. COMPARISON:  Ultrasound abdomen 06/12/2015 FINDINGS: Intraoperative fluoroscopy is obtained with during injection of contrast material into the bowel ducts. Fluoroscopy time is recorded at 16 seconds. A single spot fluoroscopic images obtained. Spot fluoroscopic image obtained of the right upper quadrant during injection of contrast material into the cystic duct. Surgical clips across the cystic duct. Intrahepatic ducts are not well visualized. No significant extrahepatic bile duct dilatation. No intraluminal filling defects demonstrated. Free flow of contrast material into the duodenum suggesting no evidence of obstruction. No stricture identified. Somewhat a amorphous density projected medial to the bowel ducts could represent extraluminal contrast material. IMPRESSION: No bile duct dilatation or common bile duct stones identified. Electronically Signed   By: Burman NievesWilliam  Stevens M.D.   On: 06/14/2015 18:19     Anti-infectives:   Anti-infectives  Start     Dose/Rate Route Frequency Ordered Stop   06/15/15 1800  cefTRIAXone (ROCEPHIN) 2 g in dextrose 5 % 50 mL IVPB    Comments:  Pharmacy may adjust dosing strength / duration / interval for maximal efficacy   2 g 100 mL/hr over 30 Minutes Intravenous Every 24 hours 06/14/15 1936 06/17/15 1759   06/13/15 1700  cefTRIAXone (ROCEPHIN) 2 g in dextrose 5 % 50 mL IVPB  Status:  Discontinued    Comments:  Pharmacy may adjust dosing strength / duration / interval for maximal efficacy   2 g 100 mL/hr over 30 Minutes Intravenous Every 24 hours  06/13/15 1639 06/14/15 1936   06/13/15 1615  ciprofloxacin (CIPRO) IVPB 400 mg  Status:  Discontinued     400 mg 200 mL/hr over 60 Minutes Intravenous Every 12 hours 06/13/15 1608 06/13/15 1639   06/13/15 1615  metroNIDAZOLE (FLAGYL) tablet 500 mg     500 mg Oral 3 times per day 06/13/15 1608        Ovidio Kin, MD, FACS Pager: [phone] [facility] Office: [phone] 06/16/2015

## 2015-06-17 MED ORDER — AMOXICILLIN-POT CLAVULANATE 875-125 MG PO TABS: 1.0000 | ORAL_TABLET | Freq: Two times a day (BID) | ORAL | Status: DC

## 2015-06-17 MED ORDER — METRONIDAZOLE 500 MG PO TABS: 500.0000 mg | ORAL_TABLET | Freq: Three times a day (TID) | ORAL | Status: DC

## 2015-06-17 NOTE — Discharge Summary (Signed)
Physician Discharge Summary  Preston Boyd:[id number] DOB: 1941-06-05 DOA: 06/13/2015  PCP: Rene Paci, MD  Admit date: 06/13/2015 Discharge date: 06/17/2015  Time spent: > 35 minutes  Recommendations for Outpatient Follow-up:  1. Monitor serum creatinine 2. Monitor Sodium levels   Discharge Diagnoses:  Principal Problem:   Acute cholecystitis s/p lap cholecystectomy 06/14/2015 Active Problems:   Hypertension   Nodular hyperplasia of prostate gland   COPD (chronic obstructive pulmonary disease) (HCC)   CKD (chronic kidney disease) stage 3, GFR 30-59 ml/min   Hyperlipidemia   CAD (coronary artery disease)   Hyponatremia   Elevated bilirubin   AKI (acute kidney injury) (HCC)   Dehydration   Discharge Condition: stable  Diet recommendation: low fat diet  Filed Weights   06/13/15 1558  Weight: 99.338 kg (219 lb)    History of present illness:  From original HPI: 74 y.o. male with a PMH of non-obstructive CAD s/p heart catheterization 02/15/15 showing 10% proximal LAD lesion, 40% RPDA lesion with normal EF and no regional wall motion abnormalities, HTN, COPD and stage III CKD who was referred to [facility] as a direct admission by his PCP for treatment of cholecystitis in the setting of cholelithiasis and a 4 day history of RUQ abdominal pain  Hospital Course:  Acute cholecystitis - some evidence of early empyema during operation. As such oral antibiotics have been recommended. Will discharge on 5 more days of Augmentin and metronidazole. - Patient to follow-up with surgeon after discharge  Procedures:  Laparoscopic cholecystectomy  Consultations:  General surgery: Last seen by Dr. Ezzard Standing  Discharge Exam: Filed Vitals:   06/17/15 1100 06/17/15 1339  BP: 120/58 122/70  Pulse: 68 76  Temp:  98.8 F (37.1 C)  Resp:  16    General: Patient in no acute distress, alert and awake Cardiovascular: No cyanosis Respiratory: No increased work of breathing, no  wheezes, equal chest rise  Discharge Instructions   Discharge Instructions    Call MD for:  extreme fatigue    Complete by:  As directed      Call MD for:  hives    Complete by:  As directed      Call MD for:  persistant nausea and vomiting    Complete by:  As directed      Call MD for:  persistant nausea and vomiting    Complete by:  As directed      Call MD for:  redness, tenderness, or signs of infection (pain, swelling, redness, odor or green/yellow discharge around incision site)    Complete by:  As directed      Call MD for:  redness, tenderness, or signs of infection (pain, swelling, redness, odor or green/yellow discharge around incision site)    Complete by:  As directed      Call MD for:  severe uncontrolled pain    Complete by:  As directed      Call MD for:  severe uncontrolled pain    Complete by:  As directed      Call MD for:  temperature >100.4    Complete by:  As directed      Call MD for:    Complete by:  As directed   Temperature > 101.66F     Diet - low sodium heart healthy    Complete by:  As directed      Diet - low sodium heart healthy    Complete by:  As directed  Discharge instructions    Complete by:  As directed   Please see discharge instruction sheets.  Also refer to handout given an office.  Please call our office if you have any questions or concerns [phone]     Discharge instructions    Complete by:  As directed   Please follow-up with your general surgeon as per their recommendations based on the conversation with you while in the hospital. Please call their office to ensure follow-up appointment date and time.     Discharge wound care:    Complete by:  As directed   If you have closed incisions, shower and bathe over these incisions with soap and water every day.  Remove all surgical dressings on postoperative day #3.  You do not need to replace dressings over the closed incisions unless you feel more comfortable with a Band-Aid  covering it.   If you have an open wound that requires packing, please see wound care instructions.  In general, remove all dressings, wash wound with soap and water and then replace with saline moistened gauze.  Do the dressing change at least every day.  Please call our office [phone] if you have further questions.     Driving Restrictions    Complete by:  As directed   No driving until off narcotics and can safely swerve away without pain during an emergency     Increase activity slowly    Complete by:  As directed   Walk an hour a day.  Use 20-30 minute walks.  When you can walk 30 minutes without difficulty, it is fine to restart low impact/moderate activities such as biking, jogging, swimming, sexual activity, etc.  Eventually you can increase to unrestricted activity when not feeling pain.  If you feel pain: STOP!Marland Kitchen   Let pain protect you from overdoing it.  Use ice/heat & over-the-counter pain medications to help minimize soreness.  If that is not enough, then use your narcotic pain prescription as needed to remain active.  It is better to take extra pain medications and be more active than to stay bedridden to avoid all pain medications.     Increase activity slowly    Complete by:  As directed      Lifting restrictions    Complete by:  As directed   Avoid heavy lifting initially.  Do not push through pain.  You have no specific weight limit - if it hurts to do, DON'T DO IT.   If you feel no pain, you are not injuring anything.  Pain will protect you from injury.  Coughing and sneezing are far more stressful to your incision than any lifting.  Avoid resuming heavy lifting / intense activity until off all narcotic pain medications.  When ready to exercise more, give yourself 2 weeks to gradually get back to full intense exercise/activity.     May shower / Bathe    Complete by:  As directed      May walk up steps    Complete by:  As directed      Sexual Activity Restrictions    Complete  by:  As directed   Sexual activity as tolerated.  Do not push through pain.  Pain will protect you from injury.     Walk with assistance    Complete by:  As directed   Walk over an hour a day.  May use a walker/cane/companion to help with balance and stamina.  Current Discharge Medication List    START taking these medications   Details  amoxicillin-clavulanate (AUGMENTIN) 875-125 MG tablet Take 1 tablet by mouth 2 (two) times daily. Qty: 10 tablet, Refills: 0    metroNIDAZOLE (FLAGYL) 500 MG tablet Take 1 tablet (500 mg total) by mouth every 8 (eight) hours. Qty: 15 tablet, Refills: 0      CONTINUE these medications which have CHANGED   Details  HYDROcodone-acetaminophen (NORCO/VICODIN) 5-325 MG tablet Take 1-2 tablets by mouth every 4 (four) hours as needed for moderate pain. Qty: 40 tablet, Refills: 0      CONTINUE these medications which have NOT CHANGED   Details  acetaminophen (TYLENOL) 500 MG tablet Take 500 mg by mouth every 6 (six) hours as needed for moderate pain.     aspirin EC 325 MG tablet Take 1 tablet (325 mg total) by mouth daily. Qty: 100 tablet, Refills: 3    Flaxseed, Linseed, (FLAX SEED OIL PO) Take 15 mLs by mouth daily.    Fluticasone-Salmeterol (ADVAIR DISKUS) 250-50 MCG/DOSE AEPB Inhale 1 puff into the lungs 2 (two) times daily as needed (for shortness of breath). Qty: 60 each, Refills: 3    GuaiFENesin (MUCINEX PO) Take 1 tablet by mouth as needed (CONGESTION).     isosorbide mononitrate (IMDUR) 30 MG 24 hr tablet Take 1 tablet (30 mg total) by mouth daily. Qty: 30 tablet, Refills: 6    metoprolol tartrate (LOPRESSOR) 25 MG tablet Take 0.5 tablets (12.5 mg total) by mouth 2 (two) times daily. Qty: 30 tablet, Refills: 6    Olopatadine HCl (PAZEO) 0.7 % SOLN Place 1 drop into both eyes daily as needed (for dry eyes).    ondansetron (ZOFRAN) 4 MG tablet Take 4 mg by mouth every 8 (eight) hours as needed for nausea or vomiting.     Propylene Glycol (SYSTANE BALANCE OP) Place 1 drop into both eyes as needed (for dry eyes).      STOP taking these medications     ciprofloxacin (CIPRO) 500 MG tablet      ibuprofen (ADVIL,MOTRIN) 200 MG tablet        Allergies  Allergen Reactions  . Other Other (See Comments)    Amoxicillin > body aches all over per patient      The results of significant diagnostics from this hospitalization (including imaging, microbiology, ancillary and laboratory) are listed below for reference.    Significant Diagnostic Studies: Dg Cholangiogram Operative  06/14/2015  CLINICAL DATA:  Right upper quadrant pain.  Cholecystectomy. EXAM: INTRAOPERATIVE CHOLANGIOGRAM TECHNIQUE: Cholangiographic images from the C-arm fluoroscopic device were submitted for interpretation post-operatively. Please see the procedural report for the amount of contrast and the fluoroscopy time utilized. COMPARISON:  Ultrasound abdomen 06/12/2015 FINDINGS: Intraoperative fluoroscopy is obtained with during injection of contrast material into the bowel ducts. Fluoroscopy time is recorded at 16 seconds. A single spot fluoroscopic images obtained. Spot fluoroscopic image obtained of the right upper quadrant during injection of contrast material into the cystic duct. Surgical clips across the cystic duct. Intrahepatic ducts are not well visualized. No significant extrahepatic bile duct dilatation. No intraluminal filling defects demonstrated. Free flow of contrast material into the duodenum suggesting no evidence of obstruction. No stricture identified. Somewhat a amorphous density projected medial to the bowel ducts could represent extraluminal contrast material. IMPRESSION: No bile duct dilatation or common bile duct stones identified. Electronically Signed   By: Burman NievesWilliam  Stevens M.D.   On: 06/14/2015 18:19   [place] Abdomen Complete  06/12/2015  CLINICAL DATA:  Right upper quadrant pain EXAM: ULTRASOUND ABDOMEN COMPLETE COMPARISON:   None. FINDINGS: Gallbladder: There are 2 gallstones towards the neck of the gallbladder. The larger measures 2.3 cm. Gallbladder wall is thickened at 6 mm. Sonographic Murphy sign is positive. Common bile duct: Diameter: 9 mm Liver: Diffusely increased in echogenicity without focal mass. No intrahepatic biliary dilatation. IVC: No abnormality visualized. Pancreas: No pancreatic head mass. The pancreatic duct is upper normal in caliber at 3.7 mm. Spleen: Size and appearance within normal limits. Right Kidney: Length: 11.2 cm. Echogenicity within normal limits. No mass or hydronephrosis visualized. Left Kidney: Length: 11.2 cm. Echogenicity within normal limits. No mass or hydronephrosis visualized. Abdominal aorta: No aneurysm visualized. Other findings: None. IMPRESSION: Cholelithiasis There is wall thickening in a positive sonographic Murphy sign suggesting acute cholecystitis. Correlate clinically as for the need for nuclear medicine imaging. The common bile duct is dilated. Developing biliary obstruction is not excluded. Correlate clinically as for the need for MRCP or ERCP. Diffuse hepatic steatosis. Electronically Signed   By: Jolaine Click M.D.   On: 06/12/2015 15:01   Dg Chest Port 1 View  06/13/2015  CLINICAL DATA:  Shortness of breath with cough EXAM: PORTABLE CHEST 1 VIEW COMPARISON:  12/01/2014 FINDINGS: Slightly lower lung volumes with mild elevation of the right hemidiaphragm. Normal heart size and vascularity. No acute pneumonia, collapse or consolidation. Negative for edema, effusion or pneumothorax. Trachea midline. No acute osseous finding. IMPRESSION: Lower lung volumes but no acute process. Electronically Signed   By: Judie Petit.  Shick M.D.   On: 06/13/2015 17:11    Microbiology: Recent Results (from the past 240 hour(s))  Surgical pcr screen     Status: None   Collection Time: 06/13/15 11:14 PM  Result Value Ref Range Status   MRSA, PCR NEGATIVE NEGATIVE Final   Staphylococcus aureus NEGATIVE  NEGATIVE Final    Comment:        The Xpert SA Assay (FDA approved for NASAL specimens in patients over 74 years of age), is one component of a comprehensive surveillance program.  Test performance has been validated by [facility] for patients greater than or equal to 55 year old. It is not intended to diagnose infection nor to guide or monitor treatment.      Labs: Basic Metabolic Panel:  Recent Labs Lab 06/12/15 1038 06/14/15 0430 06/15/15 0445 06/16/15 1020  NA 131* 137 138 130*  K 4.2 4.2 4.4 4.0  CL 94* 103 104 102  CO2 20*  GLUCOSE 88 97 95 151*  BUN 43* 54* 36* 22*  CREATININE 2.67* 2.71* 1.73* 1.55*  CALCIUM 9.5 8.6* 8.4* 8.5*   Liver Function Tests:  Recent Labs Lab 06/12/15 1038 06/14/15 0430 06/15/15 0445  AST 20 31 95*  ALT 44 39 67*  ALKPHOS 55 64 81  BILITOT 1.8* 1.0 1.1  PROT 7.4 6.3* 5.9*  ALBUMIN 3.9 3.2* 2.8*    Recent Labs Lab 06/13/15 1908 06/14/15 0430  LIPASE 30 54*   No results for input(s): AMMONIA in the last 168 hours. CBC:  Recent Labs Lab 06/13/15 1020 06/14/15 0430 06/15/15 0445  WBC 15.4* 8.2 8.5  NEUTROABS 12.2*  --   --   HGB 16.3 13.4 13.4  HCT 48.9 40.7 40.7  MCV 93.5 92.7 92.5  PLT PLATELET CLUMPS NOTED ON SMEAR, UNABLE TO ESTIMATE 211 212   Cardiac Enzymes: No results for input(s): CKTOTAL, CKMB, CKMBINDEX, TROPONINI in the last 168 hours. BNP: BNP (last  3 results) No results for input(s): BNP in the last 8760 hours.  ProBNP (last 3 results) No results for input(s): PROBNP in the last 8760 hours.  CBG: No results for input(s): GLUCAP in the last 168 hours.     Signed:  Penny Pia  [facility] 06/17/2015, 2:10 PM

## 2015-06-17 NOTE — Progress Notes (Signed)
Assessment unchanged. Pt verbalized understanding of dc instructions through teach back regarding follow up care with surgeon and when to call the doctor. Script x 3 given to pt as provided by MD. No complaints voiced. Discharged via wc to front entrance to meet awaiting vehicle to carry home. Accompanied by NT and nephew.

## 2015-06-17 NOTE — Progress Notes (Signed)
General Surgery Note  LOS: 4 days  POD -  3 Days Post-Op  Assessment/Plan: 1.  LAPAROSCOPIC CHOLECYSTECTOMY WITH INTRAOPERATIVE CHOLANGIOGRAM - 06/14/2015 - Gross  Early empyema of the gall bladder - will probably need antibiotics a total of 7 days from surgery  On Flagyl/Rocephin  Will advance to reg diet  2.  Urinary retention  Would probably try foley out again.  He does not have a urologist.  Will remove foley today. 3.  COPD 4.  CKD  Creat - 1.55 - 06/16/2015 5.  CAD 6.  DVT prophylaxis - no chemoprophylaxis   Principal Problem:   Acute cholecystitis s/p lap cholecystectomy 06/14/2015 Active Problems:   Hypertension   Nodular hyperplasia of prostate gland   COPD (chronic obstructive pulmonary disease) (HCC)   CKD (chronic kidney disease) stage 3, GFR 30-59 ml/min   Hyperlipidemia   CAD (coronary artery disease)   Hyponatremia   Elevated bilirubin   AKI (acute kidney injury) (HCC)   Dehydration   Subjective:  Walked in hall yesterday.  Doing better overall.  Dr. Felicity CoyerLeschber is his PCP.   Objective:   Filed Vitals:   06/16/15 2046 06/17/15 0618  BP: 153/87   Pulse: 88   Temp: 99.4 F (37.4 C) 98.6 F (37 C)  Resp: 16 20     Intake/Output from previous day:  11/25 0701 - 11/26 0700 In: 2040 [P.O.:1320; I.V.:720] Out: 2000 [Urine:2000]  Intake/Output this shift:      Physical Exam:   General: WN older WM who is alert and oriented.    HEENT: Normal. Pupils equal. .   Lungs: Clear.     Abdomen: Soft   Wound: Clean   Still has foley     Lab Results:     Recent Labs  06/15/15 0445  WBC 8.5  HGB 13.4  HCT 40.7  PLT 212    BMET    Recent Labs  06/15/15 0445 06/16/15 1020  NA 138 130*  K 4.4 4.0  CL 104 102  CO2 26 20*  GLUCOSE 95 151*  BUN 36* 22*  CREATININE 1.73* 1.55*  CALCIUM 8.4* 8.5*    PT/INR  No results for input(s): LABPROT, INR in the last 72 hours.  ABG  No results for input(s): PHART, HCO3 in the last 72  hours.  Invalid input(s): PCO2, PO2   Studies/Results:  No results found.   Anti-infectives:   Anti-infectives    Start     Dose/Rate Route Frequency Ordered Stop   06/15/15 1800  cefTRIAXone (ROCEPHIN) 2 g in dextrose 5 % 50 mL IVPB    Comments:  Pharmacy may adjust dosing strength / duration / interval for maximal efficacy   2 g 100 mL/hr over 30 Minutes Intravenous Every 24 hours 06/14/15 1936 06/16/15 1813   06/13/15 1700  cefTRIAXone (ROCEPHIN) 2 g in dextrose 5 % 50 mL IVPB  Status:  Discontinued    Comments:  Pharmacy may adjust dosing strength / duration / interval for maximal efficacy   2 g 100 mL/hr over 30 Minutes Intravenous Every 24 hours 06/13/15 1639 06/14/15 1936   06/13/15 1615  ciprofloxacin (CIPRO) IVPB 400 mg  Status:  Discontinued     400 mg 200 mL/hr over 60 Minutes Intravenous Every 12 hours 06/13/15 1608 06/13/15 1639   06/13/15 1615  metroNIDAZOLE (FLAGYL) tablet 500 mg     500 mg Oral 3 times per day 06/13/15 1608        Ovidio Kinavid Loren Vicens, MD, FACS Pager:  [phone] [facility] Office: [phone] 06/17/2015

## 2015-06-17 NOTE — Progress Notes (Signed)
Dr. Cena BentonVega made aware pt voiding adequately since foley out this am. Pt had small amount of bleeding when foley removed. Urine has cherry color yet is clearer with each void. Pt denies pain of discomfort.

## 2015-06-18 ENCOUNTER — Encounter (HOSPITAL_COMMUNITY): Payer: Self-pay | Admitting: Emergency Medicine

## 2015-06-18 ENCOUNTER — Emergency Department (HOSPITAL_COMMUNITY)
Admission: EM | Admit: 2015-06-18 | Discharge: 2015-06-18 | Disposition: A | Discharge status: Home / Self Care | Payer: Medicare HMO | Source: Home / Self Care | Attending: Emergency Medicine | Admitting: Emergency Medicine

## 2015-06-18 ENCOUNTER — Emergency Department (HOSPITAL_COMMUNITY)
Admission: EM | Admit: 2015-06-18 | Discharge: 2015-06-18 | Disposition: A | Discharge status: Home / Self Care | Payer: Medicare HMO | Attending: Emergency Medicine | Admitting: Emergency Medicine

## 2015-06-18 DIAGNOSIS — I2510 Atherosclerotic heart disease of native coronary artery without angina pectoris: Secondary | ICD-10-CM | POA: Insufficient documentation

## 2015-06-18 DIAGNOSIS — R339 Retention of urine, unspecified: Secondary | ICD-10-CM | POA: Insufficient documentation

## 2015-06-18 DIAGNOSIS — Z8719 Personal history of other diseases of the digestive system: Secondary | ICD-10-CM | POA: Insufficient documentation

## 2015-06-18 DIAGNOSIS — M159 Polyosteoarthritis, unspecified: Secondary | ICD-10-CM | POA: Insufficient documentation

## 2015-06-18 DIAGNOSIS — Z9889 Other specified postprocedural states: Secondary | ICD-10-CM

## 2015-06-18 DIAGNOSIS — N183 Chronic kidney disease, stage 3 (moderate): Secondary | ICD-10-CM | POA: Insufficient documentation

## 2015-06-18 DIAGNOSIS — T83038A Leakage of other indwelling urethral catheter, initial encounter: Secondary | ICD-10-CM | POA: Insufficient documentation

## 2015-06-18 DIAGNOSIS — Z792 Long term (current) use of antibiotics: Secondary | ICD-10-CM

## 2015-06-18 DIAGNOSIS — Z87891 Personal history of nicotine dependence: Secondary | ICD-10-CM | POA: Insufficient documentation

## 2015-06-18 DIAGNOSIS — Z880 Allergy status to penicillin: Secondary | ICD-10-CM | POA: Insufficient documentation

## 2015-06-18 DIAGNOSIS — J449 Chronic obstructive pulmonary disease, unspecified: Secondary | ICD-10-CM

## 2015-06-18 DIAGNOSIS — Z79899 Other long term (current) drug therapy: Secondary | ICD-10-CM | POA: Insufficient documentation

## 2015-06-18 DIAGNOSIS — Z87448 Personal history of other diseases of urinary system: Secondary | ICD-10-CM | POA: Insufficient documentation

## 2015-06-18 DIAGNOSIS — I129 Hypertensive chronic kidney disease with stage 1 through stage 4 chronic kidney disease, or unspecified chronic kidney disease: Secondary | ICD-10-CM

## 2015-06-18 DIAGNOSIS — Z87438 Personal history of other diseases of male genital organs: Secondary | ICD-10-CM | POA: Insufficient documentation

## 2015-06-18 DIAGNOSIS — Z7982 Long term (current) use of aspirin: Secondary | ICD-10-CM | POA: Insufficient documentation

## 2015-06-18 DIAGNOSIS — Y802 Prosthetic and other implants, materials and accessory physical medicine devices associated with adverse incidents: Secondary | ICD-10-CM

## 2015-06-18 DIAGNOSIS — R338 Other retention of urine: Secondary | ICD-10-CM

## 2015-06-18 LAB — URINALYSIS, ROUTINE W REFLEX MICROSCOPIC
Bilirubin Urine: NEGATIVE
Glucose, UA: NEGATIVE mg/dL
Ketones, ur: NEGATIVE mg/dL
LEUKOCYTES UA: NEGATIVE
NITRITE: NEGATIVE
PROTEIN: 30 mg/dL — AB
Specific Gravity, Urine: 1.021 (ref 1.005–1.030)
pH: 5.5 (ref 5.0–8.0)

## 2015-06-18 LAB — URINE MICROSCOPIC-ADD ON
BACTERIA UA: NONE SEEN
SQUAMOUS EPITHELIAL / LPF: NONE SEEN

## 2015-06-18 NOTE — ED Provider Notes (Signed)
CSN: [id number]     Arrival date & time 06/18/15  1655 History   First MD Initiated Contact with Patient 06/18/15 2031     Chief Complaint  Patient presents with  . Catheter Leaking      (Consider location/radiation/quality/duration/timing/severity/associated sxs/prior Treatment) HPI 74 y.o. male presents with drainage of urine around foley catheter.  No other medical complaints.  No fevers, blood, or other complaint.  Timing constant, duration 2 hours.   Past Medical History  Diagnosis Date  . DEGENERATIVE JOINT DISEASE, RIGHT HIP     s/p THR 11/2008  . Osteoarthritis of knee     s/p B TKR 07/2009  . GERD   . Hypertension   . RENAL INSUFFICIENCY   . BENIGN PROSTATIC HYPERTROPHY   . MIGRAINE HEADACHE   . COPD (chronic obstructive pulmonary disease) (HCC)   . CKD (chronic kidney disease) stage 3, GFR 30-59 ml/min   . History of cardiovascular stress test 01/2015    Lexiscan Myoview 7/16:  Inferoseptal, inferior and apical ischemia, EF 67%, TID 1.09; intermediate risk  . CAD (coronary artery disease) 01/2015    mild on cath 01/2015, med mgmt rec   Past Surgical History  Procedure Laterality Date  . Tonsillectomy    . Total hip arthroplasty  11/2008    Dr. Luiz BlareGraves  . Replacement total knee bilateral  07/2009    graves  . Cataract extraction, bilateral  10/2012  . Cardiac catheterization N/A 02/15/2015    Procedure: Left Heart Cath and Coronary Angiography;  Surgeon: Lennette Biharihomas A Kelly, MD;  Location: [facility] INVASIVE CV LAB;  Service: Cardiovascular;  Laterality: N/A;  . Inguinal hernia repair Bilateral 1960's  . Cholecystectomy N/A 06/14/2015    Procedure: LAPAROSCOPIC CHOLECYSTECTOMY WITH INTRAOPERATIVE CHOLANGIOGRAM;  Surgeon: Karie SodaSteven Gross, MD;  Location: [facility] ORS;  Service: General;  Laterality: N/A;   Family History  Problem Relation Age of Onset  . Colon cancer Mother   . Lung cancer Father   . Alcohol abuse Other   . Arthritis Other   . Diabetes Other   . Hypertension Other   .  Heart disease Other   . Heart attack Maternal Uncle   . Stroke Paternal Grandfather    Social History  Substance Use Topics  . Smoking status: Former Smoker    Quit date: 07/22/1962  . Smokeless tobacco: Never Used  . Alcohol Use: 0.0 oz/week    0 Standard drinks or equivalent per week     Comment: ocassionally    Review of Systems  All other systems reviewed and are negative.     Allergies  Amoxicillin and Other  Home Medications   Prior to Admission medications   Medication Sig Start Date End Date Taking? Authorizing Provider  acetaminophen (TYLENOL) 500 MG tablet Take 500 mg by mouth every 6 (six) hours as needed for moderate pain.     Historical Provider, MD  amoxicillin-clavulanate (AUGMENTIN) 875-125 MG tablet Take 1 tablet by mouth 2 (two) times daily. 06/17/15   Penny Piarlando Vega, MD  aspirin EC 325 MG tablet Take 1 tablet (325 mg total) by mouth daily. 06/12/15   Newt LukesValerie A Leschber, MD  Flaxseed, Linseed, (FLAX SEED OIL PO) Take 15 mLs by mouth daily.    Historical Provider, MD  Fluticasone-Salmeterol (ADVAIR DISKUS) 250-50 MCG/DOSE AEPB Inhale 1 puff into the lungs 2 (two) times daily as needed (for shortness of breath). 03/07/15   Newt LukesValerie A Leschber, MD  GuaiFENesin (MUCINEX PO) Take 1 tablet by mouth as needed (CONGESTION).  Historical Provider, MD  HYDROcodone-acetaminophen (NORCO/VICODIN) 5-325 MG tablet Take 1-2 tablets by mouth every 4 (four) hours as needed for moderate pain. 06/14/15   Karie Soda, MD  isosorbide mononitrate (IMDUR) 30 MG 24 hr tablet Take 1 tablet (30 mg total) by mouth daily. 06/12/15   Newt Lukes, MD  metoprolol tartrate (LOPRESSOR) 25 MG tablet Take 0.5 tablets (12.5 mg total) by mouth 2 (two) times daily. 06/12/15   Newt Lukes, MD  metroNIDAZOLE (FLAGYL) 500 MG tablet Take 1 tablet (500 mg total) by mouth every 8 (eight) hours. 06/17/15   Penny Pia, MD  Olopatadine HCl (PAZEO) 0.7 % SOLN Place 1 drop into both eyes daily as  needed (for dry eyes).    Historical Provider, MD  ondansetron (ZOFRAN) 4 MG tablet Take 4 mg by mouth every 8 (eight) hours as needed for nausea or vomiting.    Historical Provider, MD  Propylene Glycol (SYSTANE BALANCE OP) Place 1 drop into both eyes as needed (for dry eyes).    Historical Provider, MD   BP 129/74 mmHg  Pulse 80  Temp(Src) 98.6 F (37 C) (Oral)  Resp 16  SpO2 100% Physical Exam  Constitutional: He is oriented to person, place, and time. He appears well-developed and well-nourished.  HENT:  Head: Normocephalic and atraumatic.  Eyes: Conjunctivae and EOM are normal.  Neck: Normal range of motion. Neck supple.  Cardiovascular: Normal rate, regular rhythm and normal heart sounds.   Pulmonary/Chest: Effort normal and breath sounds normal. No respiratory distress.  Abdominal: He exhibits no distension. There is no tenderness. There is no rebound and no guarding.  Musculoskeletal: Normal range of motion.  Neurological: He is alert and oriented to person, place, and time.  Skin: Skin is warm and dry.  Vitals reviewed.   ED Course  Procedures (including critical care time) Labs Review Labs Reviewed - No data to display  Imaging Review No results found. I have personally reviewed and evaluated these images and lab results as part of my medical decision-making.   EKG Interpretation None      MDM   Final diagnoses:  Leakage from urinary catheter, initial encounter [facility])    74 y.o. male with pertinent PMH of recent foley placement for recurrent obstruction presents with leakage of urine around foley.  Otherwise asymptomatic.  Urine in bag on arrival.  No blood.  Flushed without abnormality.  Will have him fu with urology.    I have reviewed all laboratory and imaging studies if ordered as above  1. Leakage from urinary catheter, initial encounter [facility])        Mirian Mo, MD 06/18/15 2056

## 2015-06-18 NOTE — ED Notes (Signed)
Pt had foley cath removed @1400  yesterday, post op chole, pt reports last time he emptied bladder was just after foley removal. Pt now having abd pressure with decreased output

## 2015-06-18 NOTE — ED Notes (Signed)
Pt states that he has a foley w/ leg bag.  States that today, it started leaking around the catheter onto his pants.  Denies pain.

## 2015-06-18 NOTE — Discharge Instructions (Signed)
Foley Catheter Care, Adult °A Foley catheter is a soft, flexible tube that is placed into the bladder to drain urine. A Foley catheter may be inserted if: °· You leak urine or are not able to control when you urinate (urinary incontinence). °· You are not able to urinate when you need to (urinary retention). °· You had prostate surgery or surgery on the genitals. °· You have certain medical conditions, such as multiple sclerosis, dementia, or a spinal cord injury. °If you are going home with a Foley catheter in place, follow the instructions below. °TAKING CARE OF THE CATHETER °1. Wash your hands with soap and water. °2. Using mild soap and warm water on a clean washcloth: °¨ Clean the area on your body closest to the catheter insertion site using a circular motion, moving away from the catheter. Never wipe toward the catheter because this could sweep bacteria up into the urethra and cause infection. °¨ Remove all traces of soap. Pat the area dry with a clean towel. For males, reposition the foreskin. °3. Attach the catheter to your leg so there is no tension on the catheter. Use adhesive tape or a leg strap. If you are using adhesive tape, remove any sticky residue left behind by the previous tape you used. °4. Keep the drainage bag below the level of the bladder, but keep it off the floor. °5. Check throughout the day to be sure the catheter is working and urine is draining freely. Make sure the tubing does not become kinked. °6. Do not pull on the catheter or try to remove it. Pulling could damage internal tissues. °TAKING CARE OF THE DRAINAGE BAGS °You will be given two drainage bags to take home. One is a large overnight drainage bag, and the other is a smaller leg bag that fits underneath clothing. You may wear the overnight bag at any time, but you should never wear the smaller leg bag at night. Follow the instructions below for how to empty, change, and clean your drainage bags. °Emptying the Drainage  Bag °You must empty your drainage bag when it is  -½ full or at least 2-3 times a day. °1. Wash your hands with soap and water. °2. Keep the drainage bag below your hips, below the level of your bladder. This stops urine from going back into the tubing and into your bladder. °3. Hold the dirty bag over the toilet or a clean container. °4. Open the pour spout at the bottom of the bag and empty the urine into the toilet or container. Do not let the pour spout touch the toilet, container, or any other surface. Doing so can place bacteria on the bag, which can cause an infection. °5. Clean the pour spout with a gauze pad or cotton ball that has rubbing alcohol on it. °6. Close the pour spout. °7. Attach the bag to your leg with adhesive tape or a leg strap. °8. Wash your hands well. °Changing the Drainage Bag °Change your drainage bag once a month or sooner if it starts to smell bad or look dirty. Below are steps to follow when changing the drainage bag. °1. Wash your hands with soap and water. °2. Pinch off the rubber catheter so that urine does not spill out. °3. Disconnect the catheter tube from the drainage tube at the connection valve. Do not let the tubes touch any surface. °4. Clean the end of the catheter tube with an alcohol wipe. Use a different alcohol wipe to clean   the end of the drainage tube. °5. Connect the catheter tube to the drainage tube of the clean drainage bag. °6. Attach the new bag to the leg with adhesive tape or a leg strap. Avoid attaching the new bag too tightly. °7. Wash your hands well. °Cleaning the Drainage Bag °1. Wash your hands with soap and water. °2. Wash the bag in warm, soapy water. °3. Rinse the bag thoroughly with warm water. °4. Fill the bag with a solution of white vinegar and water (1 cup vinegar to 1 qt warm water [.2 L vinegar to 1 L warm water]). Close the bag and soak it for 30 minutes in the solution. °5. Rinse the bag with warm water. °6. Hang the bag to dry with the  pour spout open and hanging downward. °7. Store the clean bag (once it is dry) in a clean plastic bag. °8. Wash your hands well. °PREVENTING INFECTION °· Wash your hands before and after handling your catheter. °· Take showers daily and wash the area where the catheter enters your body. Do not take baths. Replace wet leg straps with dry ones, if this applies. °· Do not use powders, sprays, or lotions on the genital area. Only use creams, lotions, or ointments as directed by your caregiver. °· For females, wipe from front to back after each bowel movement. °· Drink enough fluids to keep your urine clear or pale yellow unless you have a fluid restriction. °· Do not let the drainage bag or tubing touch or lie on the floor. °· Wear cotton underwear to absorb moisture and to keep your skin drier. °SEEK MEDICAL CARE IF:  °· Your urine is cloudy or smells unusually bad. °· Your catheter becomes clogged. °· You are not draining urine into the bag or your bladder feels full. °· Your catheter starts to leak. °SEEK IMMEDIATE MEDICAL CARE IF:  °· You have pain, swelling, redness, or pus where the catheter enters the body. °· You have pain in the abdomen, legs, lower back, or bladder. °· You have a fever. °· You see blood fill the catheter, or your urine is pink or red. °· You have nausea, vomiting, or chills. °· Your catheter gets pulled out. °MAKE SURE YOU:  °· Understand these instructions. °· Will watch your condition. °· Will get help right away if you are not doing well or get worse. °  °This information is not intended to replace advice given to you by your health care provider. Make sure you discuss any questions you have with your health care provider. °  °Document Released: 07/08/2005 Document Revised: 11/22/2013 Document Reviewed: 06/29/2012 °Elsevier Interactive Patient Education ©2016 [employer] Inc[employer] ° °

## 2015-06-18 NOTE — Discharge Instructions (Signed)
See the Urologist as advised. Return to the ER if there is any fevers, chills.   Acute Urinary Retention, Male Acute urinary retention is the temporary inability to urinate. This is a common problem in older men. As men age their prostates become larger and block the flow of urine from the bladder. This is usually a problem that has come on gradually.  HOME CARE INSTRUCTIONS If you are sent home with a Foley catheter and a drainage system, you will need to discuss the best course of action with your health care provider. While the catheter is in, maintain a good intake of fluids. Keep the drainage bag emptied and lower than your catheter. This is so that contaminated urine will not flow back into your bladder, which could lead to a urinary tract infection. There are two main types of drainage bags. One is a large bag that usually is used at night. It has a good capacity that will allow you to sleep through the night without having to empty it. The second type is called a leg bag. It has a smaller capacity, so it needs to be emptied more frequently. However, the main advantage is that it can be attached by a leg strap and can go underneath your clothing, allowing you the freedom to move about or leave your home. Only take over-the-counter or prescription medicines for pain, discomfort, or fever as directed by your health care provider.  SEEK MEDICAL CARE IF:  You develop a low-grade fever.  You experience spasms or leakage of urine with the spasms. SEEK IMMEDIATE MEDICAL CARE IF:   You develop chills or fever.  Your catheter stops draining urine.  Your catheter falls out.  You start to develop increased bleeding that does not respond to rest and increased fluid intake. MAKE SURE YOU:  Understand these instructions.  Will watch your condition.  Will get help right away if you are not doing well or get worse.   This information is not intended to replace advice given to you by your  health care provider. Make sure you discuss any questions you have with your health care provider.   Document Released: 10/14/2000 Document Revised: 11/22/2014 Document Reviewed: 12/17/2012 Elsevier Interactive Patient Education [employer]2016 Else[employer] Inc[employer]

## 2015-06-18 NOTE — ED Provider Notes (Signed)
CSN: [id number]     Arrival date & time 06/18/15  4098 History  By signing my name below, I, Phillis Haggis, attest that this documentation has been prepared under the direction and in the presence of Derwood Kaplan, MD. Electronically Signed: Phillis Haggis, ED Scribe. 06/18/2015. 3:53 AM.   Chief Complaint  Patient presents with  . Urinary Retention   The history is provided by the patient. No language interpreter was used.   HPI Comments: Preston Boyd is a 74 y.o. Male with a hx of renal insufficiency, COPD, CKD, and CAD who presents to the Emergency Department complaining of urinary retention onset one day ago. Pt had his foley catheter removed yesterday post op and states that he last emptied his bladder right after the foley removal. He was discharged with Augmentin and Flagyl. He reports associated dysuria, abdominal pain, and decreased urine output. Relative states that he has only been able to void a few drops of urine. 460 cc urine output in ED catheter. Pt reports enlarged prostate, but has not followed up with urology for this problem. Pt denies other symptoms. Pt denies use of blood thinners.   Past Medical History  Diagnosis Date  . DEGENERATIVE JOINT DISEASE, RIGHT HIP     s/p THR 11/2008  . Osteoarthritis of knee     s/p B TKR 07/2009  . GERD   . Hypertension   . RENAL INSUFFICIENCY   . BENIGN PROSTATIC HYPERTROPHY   . MIGRAINE HEADACHE   . COPD (chronic obstructive pulmonary disease) (HCC)   . CKD (chronic kidney disease) stage 3, GFR 30-59 ml/min   . History of cardiovascular stress test 01/2015    Lexiscan Myoview 7/16:  Inferoseptal, inferior and apical ischemia, EF 67%, TID 1.09; intermediate risk  . CAD (coronary artery disease) 01/2015    mild on cath 01/2015, med mgmt rec   Past Surgical History  Procedure Laterality Date  . Tonsillectomy    . Total hip arthroplasty  11/2008    Dr. Luiz Blare  . Replacement total knee bilateral  07/2009    graves  . Cataract  extraction, bilateral  10/2012  . Cardiac catheterization N/A 02/15/2015    Procedure: Left Heart Cath and Coronary Angiography;  Surgeon: Lennette Bihari, MD;  Location: [facility]AS[facility] CV LAB;  Service: Cardiovascular;  Laterality: N/A;  . Inguinal hernia repair Bilateral 1960's  . Cholecystectomy N/A 06/14/2015    Procedure: LAPAROSCOPIC CHOLECYSTECTOMY WITH INTRAOPERATIVE CHOLANGIOGRAM;  Surgeon: Karie Soda, MD;  Location: [facility] ORS;  Service: General;  Laterality: N/A;   Family History  Problem Relation Age of Onset  . Colon cancer Mother   . Lung cancer Father   . Alcohol abuse Other   . Arthritis Other   . Diabetes Other   . Hypertension Other   . Heart disease Other   . Heart attack Maternal Uncle   . Stroke Paternal Grandfather    Social History  Substance Use Topics  . Smoking status: Former Smoker    Quit date: 07/22/1962  . Smokeless tobacco: Never Used  . Alcohol Use: 0.0 oz/week    0 Standard drinks or equivalent per week     Comment: ocassionally    Review of Systems 10 Systems reviewed and all are negative for acute change except as noted in the HPI.  Allergies  Amoxicillin and Other  Home Medications   Prior to Admission medications   Medication Sig Start Date End Date Taking? Authorizing Provider  acetaminophen (TYLENOL) 500 MG tablet  Take 500 mg by mouth every 6 (six) hours as needed for moderate pain.    Yes Historical Provider, MD  amoxicillin-clavulanate (AUGMENTIN) 875-125 MG tablet Take 1 tablet by mouth 2 (two) times daily. 06/17/15  Yes Penny Piarlando Vega, MD  aspirin EC 325 MG tablet Take 1 tablet (325 mg total) by mouth daily. 06/12/15  Yes Newt LukesValerie A Leschber, MD  Flaxseed, Linseed, (FLAX SEED OIL PO) Take 15 mLs by mouth daily.   Yes Historical Provider, MD  Fluticasone-Salmeterol (ADVAIR DISKUS) 250-50 MCG/DOSE AEPB Inhale 1 puff into the lungs 2 (two) times daily as needed (for shortness of breath). 03/07/15  Yes Newt LukesValerie A Leschber, MD  GuaiFENesin (MUCINEX  PO) Take 1 tablet by mouth as needed (CONGESTION).    Yes Historical Provider, MD  HYDROcodone-acetaminophen (NORCO/VICODIN) 5-325 MG tablet Take 1-2 tablets by mouth every 4 (four) hours as needed for moderate pain. 06/14/15  Yes Karie SodaSteven Gross, MD  isosorbide mononitrate (IMDUR) 30 MG 24 hr tablet Take 1 tablet (30 mg total) by mouth daily. 06/12/15  Yes Newt LukesValerie A Leschber, MD  metoprolol tartrate (LOPRESSOR) 25 MG tablet Take 0.5 tablets (12.5 mg total) by mouth 2 (two) times daily. 06/12/15  Yes Newt LukesValerie A Leschber, MD  metroNIDAZOLE (FLAGYL) 500 MG tablet Take 1 tablet (500 mg total) by mouth every 8 (eight) hours. 06/17/15  Yes Penny Piarlando Vega, MD  Olopatadine HCl (PAZEO) 0.7 % SOLN Place 1 drop into both eyes daily as needed (for dry eyes).   Yes Historical Provider, MD  ondansetron (ZOFRAN) 4 MG tablet Take 4 mg by mouth every 8 (eight) hours as needed for nausea or vomiting.   Yes Historical Provider, MD  Propylene Glycol (SYSTANE BALANCE OP) Place 1 drop into both eyes as needed (for dry eyes).   Yes Historical Provider, MD   BP 152/80 mmHg  Pulse 85  Temp(Src) 98.2 F (36.8 C) (Oral)  Resp 18  Ht 6\' 1"  (1.854 m)  Wt 220 lb (99.791 kg)  BMI 29.03 kg/m2  SpO2 94% Physical Exam  Constitutional: He appears well-developed and well-nourished. No distress.  HENT:  Head: Normocephalic and atraumatic.  Mouth/Throat: Oropharynx is clear and moist. No oropharyngeal exudate.  Eyes: Conjunctivae and EOM are normal. Pupils are equal, round, and reactive to light. Right eye exhibits no discharge. Left eye exhibits no discharge. No scleral icterus.  Neck: Normal range of motion. Neck supple. No JVD present. No thyromegaly present.  Cardiovascular: Normal rate, regular rhythm, normal heart sounds and intact distal pulses.  Exam reveals no gallop and no friction rub.   No murmur heard. Pulmonary/Chest: Effort normal and breath sounds normal. No respiratory distress. He has no wheezes. He has no rales.   Anterior lung exam is clear  Abdominal: Soft. Bowel sounds are normal. He exhibits no distension and no mass. There is no tenderness.  Good bowel sounds  Musculoskeletal: Normal range of motion. He exhibits no edema or tenderness.  Lymphadenopathy:    He has no cervical adenopathy.  Neurological: He is alert. Coordination normal.  Skin: Skin is warm and dry. No rash noted. No erythema.  Psychiatric: He has a normal mood and affect. His behavior is normal.  Nursing note and vitals reviewed.   ED Course  Procedures (including critical care time) DIAGNOSTIC STUDIES: Oxygen Saturation is 94% on RA, adequate by my interpretation.    COORDINATION OF CARE: 3:51 AM-Discussed treatment plan which includes labs and follow up with urology with pt at bedside and pt agreed to plan.  Labs Review Labs Reviewed  URINALYSIS, ROUTINE W REFLEX MICROSCOPIC (NOT AT [facility]) - Abnormal; Notable for the following:    Color, Urine AMBER (*)    APPearance CLOUDY (*)    Hgb urine dipstick LARGE (*)    Protein, ur 30 (*)    All other components within normal limits  URINE CULTURE  URINE MICROSCOPIC-ADD ON    Imaging Review No results found. I have personally reviewed and evaluated these images and lab results as part of my medical decision-making.   EKG Interpretation None      MDM   Final diagnoses:  Acute urinary retention    I personally performed the services described in this documentation, which was scribed in my presence. The recorded information has been reviewed and is accurate.  Pt comes in with cc of urinary retention. He had > 400 cc of post void residual - thus foley was placed. PT has hx of BPH, he had a recent chole and went home at 3 pm. He is voiding intermittently at home, but had urgency and spasm like feeling. Likely related to anesthesia or enlarged prostate. Will d.c with foley. UA is clear.    Derwood Kaplan, MD 06/18/15 [phone]

## 2015-06-19 ENCOUNTER — Telehealth: Payer: Self-pay | Admitting: Internal Medicine

## 2015-06-19 DIAGNOSIS — R339 Retention of urine, unspecified: Secondary | ICD-10-CM

## 2015-06-19 DIAGNOSIS — R338 Other retention of urine: Secondary | ICD-10-CM

## 2015-06-19 NOTE — Telephone Encounter (Signed)
Patient is requesting a referral to a nephrologist for his catheter.   He does not have a specific one in mind at this point. No appt scheduled.

## 2015-06-19 NOTE — Telephone Encounter (Signed)
uro order signed - thanks!

## 2015-06-19 NOTE — Telephone Encounter (Signed)
Based on ED notes, pt had some acute urinary retention. Referral pended with that as the associated dx.

## 2015-06-20 ENCOUNTER — Telehealth: Payer: Self-pay | Admitting: *Deleted

## 2015-06-20 LAB — URINE CULTURE
Culture: NO GROWTH
SPECIAL REQUESTS: NORMAL

## 2015-06-20 NOTE — Telephone Encounter (Signed)
Pt was on TCM list admited for acute cholecystis. Had cholecystectomy 11/23. Pt will be f/u with surgeon Dr. Andreas Newportharlse Ewell...Raechel Chute/lmb

## 2015-06-21 ENCOUNTER — Emergency Department (HOSPITAL_COMMUNITY)
Admission: EM | Admit: 2015-06-21 | Discharge: 2015-06-21 | Disposition: A | Discharge status: Home / Self Care | Payer: Medicare HMO | Attending: Emergency Medicine | Admitting: Emergency Medicine

## 2015-06-21 ENCOUNTER — Encounter (HOSPITAL_COMMUNITY): Payer: Self-pay

## 2015-06-21 DIAGNOSIS — Z87891 Personal history of nicotine dependence: Secondary | ICD-10-CM | POA: Insufficient documentation

## 2015-06-21 DIAGNOSIS — Z8719 Personal history of other diseases of the digestive system: Secondary | ICD-10-CM | POA: Diagnosis not present

## 2015-06-21 DIAGNOSIS — T83031A Leakage of indwelling urethral catheter, initial encounter: Secondary | ICD-10-CM | POA: Diagnosis not present

## 2015-06-21 DIAGNOSIS — Z880 Allergy status to penicillin: Secondary | ICD-10-CM | POA: Insufficient documentation

## 2015-06-21 DIAGNOSIS — Z792 Long term (current) use of antibiotics: Secondary | ICD-10-CM | POA: Insufficient documentation

## 2015-06-21 DIAGNOSIS — J449 Chronic obstructive pulmonary disease, unspecified: Secondary | ICD-10-CM | POA: Insufficient documentation

## 2015-06-21 DIAGNOSIS — Z9889 Other specified postprocedural states: Secondary | ICD-10-CM | POA: Insufficient documentation

## 2015-06-21 DIAGNOSIS — Y658 Other specified misadventures during surgical and medical care: Secondary | ICD-10-CM | POA: Insufficient documentation

## 2015-06-21 DIAGNOSIS — N183 Chronic kidney disease, stage 3 (moderate): Secondary | ICD-10-CM | POA: Insufficient documentation

## 2015-06-21 DIAGNOSIS — I129 Hypertensive chronic kidney disease with stage 1 through stage 4 chronic kidney disease, or unspecified chronic kidney disease: Secondary | ICD-10-CM | POA: Diagnosis not present

## 2015-06-21 DIAGNOSIS — M1611 Unilateral primary osteoarthritis, right hip: Secondary | ICD-10-CM | POA: Diagnosis not present

## 2015-06-21 DIAGNOSIS — M179 Osteoarthritis of knee, unspecified: Secondary | ICD-10-CM | POA: Diagnosis not present

## 2015-06-21 DIAGNOSIS — Z7982 Long term (current) use of aspirin: Secondary | ICD-10-CM | POA: Insufficient documentation

## 2015-06-21 DIAGNOSIS — Z7951 Long term (current) use of inhaled steroids: Secondary | ICD-10-CM | POA: Diagnosis not present

## 2015-06-21 DIAGNOSIS — Z87438 Personal history of other diseases of male genital organs: Secondary | ICD-10-CM | POA: Insufficient documentation

## 2015-06-21 DIAGNOSIS — I2510 Atherosclerotic heart disease of native coronary artery without angina pectoris: Secondary | ICD-10-CM | POA: Insufficient documentation

## 2015-06-21 DIAGNOSIS — Z79899 Other long term (current) drug therapy: Secondary | ICD-10-CM | POA: Diagnosis not present

## 2015-06-21 DIAGNOSIS — T839XXA Unspecified complication of genitourinary prosthetic device, implant and graft, initial encounter: Secondary | ICD-10-CM

## 2015-06-21 DIAGNOSIS — T83098D Other mechanical complication of other indwelling urethral catheter, subsequent encounter: Secondary | ICD-10-CM | POA: Diagnosis not present

## 2015-06-21 LAB — URINE MICROSCOPIC-ADD ON

## 2015-06-21 LAB — BASIC METABOLIC PANEL
Anion gap: 10 (ref 5–15)
BUN: 16 mg/dL (ref 6–20)
CALCIUM: 8.7 mg/dL — AB (ref 8.9–10.3)
CO2: 22 mmol/L (ref 22–32)
CREATININE: 1.5 mg/dL — AB (ref 0.61–1.24)
Chloride: 108 mmol/L (ref 101–111)
GFR, EST AFRICAN AMERICAN: 52 mL/min — AB (ref >60)
GFR, EST NON AFRICAN AMERICAN: 44 mL/min — AB (ref >60)
Glucose, Bld: 108 mg/dL — ABNORMAL HIGH (ref 65–99)
Potassium: 3.9 mmol/L (ref 3.5–5.1)
Sodium: 140 mmol/L (ref 135–145)

## 2015-06-21 LAB — URINALYSIS, ROUTINE W REFLEX MICROSCOPIC
BILIRUBIN URINE: NEGATIVE
Glucose, UA: NEGATIVE mg/dL
KETONES UR: NEGATIVE mg/dL
NITRITE: NEGATIVE
PH: 5.5 (ref 5.0–8.0)
PROTEIN: 30 mg/dL — AB
Specific Gravity, Urine: 1.011 (ref 1.005–1.030)

## 2015-06-21 NOTE — ED Notes (Addendum)
Pt c/o urine leaking around catheter and penile pain starting this morning.  Pain score 2/10.  [facility] and had to return x 3 days ago to have it flushed.  Sts "it was leaking then too."   Pt does not have an Insurance underwriterUrologist.  Sts "I'm waiting for them to call me back."

## 2015-06-21 NOTE — Discharge Instructions (Signed)
Please call urologist today to schedule an appointment for later this week for a trial of urination and Foley catheter removal. We did irrigate blood clot from out of your Foley today, and you were draining urine through the Foley normally afterwards. Please return without fail for worsening symptoms including fever, vomiting unable to keep down food or fluids, worsening pain, or any other symptoms concerning to you.

## 2015-06-21 NOTE — ED Provider Notes (Signed)
CSN: [id number]     Arrival date & time 06/21/15  0902 History   First MD Initiated Contact with Patient 06/21/15 0919     Chief Complaint  Patient presents with  . Catheter Issues      (Consider location/radiation/quality/duration/timing/severity/associated sxs/prior Treatment) HPI 74 year old male who presents with leakage around his Foley catheter. Catheter placed in the emergency department 2 days ago for urinary retention after a cholecystectomy. History of BPH, CKD and CAD.  was seen in the ED yesterday, as his Foley was clogged and he was leaking urine from around his Foley.  he had leakage around his Foley again today. Last empty his bag at 6 AM, and continues to drain some urine, although he notes that this is somewhat diminished. No N/V, abd pain, fevers, chills, hematuria.    Past Medical History  Diagnosis Date  . DEGENERATIVE JOINT DISEASE, RIGHT HIP     s/p THR 11/2008  . Osteoarthritis of knee     s/p B TKR 07/2009  . GERD   . Hypertension   . RENAL INSUFFICIENCY   . BENIGN PROSTATIC HYPERTROPHY   . MIGRAINE HEADACHE   . COPD (chronic obstructive pulmonary disease) (HCC)   . CKD (chronic kidney disease) stage 3, GFR 30-59 ml/min   . History of cardiovascular stress test 01/2015    Lexiscan Myoview 7/16:  Inferoseptal, inferior and apical ischemia, EF 67%, TID 1.09; intermediate risk  . CAD (coronary artery disease) 01/2015    mild on cath 01/2015, med mgmt rec   Past Surgical History  Procedure Laterality Date  . Tonsillectomy    . Total hip arthroplasty  11/2008    Dr. Luiz Blare  . Replacement total knee bilateral  07/2009    graves  . Cataract extraction, bilateral  10/2012  . Cardiac catheterization N/A 02/15/2015    Procedure: Left Heart Cath and Coronary Angiography;  Surgeon: Lennette Bihari, MD;  Location: [facility] INVASIVE CV LAB;  Service: Cardiovascular;  Laterality: N/A;  . Inguinal hernia repair Bilateral 1960's  . Cholecystectomy N/A 06/14/2015    Procedure:  LAPAROSCOPIC CHOLECYSTECTOMY WITH INTRAOPERATIVE CHOLANGIOGRAM;  Surgeon: Karie Soda, MD;  Location: [facility] ORS;  Service: General;  Laterality: N/A;   Family History  Problem Relation Age of Onset  . Colon cancer Mother   . Lung cancer Father   . Alcohol abuse Other   . Arthritis Other   . Diabetes Other   . Hypertension Other   . Heart disease Other   . Heart attack Maternal Uncle   . Stroke Paternal Grandfather    Social History  Substance Use Topics  . Smoking status: Former Smoker    Quit date: 07/22/1962  . Smokeless tobacco: Never Used  . Alcohol Use: 0.0 oz/week    0 Standard drinks or equivalent per week     Comment: ocassionally    Review of Systems 10/14 systems reviewed and are negative other than those stated in the HPI    Allergies  Amoxicillin and Other  Home Medications   Prior to Admission medications   Medication Sig Start Date End Date Taking? Authorizing Provider  acetaminophen (TYLENOL) 500 MG tablet Take 500 mg by mouth every 6 (six) hours as needed for moderate pain.    Yes Historical Provider, MD  amoxicillin-clavulanate (AUGMENTIN) 875-125 MG tablet Take 1 tablet by mouth 2 (two) times daily. 06/17/15  Yes Penny Pia, MD  aspirin EC 325 MG tablet Take 1 tablet (325 mg total) by mouth daily. 06/12/15  Yes  Newt LukesValerie A Leschber, MD  Flaxseed, Linseed, (FLAX SEED OIL PO) Take 15 mLs by mouth daily.   Yes Historical Provider, MD  Fluticasone-Salmeterol (ADVAIR DISKUS) 250-50 MCG/DOSE AEPB Inhale 1 puff into the lungs 2 (two) times daily as needed (for shortness of breath). 03/07/15  Yes Newt LukesValerie A Leschber, MD  GuaiFENesin (MUCINEX PO) Take 1 tablet by mouth as needed (CONGESTION).    Yes Historical Provider, MD  HYDROcodone-acetaminophen (NORCO/VICODIN) 5-325 MG tablet Take 1-2 tablets by mouth every 4 (four) hours as needed for moderate pain. 06/14/15  Yes Karie SodaSteven Gross, MD  isosorbide mononitrate (IMDUR) 30 MG 24 hr tablet Take 1 tablet (30 mg total) by  mouth daily. 06/12/15  Yes Newt LukesValerie A Leschber, MD  metoprolol tartrate (LOPRESSOR) 25 MG tablet Take 0.5 tablets (12.5 mg total) by mouth 2 (two) times daily. 06/12/15  Yes Newt LukesValerie A Leschber, MD  metroNIDAZOLE (FLAGYL) 500 MG tablet Take 1 tablet (500 mg total) by mouth every 8 (eight) hours. 06/17/15  Yes Penny Piarlando Vega, MD  Olopatadine HCl (PAZEO) 0.7 % SOLN Place 1 drop into both eyes daily as needed (for dry eyes).   Yes Historical Provider, MD  ondansetron (ZOFRAN) 4 MG tablet Take 4 mg by mouth every 8 (eight) hours as needed for nausea or vomiting.   Yes Historical Provider, MD  Propylene Glycol (SYSTANE BALANCE OP) Place 1 drop into both eyes as needed (for dry eyes).   Yes Historical Provider, MD   BP 150/85 mmHg  Pulse 81  Temp(Src) 98 F (36.7 C) (Oral)  Resp 20  SpO2 99% Physical Exam Physical Exam  Nursing note and vitals reviewed. Constitutional: Well developed, well nourished, non-toxic, and in no acute distress Head: Normocephalic and atraumatic.  Mouth/Throat: Oropharynx is clear and moist.  Neck: Normal range of motion. Neck supple.  Cardiovascular: Normal rate and regular rhythm.   Pulmonary/Chest: Effort normal and breath sounds normal.  Abdominal: Soft. There is no tenderness. There is no rebound and no guarding. Surgical wounds clean, dry and in tact GU: foley catheter in place with yellow urine in his bag Musculoskeletal: Normal range of motion.  Neurological: Alert, no facial droop, fluent speech, moves all extremities symmetrically Skin: Skin is warm and dry.  Psychiatric: Cooperative  ED Course  Procedures (including critical care time) Labs Review Labs Reviewed  URINALYSIS, ROUTINE W REFLEX MICROSCOPIC (NOT AT [facility]) - Abnormal; Notable for the following:    APPearance CLOUDY (*)    Hgb urine dipstick LARGE (*)    Protein, ur 30 (*)    Leukocytes, UA TRACE (*)    All other components within normal limits  BASIC METABOLIC PANEL - Abnormal; Notable for  the following:    Glucose, Bld 108 (*)    Creatinine, Ser 1.50 (*)    Calcium 8.7 (*)    GFR calc non Af Amer 44 (*)    GFR calc Af Amer 52 (*)    All other components within normal limits  URINE MICROSCOPIC-ADD ON - Abnormal; Notable for the following:    Squamous Epithelial / LPF 0-5 (*)    Bacteria, UA FEW (*)    All other components within normal limits    I have personally reviewed and evaluated these images and lab results as part of my medical decision-making.   MDM   Final diagnoses:  Foley catheter problem, initial encounter [facility](HCC)     in short this is a 74 year old male who presents with leakage around his Foley catheter. He is nontoxic in  no acute distress. Vital signs are non-concerning. I he has a soft and benign abdomen. No leakage noted around his Foley catheter currently, and a bladder scan reveals no urine in his bladder. His Foley catheter was flushed, and on suction he was noted to have a large amount of small blood clots. Foley was irrigated until clots fully cleared. On reevaluation, patient was draining appropriate urine without leakage. He has baseline renal function, and UA was some blood consistent with recent hematuria but there is no evidence of infection. I discussed continued urology follow-up for trial of urination later this week.    Lavera Guise, MD 06/21/15 [phone]

## 2015-06-21 NOTE — ED Notes (Signed)
No leakage noted from catheter/urethral site at this time. Leg bag noted to have approximately 50 cc of amber colored urine. No other c/c.

## 2015-06-21 NOTE — ED Notes (Signed)
Pt ambulated to BR and back to room w/o assistance 

## 2015-06-21 NOTE — ED Notes (Signed)
AVS explained in detail. Contacted [facility] for patient-no appointments available at this time. Pt knows Zella BallRobin will call him within the hour for appointment set-up. Catheter is still draining at this time. Pt knows to continue taking antibiotics. No other c/c. Ambulatory with steady gait.

## 2015-06-21 NOTE — ED Notes (Signed)
Pt bladder scan result was 0 (zero) volume in bladder

## 2015-06-21 NOTE — ED Notes (Signed)
Flushed catheter per order. Observed dark pieces floating in returning fluid. Notified Dr Verdie MosherLiu. Pt given water per order

## 2015-06-22 DIAGNOSIS — R338 Other retention of urine: Secondary | ICD-10-CM | POA: Diagnosis not present

## 2015-06-22 DIAGNOSIS — N401 Enlarged prostate with lower urinary tract symptoms: Secondary | ICD-10-CM | POA: Diagnosis not present

## 2015-06-22 DIAGNOSIS — R3914 Feeling of incomplete bladder emptying: Secondary | ICD-10-CM | POA: Diagnosis not present

## 2015-06-27 ENCOUNTER — Encounter: Payer: Self-pay | Admitting: Cardiology

## 2015-06-27 ENCOUNTER — Ambulatory Visit (INDEPENDENT_AMBULATORY_CARE_PROVIDER_SITE_OTHER): Payer: Medicare HMO | Admitting: Cardiology

## 2015-06-27 VITALS — BP 102/62 | HR 72 | Ht 73.0 in | Wt 216.8 lb

## 2015-06-27 DIAGNOSIS — I10 Essential (primary) hypertension: Secondary | ICD-10-CM

## 2015-06-27 DIAGNOSIS — I2510 Atherosclerotic heart disease of native coronary artery without angina pectoris: Secondary | ICD-10-CM | POA: Diagnosis not present

## 2015-06-27 DIAGNOSIS — N183 Chronic kidney disease, stage 3 unspecified: Secondary | ICD-10-CM

## 2015-06-27 NOTE — Progress Notes (Signed)
Cardiology Office Note   Date:  06/27/2015   ID:  Preston Boyd, DOB 1941-07-19, MRN [id number]  PCP:  Rene Paci, MD  Cardiologist:  Dr. Tresa Endo    Chief Complaint  Patient presents with  . Coronary Artery Disease    no pain      History of Present Illness: Preston Boyd is a 74 y.o. male who presents for post cath follow up.  Pt was having Rt shoulder pain and abnormal nuc study.  Cath was done and mild non obstructive disease.     He has a hx of cardiac cath after + nuc study.    Prox LAD lesion, 10% stenosed.  RPDA lesion, 40% stenosed.  The left ventricular systolic function is normal.  Normal LV systolic function without focal segmental wall motion abnormalities.  Mild coronary obstructive disease with luminal irregularity and narrowing of 10% in the proximal LAD, a normal left circumflex coronary artery, and a dominant RCA with smooth 40 to less than 50% ostial PDA stenosis.  RECOMMENDATION:  Medical therapy will be initiated in light of the patient's ischemia noted on his nuclear perfusion study with low-dose nitrates and beta blocker therapy. Aggressive lipid intervention will be beneficial to potential induce plaque regression.      Other hx of COPD, GERD, HTN,  and now urinary retention since his lap chole.  Most likely his shoulder pain was from gallbladder.    Today no further pain.  His lisinopril HCTZ was stopped due to dizziness, he continues on metoprolol and imdur. But BP today borderline.   He is recovering from his recent surgery.  Still with RUQ pain post op but improving to see surgeon soon. .    Past Medical History  Diagnosis Date  . DEGENERATIVE JOINT DISEASE, RIGHT HIP     s/p THR 11/2008  . Osteoarthritis of knee     s/p B TKR 07/2009  . GERD   . Hypertension   . RENAL INSUFFICIENCY   . BENIGN PROSTATIC HYPERTROPHY   . MIGRAINE HEADACHE   . COPD (chronic obstructive pulmonary disease) (HCC)   . CKD (chronic kidney  disease) stage 3, GFR 30-59 ml/min   . History of cardiovascular stress test 01/2015    Lexiscan Myoview 7/16:  Inferoseptal, inferior and apical ischemia, EF 67%, TID 1.09; intermediate risk  . CAD (coronary artery disease) 01/2015    mild on cath 01/2015, med mgmt rec    Past Surgical History  Procedure Laterality Date  . Tonsillectomy    . Total hip arthroplasty  11/2008    Dr. Luiz Blare  . Replacement total knee bilateral  07/2009    graves  . Cataract extraction, bilateral  10/2012  . Cardiac catheterization N/A 02/15/2015    Procedure: Left Heart Cath and Coronary Angiography;  Surgeon: Lennette Bihari, MD;  Location: [facility] INVASIVE CV LAB;  Service: Cardiovascular;  Laterality: N/A;  . Inguinal hernia repair Bilateral 1960's  . Cholecystectomy N/A 06/14/2015    Procedure: LAPAROSCOPIC CHOLECYSTECTOMY WITH INTRAOPERATIVE CHOLANGIOGRAM;  Surgeon: Karie Soda, MD;  Location: [facility] ORS;  Service: General;  Laterality: N/A;     Current Outpatient Prescriptions  Medication Sig Dispense Refill  . acetaminophen (TYLENOL) 500 MG tablet Take 500 mg by mouth every 6 (six) hours as needed for moderate pain.     Marland Kitchen aspirin EC 325 MG tablet Take 1 tablet (325 mg total) by mouth daily. 100 tablet 3  . Flaxseed, Linseed, (FLAX SEED OIL PO) Take 15 mLs  by mouth daily.    . Fluticasone-Salmeterol (ADVAIR DISKUS) 250-50 MCG/DOSE AEPB Inhale 1 puff into the lungs 2 (two) times daily as needed (for shortness of breath). 60 each 3  . GuaiFENesin (MUCINEX PO) Take 1 tablet by mouth as needed (CONGESTION).     Marland Kitchen. HYDROcodone-acetaminophen (NORCO/VICODIN) 5-325 MG tablet Take 1-2 tablets by mouth every 4 (four) hours as needed for moderate pain. 40 tablet 0  . isosorbide mononitrate (IMDUR) 30 MG 24 hr tablet Take 1 tablet (30 mg total) by mouth daily. 30 tablet 6  . metoprolol tartrate (LOPRESSOR) 25 MG tablet Take 0.5 tablets (12.5 mg total) by mouth 2 (two) times daily. 30 tablet 6  . Olopatadine HCl (PAZEO) 0.7 %  SOLN Place 1 drop into both eyes daily as needed (for dry eyes).    . ondansetron (ZOFRAN) 4 MG tablet Take 4 mg by mouth every 8 (eight) hours as needed for nausea or vomiting.    Marland Kitchen. Propylene Glycol (SYSTANE BALANCE OP) Place 1 drop into both eyes as needed (for dry eyes).     No current facility-administered medications for this visit.    Allergies:   Amoxicillin and Other    Social History:  The patient  reports that he quit smoking about 52 years ago. He has never used smokeless tobacco. He reports that he drinks alcohol. He reports that he does not use illicit drugs.   Family History:  The patient's family history includes Alcohol abuse in his other; Arthritis in his other; Colon cancer in his mother; Diabetes in his other; Heart attack in his maternal uncle; Heart disease in his other; Hypertension in his other; Lung cancer in his father; Stroke in his paternal grandfather.    ROS:  General:no colds or fevers,  weight down 4 lbs due to surgery Skin:no rashes or ulcers HEENT:no blurred vision, no congestion CV:see HPI PUL:see HPI GI:no diarrhea constipation or melena, no indigestion GU:no hematuria, no dysuria- recent retenion requiring catheter- has followed up with urology and cath has been removed and doing better.  MS:no joint pain, no claudication Neuro:no syncope, no lightheadedness Endo:no diabetes, no thyroid disease  Wt Readings from Last 3 Encounters:  06/27/15 216 lb 12.8 oz (98.34 kg)  06/18/15 220 lb (99.791 kg)  06/13/15 219 lb (99.338 kg)     PHYSICAL EXAM: VS:  BP 102/62 mmHg  Pulse 72  Ht 6\' 1"  (1.854 m)  Wt 216 lb 12.8 oz (98.34 kg)  BMI 28.61 kg/m2  SpO2 99% , BMI Body mass index is 28.61 kg/(m^2). General:Pleasant affect, NAD Skin:Warm and dry, brisk capillary refill HEENT:normocephalic, sclera clear, mucus membranes moist Neck:supple, no JVD, no bruits  Heart:S1S2 RRR without murmur, gallup, rub or click Lungs:clear without rales, rhonchi, or  wheezes ZOX:WRUEAbd:soft, non tender, + BS, do not palpate liver spleen or masses Ext:no lower ext edema, 2+ pedal pulses, 2+ radial pulses Neuro:alert and oriented X 3, MAE, follows commands, + facial symmetry    EKG:  EKG is NOT ordered today.  Recent Labs: 06/15/2015: ALT 67*; Hemoglobin 13.4; Platelets 212 06/21/2015: BUN 16; Creatinine, Ser 1.50*; Potassium 3.9; Sodium 140    Lipid Panel    Component Value Date/Time   CHOL 150 06/12/2015 1038   CHOL 162 12/02/2014 0806   TRIG 91.0 06/12/2015 1038   TRIG 124 12/02/2014 0806   HDL 57.00 06/12/2015 1038   HDL 36* 12/02/2014 0806   CHOLHDL 3 06/12/2015 1038   VLDL 18.2 06/12/2015 1038   LDLCALC 75 06/12/2015  1038   LDLCALC 101* 12/02/2014 9562       Other studies Reviewed: Additional studies/ records that were reviewed today include: cath and VO of PCP.Marland Kitchen   ASSESSMENT AND PLAN:  1.  Nonobstructive CAD, but no angina, will stop imdur.  Continue BB. Would add low dose statin once recovered from gall bladder surgery.  He will follow up with Dr. Tresa Endo in 1 year.  Will ask PCP to follow statins. But with known CAD would add low dose.  2. HTN controlled.  3.  Recent chole for gallbaldder disease, recovering.       Current medicines are reviewed with the patient today.  The patient Has no concerns regarding medicines.  The following changes have been made:  See above Labs/ tests ordered today include:see above  Disposition:   FU:  see above  Signed, Leone Brand, NP  06/27/2015 9:59 AM    [facility] [address] [place], [place], [place]  [address]/ [address] [place], [place] Phone: [phone]; Fax: [phone]  [phone]

## 2015-06-27 NOTE — Patient Instructions (Addendum)
Medication Instructions:  Your physician has recommended you make the following change in your medication: STOP  ISOSORBIDE  CONTINUE   METOPROLOL  Labwork: NONE  Testing/Procedures: NONE  Follow-Up: Your physician wants you to follow-up in: YEAR WITH  DR Carmin RichmondKELLY You will receive a reminder letter in the mail two months in advance. If you don't receive a letter, please call our office to schedule the follow-up appointment.   Any Other Special Instructions Will Be Listed Below (If Applicable).     If you need a refill on your cardiac medications before your next appointment, please call your pharmacy.

## 2015-06-28 ENCOUNTER — Telehealth: Payer: Self-pay | Admitting: Internal Medicine

## 2015-06-28 MED ORDER — FLUTICASONE-SALMETEROL 250-50 MCG/DOSE IN AEPB: 1.0000 | INHALATION_SPRAY | Freq: Two times a day (BID) | RESPIRATORY_TRACT | Status: DC | PRN

## 2015-06-28 NOTE — Telephone Encounter (Signed)
Pt called in and needs refill on his Fluticasone-Salmeterol (ADVAIR DISKUS) 250-50 MCG/DOSE AEPB [[id number]][[id number]]   Pharmacy- [facility] on file  Best number [phone]

## 2015-06-28 NOTE — Telephone Encounter (Signed)
erx done

## 2015-07-06 DIAGNOSIS — R338 Other retention of urine: Secondary | ICD-10-CM | POA: Diagnosis not present

## 2015-07-06 DIAGNOSIS — N401 Enlarged prostate with lower urinary tract symptoms: Secondary | ICD-10-CM | POA: Diagnosis not present

## 2015-11-21 DIAGNOSIS — N183 Chronic kidney disease, stage 3 (moderate): Secondary | ICD-10-CM | POA: Diagnosis not present

## 2015-11-21 DIAGNOSIS — D631 Anemia in chronic kidney disease: Secondary | ICD-10-CM | POA: Diagnosis not present

## 2016-01-09 DIAGNOSIS — H04123 Dry eye syndrome of bilateral lacrimal glands: Secondary | ICD-10-CM | POA: Diagnosis not present

## 2016-01-09 DIAGNOSIS — Z961 Presence of intraocular lens: Secondary | ICD-10-CM | POA: Diagnosis not present

## 2016-02-07 ENCOUNTER — Other Ambulatory Visit: Payer: Self-pay | Admitting: Internal Medicine

## 2016-06-14 ENCOUNTER — Other Ambulatory Visit: Payer: Self-pay | Admitting: Internal Medicine

## 2016-06-15 NOTE — Telephone Encounter (Signed)
Pt has an appt with Burns in December.

## 2016-06-16 ENCOUNTER — Encounter: Payer: Self-pay | Admitting: Internal Medicine

## 2016-06-16 NOTE — Patient Instructions (Addendum)
  Test(s) ordered today. Your results will be released to MyChart (or called to you) after review, usually within 72hours after test completion. If any changes need to be made, you will be notified at that same time.  All other Health Maintenance issues reviewed.   All recommended immunizations and age-appropriate screenings are up-to-date or discussed.  Flu immunization administered today.    Medications reviewed and updated.  No changes recommended at this time.  Your prescription(s) have been submitted to your pharmacy. Please take as directed and contact our office if you believe you are having problem(s) with the medication(s).   Please followup in one year   

## 2016-06-16 NOTE — Progress Notes (Signed)
Subjective:    Patient ID: Preston Boyd, male    DOB: 1940/09/23, 75 y.o.   MRN: [id number]  HPI  He is here to establish with a new pcp.   The patient is here for follow up.   CAD - mild, nonobstructing, hyperlipidemia, Hypertension: He is taking his medication daily. He is compliant with a low sodium diet.  He does not always eat as healthy as he should.  He denies chest pain, palpitations, edema, regular shortness of breath and regular headaches. He does have occasional shortness of bot with activity and a regular basis. He is exercising regularly - gym 3/weeks and walks dog.  He does not monitor his blood pressure at home.    CKD:  He is not seeing a kidney doctor.  He drinks not as much water as he should.  He does not take any nsaids.    Hyperglycemia:  He does not eat as good as he should.  He is exercising regularly.    COPD:  He takes advair as needed.  He has occasional cough and wheezing and uses the advair then.  He has occasional SOB, which is not new.    His mom died from colon cancer.  He has never had a colonoscopy and does not want to have one.  He does not want to have cologuard test as well.   Medications and allergies reviewed with patient and updated if appropriate.  Patient Active Problem List   Diagnosis Date Noted  . Hyponatremia 06/13/2015  . Elevated bilirubin 06/13/2015  . CAD (coronary artery disease) 01/20/2015  . Hyperlipidemia 12/05/2014  . Hyperglycemia 12/01/2014  . CKD (chronic kidney disease) stage 3, GFR 30-59 ml/min   . COPD (chronic obstructive pulmonary disease) (HCC)   . MIGRAINE HEADACHE 07/07/2009  . Hypertension 07/07/2009  . GERD 07/07/2009  . Nodular hyperplasia of prostate gland 07/07/2009  . DEGENERATIVE JOINT DISEASE, RIGHT HIP 07/07/2009  . ARTHRITIS 07/07/2009    Current Outpatient Prescriptions on File Prior to Visit  Medication Sig Dispense Refill  . acetaminophen (TYLENOL) 500 MG tablet Take 500 mg by mouth every 6  (six) hours as needed for moderate pain.     Marland Kitchen. aspirin EC 325 MG tablet Take 1 tablet (325 mg total) by mouth daily. 100 tablet 3  . Flaxseed, Linseed, (FLAX SEED OIL PO) Take 15 mLs by mouth daily.    . Fluticasone-Salmeterol (ADVAIR DISKUS) 250-50 MCG/DOSE AEPB Inhale 1 puff into the lungs 2 (two) times daily as needed (for shortness of breath). 60 each 3  . GuaiFENesin (MUCINEX PO) Take 1 tablet by mouth as needed (CONGESTION).     Marland Kitchen. isosorbide mononitrate (IMDUR) 30 MG 24 hr tablet Take 1 tablet (30 mg total) by mouth daily. 30 tablet 6  . metoprolol tartrate (LOPRESSOR) 25 MG tablet Take 0.5 tablets (12.5 mg total) by mouth 2 (two) times daily. Keep Nov appt w/new PCP for future refills 30 tablet 5  . Olopatadine HCl (PAZEO) 0.7 % SOLN Place 1 drop into both eyes daily as needed (for dry eyes).    . ondansetron (ZOFRAN) 4 MG tablet Take 4 mg by mouth every 8 (eight) hours as needed for nausea or vomiting.    Marland Kitchen. Propylene Glycol (SYSTANE BALANCE OP) Place 1 drop into both eyes as needed (for dry eyes).     No current facility-administered medications on file prior to visit.     Past Medical History:  Diagnosis Date  . Acute cholecystitis  s/p lap cholecystectomy 06/14/2015 06/13/2015  . BENIGN PROSTATIC HYPERTROPHY   . CAD (coronary artery disease) 01/2015   mild on cath 01/2015, med mgmt rec  . CKD (chronic kidney disease) stage 3, GFR 30-59 ml/min   . COPD (chronic obstructive pulmonary disease) (HCC)   . DEGENERATIVE JOINT DISEASE, RIGHT HIP    s/p THR 11/2008  . GERD   . History of cardiovascular stress test 01/2015   Lexiscan Myoview 7/16:  Inferoseptal, inferior and apical ischemia, EF 67%, TID 1.09; intermediate risk  . Hypertension   . MIGRAINE HEADACHE   . Osteoarthritis of knee    s/p B TKR 07/2009  . RENAL INSUFFICIENCY     Past Surgical History:  Procedure Laterality Date  . CARDIAC CATHETERIZATION N/A 02/15/2015   Procedure: Left Heart Cath and Coronary Angiography;   Surgeon: Lennette Biharihomas A Kelly, MD;  [facility];  Service: Cardiovascular;  Laterality: N/A;  . CATARACT EXTRACTION, BILATERAL  10/2012  . CHOLECYSTECTOMY N/A 06/14/2015   Procedure: LAPAROSCOPIC CHOLECYSTECTOMY WITH INTRAOPERATIVE CHOLANGIOGRAM;  Surgeon: Karie SodaSteven Gross, MD;  Location: [facility];  Service: General;  Laterality: N/A;  . INGUINAL HERNIA REPAIR Bilateral 1960's  . REPLACEMENT TOTAL KNEE BILATERAL  07/2009   graves  . TONSILLECTOMY    . TOTAL HIP ARTHROPLASTY  11/2008   Dr. Luiz BlareGraves    Social History   Social History  . Marital status: Single    Spouse name: N/A  . Number of children: 0  . Years of education: 5716   Occupational History  . Retired    Social History Main Topics  . Smoking status: Former Smoker    Quit date: 07/22/1962  . Smokeless tobacco: Never Used  . Alcohol use 0.0 oz/week     Comment: ocassionally  . Drug use: No  . Sexual activity: No   Other Topics Concern  . Not on file   Social History Narrative   Single, lives alone. 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 Colon cancer Mother   . Lung cancer Father   . Alcohol abuse Other   . Arthritis Other   . Diabetes Other   . Hypertension Other   . Heart disease Other   . Heart attack Maternal Uncle   . Stroke Paternal Grandfather     Review of Systems  Constitutional: Negative for fever.  Respiratory: Positive for cough (occ), shortness of breath (sometimes with exertion) and wheezing (rare).   Cardiovascular: Negative for chest pain, palpitations and leg swelling.  Gastrointestinal:       GERD rare  Neurological: Negative for dizziness, light-headedness and headaches.       Objective:   Vitals:   06/17/16 0852  BP: 110/72  Pulse: 63  Resp: 16  Temp: 98.2 F (36.8 C)   Filed Weights   06/17/16 0852  Weight: 250 lb (113.4 kg)   Body mass index is 32.98 kg/m.    Physical Exam    Constitutional: Appears well-developed and well-nourished. No distress.  HENT:  Head: Normocephalic and atraumatic.  Neck: Neck supple. No tracheal deviation present. No thyromegaly present.  No cervical lymphadenopathy Cardiovascular: Normal rate, regular rhythm and normal heart sounds.   No murmur heard. No carotid bruit .  No edema Pulmonary/Chest: Effort normal and breath sounds normal. No respiratory distress. No has no wheezes. No rales.  Skin: Skin is warm and dry. Not  diaphoretic.  Psychiatric: Normal mood and affect. Behavior is normal.      Assessment & Plan:   Flu vaccine today Discussed pneumonia vaccine-he believes he has had this Discussed colonoscopy:-Deferred both  See Problem List for Assessment and Plan of chronic medical problems.   Follow-up annually

## 2016-06-17 ENCOUNTER — Ambulatory Visit (INDEPENDENT_AMBULATORY_CARE_PROVIDER_SITE_OTHER): Payer: Medicare HMO | Admitting: Internal Medicine

## 2016-06-17 ENCOUNTER — Other Ambulatory Visit (INDEPENDENT_AMBULATORY_CARE_PROVIDER_SITE_OTHER): Payer: Medicare HMO

## 2016-06-17 ENCOUNTER — Encounter: Payer: Self-pay | Admitting: Internal Medicine

## 2016-06-17 VITALS — BP 110/72 | HR 63 | Temp 98.2°F | Resp 16 | Ht 73.0 in | Wt 250.0 lb

## 2016-06-17 DIAGNOSIS — E7800 Pure hypercholesterolemia, unspecified: Secondary | ICD-10-CM

## 2016-06-17 DIAGNOSIS — R739 Hyperglycemia, unspecified: Secondary | ICD-10-CM

## 2016-06-17 DIAGNOSIS — N183 Chronic kidney disease, stage 3 unspecified: Secondary | ICD-10-CM

## 2016-06-17 DIAGNOSIS — I2510 Atherosclerotic heart disease of native coronary artery without angina pectoris: Secondary | ICD-10-CM

## 2016-06-17 DIAGNOSIS — I10 Essential (primary) hypertension: Secondary | ICD-10-CM

## 2016-06-17 DIAGNOSIS — J449 Chronic obstructive pulmonary disease, unspecified: Secondary | ICD-10-CM

## 2016-06-17 DIAGNOSIS — Z23 Encounter for immunization: Secondary | ICD-10-CM | POA: Diagnosis not present

## 2016-06-17 DIAGNOSIS — N400 Enlarged prostate without lower urinary tract symptoms: Secondary | ICD-10-CM

## 2016-06-17 LAB — COMPREHENSIVE METABOLIC PANEL
ALBUMIN: 4.5 g/dL (ref 3.5–5.2)
ALT: 16 U/L (ref 0–53)
AST: 18 U/L (ref 0–37)
Alkaline Phosphatase: 55 U/L (ref 39–117)
BILIRUBIN TOTAL: 0.8 mg/dL (ref 0.2–1.2)
BUN: 22 mg/dL (ref 6–23)
CALCIUM: 10 mg/dL (ref 8.4–10.5)
CO2: 26 mEq/L (ref 19–32)
CREATININE: 1.76 mg/dL — AB (ref 0.40–1.50)
Chloride: 104 mEq/L (ref 96–112)
GFR: 40.39 mL/min — ABNORMAL LOW (ref >60.00)
Glucose, Bld: 115 mg/dL — ABNORMAL HIGH (ref 70–99)
Potassium: 5.1 mEq/L (ref 3.5–5.1)
Sodium: 139 mEq/L (ref 135–145)
TOTAL PROTEIN: 7.5 g/dL (ref 6.0–8.3)

## 2016-06-17 LAB — CBC WITH DIFFERENTIAL/PLATELET
BASOS ABS: 0.1 10*3/uL (ref 0.0–0.1)
Basophils Relative: 0.6 % (ref 0.0–3.0)
EOS ABS: 0.3 10*3/uL (ref 0.0–0.7)
Eosinophils Relative: 3.6 % (ref 0.0–5.0)
HEMATOCRIT: 52.3 % — AB (ref 39.0–52.0)
HEMOGLOBIN: 17.9 g/dL — AB (ref 13.0–17.0)
LYMPHS PCT: 29.6 % (ref 12.0–46.0)
Lymphs Abs: 2.4 10*3/uL (ref 0.7–4.0)
MCHC: 34.2 g/dL (ref 30.0–36.0)
MCV: 89.4 fl (ref 78.0–100.0)
MONO ABS: 0.5 10*3/uL (ref 0.1–1.0)
Monocytes Relative: 6.1 % (ref 3.0–12.0)
Neutro Abs: 4.8 10*3/uL (ref 1.4–7.7)
Neutrophils Relative %: 60.1 % (ref 43.0–77.0)
PLATELETS: 196 10*3/uL (ref 150.0–400.0)
RBC: 5.84 Mil/uL — ABNORMAL HIGH (ref 4.22–5.81)
RDW: 14.2 % (ref 11.5–15.5)
WBC: 8 10*3/uL (ref 4.0–10.5)

## 2016-06-17 LAB — LIPID PANEL
CHOLESTEROL: 168 mg/dL (ref 0–200)
HDL: 45.8 mg/dL (ref >39.00)
LDL CALC: 100 mg/dL — AB (ref 0–99)
NonHDL: 122.25
TRIGLYCERIDES: 111 mg/dL (ref 0.0–149.0)
Total CHOL/HDL Ratio: 4
VLDL: 22.2 mg/dL (ref 0.0–40.0)

## 2016-06-17 LAB — TSH: TSH: 1.98 u[IU]/mL (ref 0.35–4.50)

## 2016-06-17 LAB — HEMOGLOBIN A1C: HEMOGLOBIN A1C: 5.8 % (ref 4.6–6.5)

## 2016-06-17 MED ORDER — FLUTICASONE-SALMETEROL 250-50 MCG/DOSE IN AEPB
1.0000 | INHALATION_SPRAY | Freq: Two times a day (BID) | RESPIRATORY_TRACT | 3 refills | Status: DC | PRN
Start: 2016-06-17 — End: 2017-12-18

## 2016-06-17 MED ORDER — METOPROLOL TARTRATE 25 MG PO TABS: 12.5000 mg | ORAL_TABLET | Freq: Two times a day (BID) | ORAL | 5 refills | Status: DC

## 2016-06-17 NOTE — Assessment & Plan Note (Signed)
BP well controlled Current regimen effective and well tolerated Continue current medications at current doses -- has been taking isosorbide mononitrate intermittently-advised him to discontinue this CMP

## 2016-06-17 NOTE — Progress Notes (Signed)
Pre visit review using our clinic review tool, if applicable. No additional management support is needed unless otherwise documented below in the visit note. 

## 2016-06-17 NOTE — Assessment & Plan Note (Signed)
Does not want testing for prostate cancer

## 2016-06-17 NOTE — Assessment & Plan Note (Signed)
Check lipid, CMP, TSH Has nonobstructing, mild CAD-should ideally be on a statin We'll see what lipid panel looks like a likely will recommend starting a statin

## 2016-06-17 NOTE — Assessment & Plan Note (Signed)
Check a1c 

## 2016-06-17 NOTE — Assessment & Plan Note (Signed)
Will follow-up with cardiology No active symptoms Continue aspirin Check lipid panel, CMP and consider statin Continue regular exercise Encouraged weight loss

## 2016-06-17 NOTE — Assessment & Plan Note (Signed)
cmp Does not taking nsaids Increase water intake

## 2016-06-20 ENCOUNTER — Other Ambulatory Visit: Payer: Self-pay | Admitting: Internal Medicine

## 2016-06-20 ENCOUNTER — Other Ambulatory Visit: Payer: Self-pay | Admitting: Emergency Medicine

## 2016-06-20 DIAGNOSIS — R718 Other abnormality of red blood cells: Secondary | ICD-10-CM

## 2016-06-20 MED ORDER — ATORVASTATIN CALCIUM 20 MG PO TABS: 20.0000 mg | ORAL_TABLET | Freq: Every day | ORAL | 3 refills | Status: DC

## 2016-07-18 ENCOUNTER — Ambulatory Visit (INDEPENDENT_AMBULATORY_CARE_PROVIDER_SITE_OTHER): Payer: Medicare HMO

## 2016-07-18 DIAGNOSIS — Z23 Encounter for immunization: Secondary | ICD-10-CM | POA: Diagnosis not present

## 2017-01-07 DIAGNOSIS — H04123 Dry eye syndrome of bilateral lacrimal glands: Secondary | ICD-10-CM | POA: Diagnosis not present

## 2017-01-07 DIAGNOSIS — Z961 Presence of intraocular lens: Secondary | ICD-10-CM | POA: Diagnosis not present

## 2017-01-07 DIAGNOSIS — H11153 Pinguecula, bilateral: Secondary | ICD-10-CM | POA: Diagnosis not present

## 2017-03-26 NOTE — Progress Notes (Signed)
Subjective:    Patient ID: Preston Boyd, male    DOB: September 07, 1940, 76 y.o.   MRN: [id number]  HPI He is here for an acute visit.   Right flank pain:  It started 6-8 week ago.  It initially started out feeling like a muscle pull.  It has gotten more annoying and more frequent.  It is a mild discomfort more than a pain.  The pain is there most of the time at this point.  It is not related to activity.  He has had kidney stones in the past and this is not as bad as those.   He has had some cough, wheeze, sob but these are chronic.  He has occasional diarrhea, which has not increased.  He has difficulty urinating, but that is not new.    He denies fever, chills, abdominal pain, change in bowels, nausea, dysuria, blood in urine.    Medications and allergies reviewed with patient and updated if appropriate.  Patient Active Problem List   Diagnosis Date Noted  . CAD (coronary artery disease) 01/20/2015  . Hyperlipidemia 12/05/2014  . Hyperglycemia 12/01/2014  . CKD (chronic kidney disease) stage 3, GFR 30-59 ml/min   . COPD (chronic obstructive pulmonary disease) (HCC)   . MIGRAINE HEADACHE 07/07/2009  . Hypertension 07/07/2009  . Nodular hyperplasia of prostate gland 07/07/2009  . DEGENERATIVE JOINT DISEASE, RIGHT HIP 07/07/2009  . ARTHRITIS 07/07/2009    Current Outpatient Prescriptions on File Prior to Visit  Medication Sig Dispense Refill  . acetaminophen (TYLENOL) 500 MG tablet Take 500 mg by mouth every 6 (six) hours as needed for moderate pain.     Marland Kitchen aspirin EC 325 MG tablet Take 1 tablet (325 mg total) by mouth daily. 100 tablet 3  . atorvastatin (LIPITOR) 20 MG tablet Take 1 tablet (20 mg total) by mouth daily. 90 tablet 3  . Flaxseed, Linseed, (FLAX SEED OIL PO) Take 15 mLs by mouth daily.    . Fluticasone-Salmeterol (ADVAIR DISKUS) 250-50 MCG/DOSE AEPB Inhale 1 puff into the lungs 2 (two) times daily as needed (for shortness of breath). 60 each 3  . GuaiFENesin  (MUCINEX PO) Take 1 tablet by mouth as needed (CONGESTION).     Marland Kitchen metoprolol tartrate (LOPRESSOR) 25 MG tablet Take 0.5 tablets (12.5 mg total) by mouth 2 (two) times daily. 30 tablet 5  . Olopatadine HCl (PAZEO) 0.7 % SOLN Place 1 drop into both eyes daily as needed (for dry eyes).    . Propylene Glycol (SYSTANE BALANCE OP) Place 1 drop into both eyes as needed (for dry eyes).     No current facility-administered medications on file prior to visit.     Past Medical History:  Diagnosis Date  . Acute cholecystitis s/p lap cholecystectomy 06/14/2015 06/13/2015  . BENIGN PROSTATIC HYPERTROPHY   . CAD (coronary artery disease) 01/2015   mild on cath 01/2015, med mgmt rec  . CKD (chronic kidney disease) stage 3, GFR 30-59 ml/min   . COPD (chronic obstructive pulmonary disease) (HCC)   . DEGENERATIVE JOINT DISEASE, RIGHT HIP    s/p THR 11/2008  . GERD   . History of cardiovascular stress test 01/2015   Lexiscan Myoview 7/16:  Inferoseptal, inferior and apical ischemia, EF 67%, TID 1.09; intermediate risk  . Hypertension   . MIGRAINE HEADACHE   . Osteoarthritis of knee    s/p B TKR 07/2009  . RENAL INSUFFICIENCY     Past Surgical History:  Procedure Laterality Date  .  CARDIAC CATHETERIZATION N/A 02/15/2015   Procedure: Left Heart Cath and Coronary Angiography;  Surgeon: Lennette Bihari, MD;  Location: [facility] INVASIVE CV LAB;  Service: Cardiovascular;  Laterality: N/A;  . CATARACT EXTRACTION, BILATERAL  10/2012  . CHOLECYSTECTOMY N/A 06/14/2015   Procedure: LAPAROSCOPIC CHOLECYSTECTOMY WITH INTRAOPERATIVE CHOLANGIOGRAM;  Surgeon: Karie Soda, MD;  Location: [facility];  Service: General;  Laterality: N/A;  . INGUINAL HERNIA REPAIR Bilateral 1960's  . REPLACEMENT TOTAL KNEE BILATERAL  07/2009   graves  . TONSILLECTOMY    . TOTAL HIP ARTHROPLASTY  11/2008   Dr. Luiz Blare    Social History   Social History  . Marital status: Single    Spouse name: N/A  . Number of children: 0  . Years of education:  20   Occupational History  . Retired    Social History Main Topics  . Smoking status: Former Smoker    Quit date: 07/22/1962  . Smokeless tobacco: Never Used  . Alcohol use 0.0 oz/week     Comment: ocassionally  . Drug use: No  . Sexual activity: No   Other Topics Concern  . None   Social History Narrative   Single, lives alone. 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 Colon cancer Mother   . Lung cancer Father   . Alcohol abuse Other   . Arthritis Other   . Diabetes Other   . Hypertension Other   . Heart disease Other   . Heart attack Maternal Uncle   . Stroke Paternal Grandfather     Review of Systems  Constitutional: Negative for appetite change, chills and fever.  Respiratory: Positive for cough, shortness of breath (chronic, worse over last year) and wheezing (occasional).   Cardiovascular: Negative for chest pain and palpitations.  Gastrointestinal: Positive for diarrhea (today, intermittent only). Negative for abdominal pain, blood in stool, constipation and nausea.       No gerd  Genitourinary: Positive for difficulty urinating (weak stream  - chronic not change) and flank pain. Negative for dysuria, frequency and hematuria.  Musculoskeletal: Positive for gait problem (for the past 2-3 years).  Neurological: Positive for headaches (mild, occ). Negative for light-headedness.       Objective:   Vitals:   03/27/17 0958  BP: 104/64  Pulse: 65  Resp: 16  Temp: 98.3 F (36.8 C)  SpO2: 97%   Filed Weights   03/27/17 0958  Weight: 247 lb (112 kg)   Body mass index is 32.59 kg/m.  Wt Readings from Last 3 Encounters:  03/27/17 247 lb (112 kg)  06/17/16 250 lb (113.4 kg)  06/27/15 216 lb 12.8 oz (98.3 kg)     Physical Exam Constitutional: Appears well-developed and well-nourished. No distress.  HENT:  Head: Normocephalic and atraumatic.  Neck: Neck  supple. No tracheal deviation present. No thyromegaly present.  No cervical lymphadenopathy Cardiovascular: Normal rate, regular rhythm and normal heart sounds.   No murmur heard. No carotid bruit .  No edema Pulmonary/Chest: Effort normal and breath sounds normal. No respiratory distress. No has no wheezes. No rales.  Abdomen: soft, non tender, obese, but not distended Msk:  No lower back pain or lower back pain, no cva tenderness Skin: Skin is warm and dry. Not diaphoretic.  Psychiatric: Normal mood and affect. Behavior is normal.         Assessment & Plan:   See Problem List  for Assessment and Plan of chronic medical problems.

## 2017-03-27 ENCOUNTER — Other Ambulatory Visit (INDEPENDENT_AMBULATORY_CARE_PROVIDER_SITE_OTHER): Payer: Medicare HMO

## 2017-03-27 ENCOUNTER — Ambulatory Visit (INDEPENDENT_AMBULATORY_CARE_PROVIDER_SITE_OTHER): Payer: Medicare HMO | Admitting: Internal Medicine

## 2017-03-27 ENCOUNTER — Encounter: Payer: Self-pay | Admitting: Internal Medicine

## 2017-03-27 VITALS — BP 104/64 | HR 65 | Temp 98.3°F | Resp 16 | Wt 247.0 lb

## 2017-03-27 DIAGNOSIS — R109 Unspecified abdominal pain: Secondary | ICD-10-CM | POA: Insufficient documentation

## 2017-03-27 DIAGNOSIS — R1013 Epigastric pain: Secondary | ICD-10-CM

## 2017-03-27 LAB — URINALYSIS, ROUTINE W REFLEX MICROSCOPIC
BILIRUBIN URINE: NEGATIVE
HGB URINE DIPSTICK: NEGATIVE
KETONES UR: NEGATIVE
LEUKOCYTES UA: NEGATIVE
Nitrite: NEGATIVE
Specific Gravity, Urine: 1.025 (ref 1.000–1.030)
Total Protein, Urine: NEGATIVE
UROBILINOGEN UA: 0.2 (ref 0.0–1.0)
Urine Glucose: NEGATIVE
pH: 6 (ref 5.0–8.0)

## 2017-03-27 LAB — COMPREHENSIVE METABOLIC PANEL
ALBUMIN: 4.1 g/dL (ref 3.5–5.2)
ALT: 14 U/L (ref 0–53)
AST: 14 U/L (ref 0–37)
Alkaline Phosphatase: 52 U/L (ref 39–117)
BILIRUBIN TOTAL: 1.1 mg/dL (ref 0.2–1.2)
BUN: 15 mg/dL (ref 6–23)
CO2: 27 mEq/L (ref 19–32)
CREATININE: 1.69 mg/dL — AB (ref 0.40–1.50)
Calcium: 9.6 mg/dL (ref 8.4–10.5)
Chloride: 107 mEq/L (ref 96–112)
GFR: 42.24 mL/min — ABNORMAL LOW (ref >60.00)
Glucose, Bld: 90 mg/dL (ref 70–99)
Potassium: 4.4 mEq/L (ref 3.5–5.1)
Sodium: 141 mEq/L (ref 135–145)
TOTAL PROTEIN: 6.7 g/dL (ref 6.0–8.3)

## 2017-03-27 LAB — CBC WITH DIFFERENTIAL/PLATELET
BASOS ABS: 0.1 10*3/uL (ref 0.0–0.1)
Basophils Relative: 1.1 % (ref 0.0–3.0)
EOS ABS: 0.3 10*3/uL (ref 0.0–0.7)
Eosinophils Relative: 3.6 % (ref 0.0–5.0)
HEMATOCRIT: 46.7 % (ref 39.0–52.0)
HEMOGLOBIN: 15.4 g/dL (ref 13.0–17.0)
LYMPHS PCT: 30.2 % (ref 12.0–46.0)
Lymphs Abs: 2.5 10*3/uL (ref 0.7–4.0)
MCHC: 32.9 g/dL (ref 30.0–36.0)
MCV: 93.6 fl (ref 78.0–100.0)
Monocytes Absolute: 0.6 10*3/uL (ref 0.1–1.0)
Monocytes Relative: 7.1 % (ref 3.0–12.0)
Neutro Abs: 4.8 10*3/uL (ref 1.4–7.7)
Neutrophils Relative %: 58 % (ref 43.0–77.0)
PLATELETS: 188 10*3/uL (ref 150.0–400.0)
RBC: 4.99 Mil/uL (ref 4.22–5.81)
RDW: 14.4 % (ref 11.5–15.5)
WBC: 8.3 10*3/uL (ref 4.0–10.5)

## 2017-03-27 NOTE — Patient Instructions (Addendum)
  Test(s) ordered today. Your results will be released to MyChart (or called to you) after review, usually within 72hours after test completion. If any changes need to be made, you will be notified at that same time.   Medications reviewed and updated.  No changes recommended at this time.   A Ct scan was ordered and we will call you to schedule that.

## 2017-03-27 NOTE — Assessment & Plan Note (Signed)
Right sided x 6-8 weeks - getting worse - more intense and frequent - still mild in nature Has a history of kidney stones and we need to rule that out - may be msk, possible BPH causing obstruction/hydronephrosis, GI cause less likely Check UA, UCx, cbc, cmp CT with renal stone protocol

## 2017-03-28 LAB — URINE CULTURE
MICRO NUMBER:: 80979437
Result:: NO GROWTH
SPECIMEN QUALITY:: ADEQUATE

## 2017-04-01 ENCOUNTER — Ambulatory Visit (INDEPENDENT_AMBULATORY_CARE_PROVIDER_SITE_OTHER)
Admission: RE | Admit: 2017-04-01 | Discharge: 2017-04-01 | Disposition: A | Discharge status: Home / Self Care | Payer: Medicare HMO | Source: Ambulatory Visit | Attending: Internal Medicine | Admitting: Internal Medicine

## 2017-04-01 DIAGNOSIS — R109 Unspecified abdominal pain: Secondary | ICD-10-CM | POA: Diagnosis not present

## 2017-04-01 DIAGNOSIS — R1013 Epigastric pain: Secondary | ICD-10-CM | POA: Diagnosis not present

## 2017-04-03 ENCOUNTER — Encounter: Payer: Self-pay | Admitting: Internal Medicine

## 2017-04-03 DIAGNOSIS — K4090 Unilateral inguinal hernia, without obstruction or gangrene, not specified as recurrent: Secondary | ICD-10-CM | POA: Insufficient documentation

## 2017-04-03 DIAGNOSIS — I700 Atherosclerosis of aorta: Secondary | ICD-10-CM | POA: Insufficient documentation

## 2017-05-23 IMAGING — US US ABDOMEN COMPLETE
1 series · 13 of 25 positions shown · non-contrast
Comparison: None.

CLINICAL DATA: Right upper quadrant pain

EXAM:
ULTRASOUND ABDOMEN COMPLETE

[Series 1: us abdomen complete · 0.37mm/px · 13 of 86 slices shown]
[im 1/86]
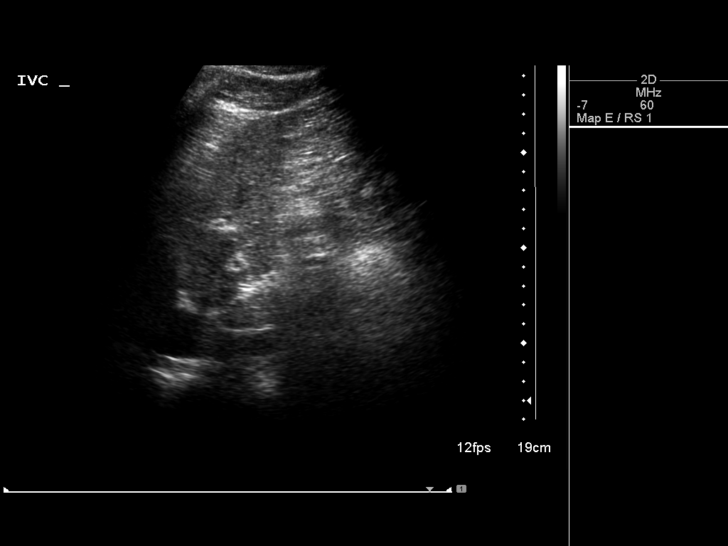
[im 8/86]
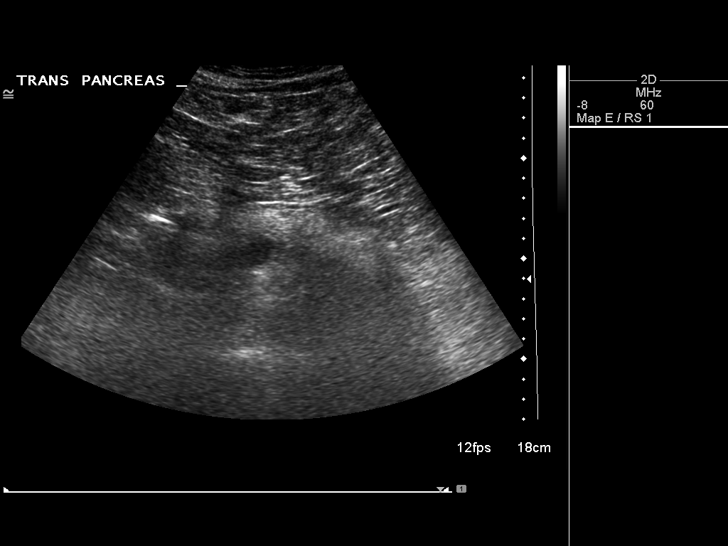
[im 15/86]
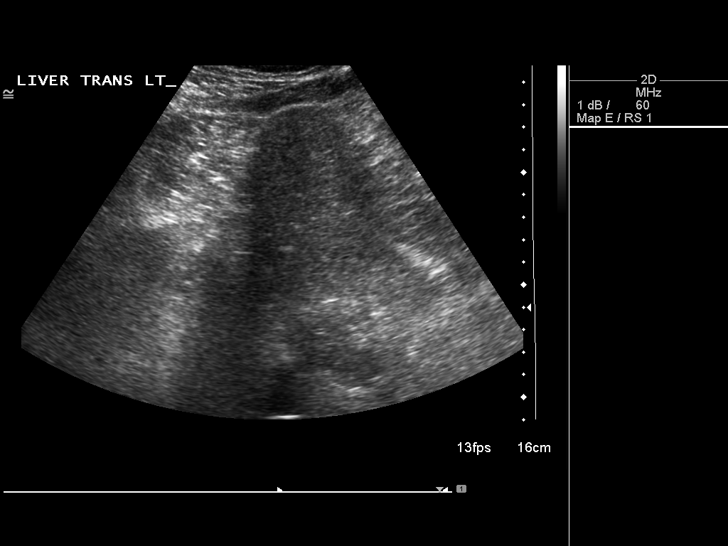
[im 22/86]
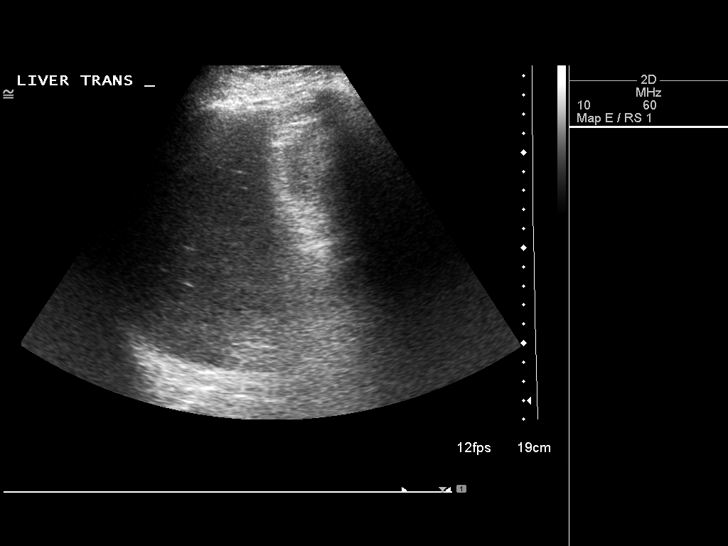
[im 29/86]
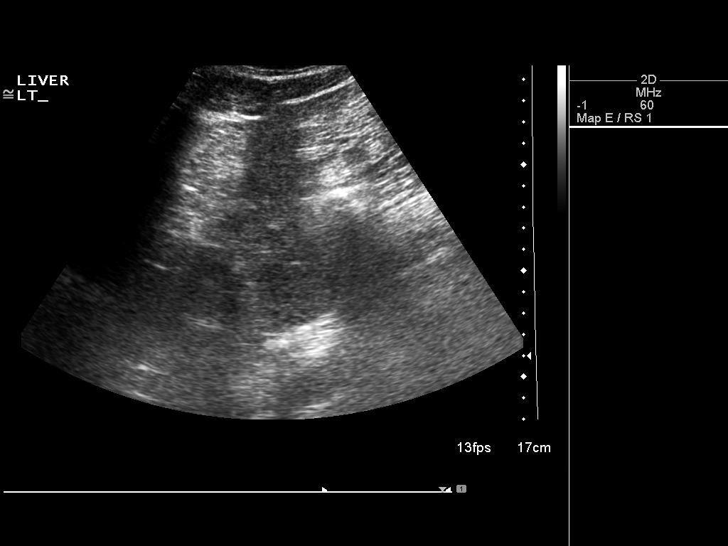
[im 36/86]
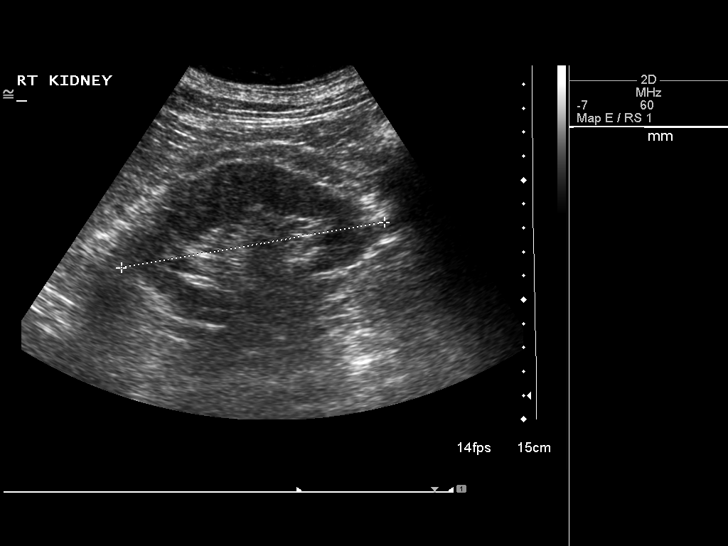
[im 43/86]
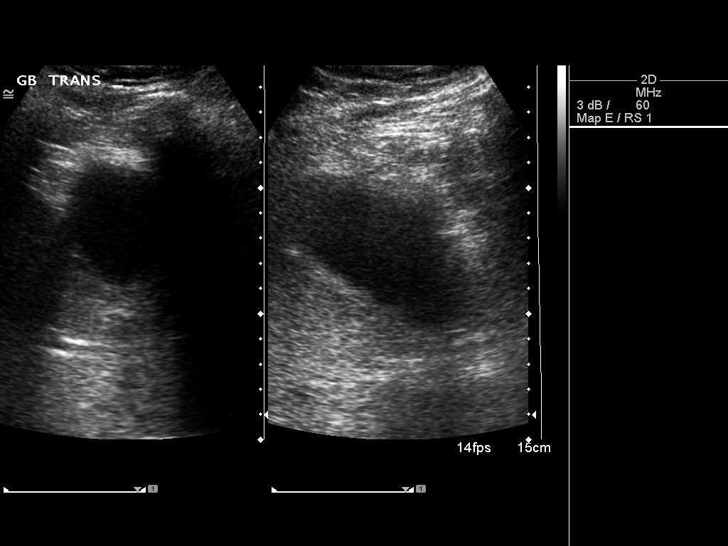
[im 50/86]
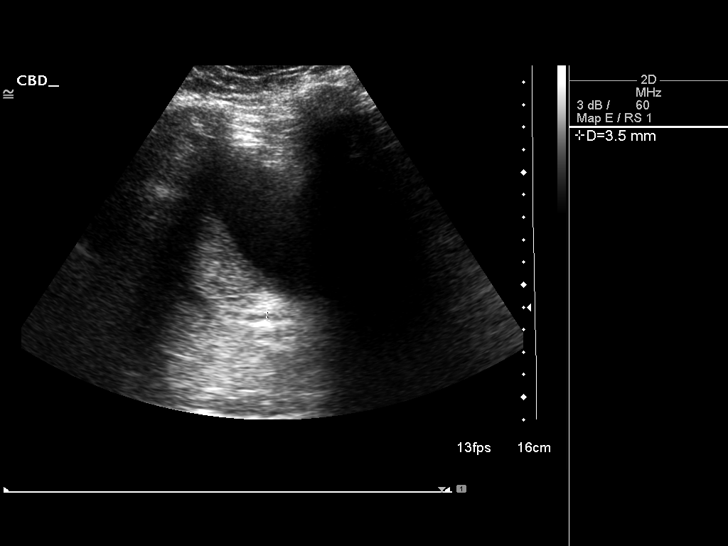
[im 57/86]
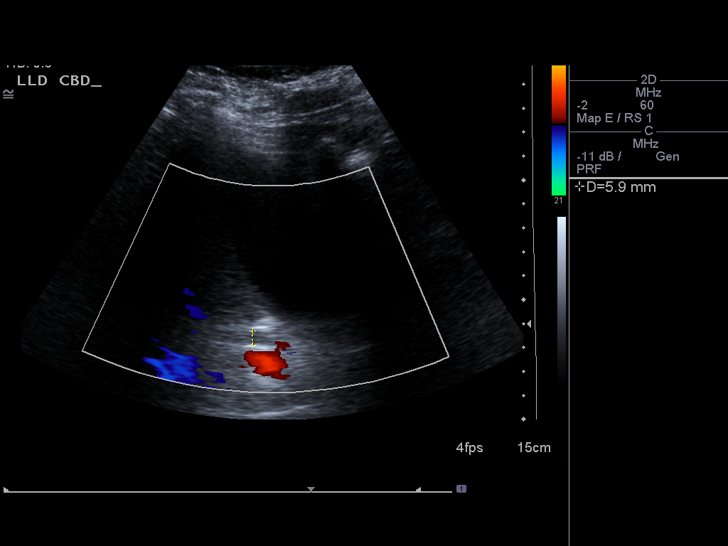
[im 64/86]
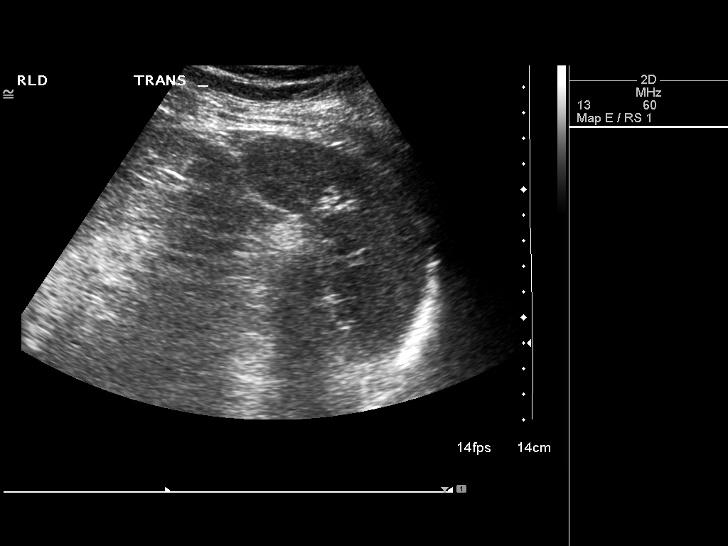
[im 71/86]
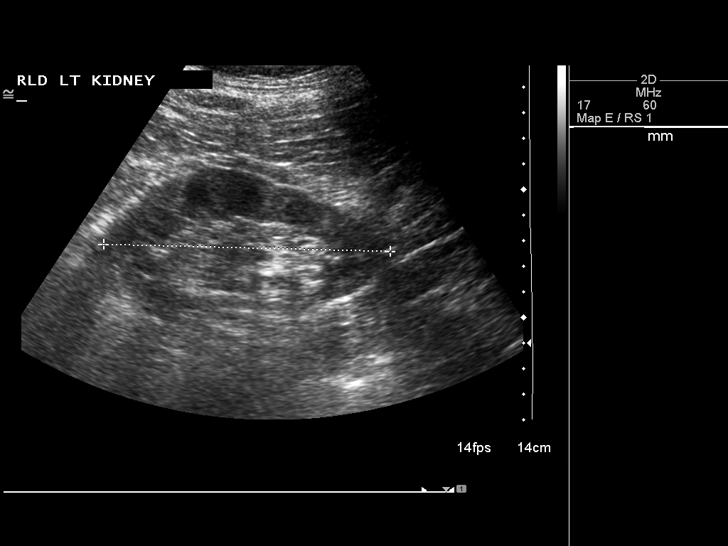
[im 78/86]
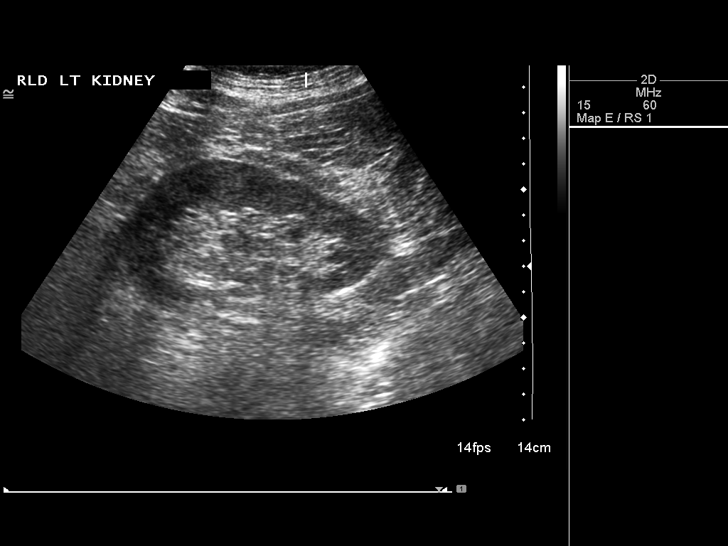
[im 86/86]
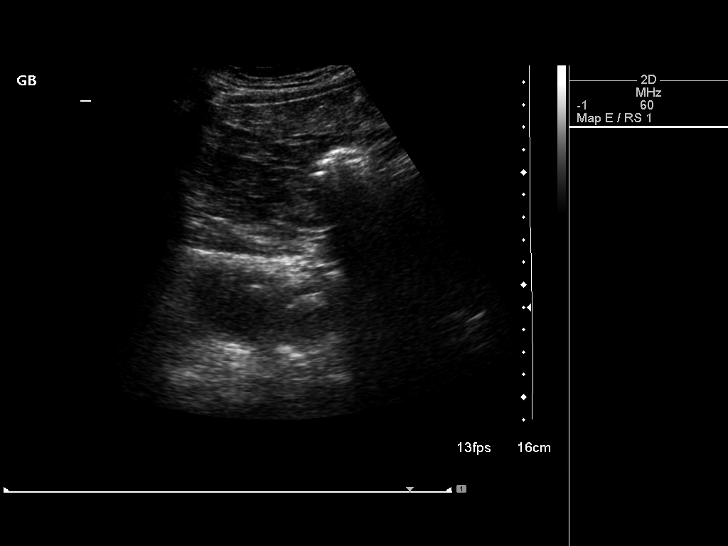

[13 of 25 positions shown; findings below may reference images not displayed]

FINDINGS: Gallbladder: There are 2 gallstones towards the neck of the
gallbladder. The larger measures 2.3 cm. Gallbladder wall is
thickened at 6 mm. Sonographic Murphy sign is positive.

Common bile duct: Diameter: 9 mm

Liver: Diffusely increased in echogenicity without focal mass. No
intrahepatic biliary dilatation.

IVC: No abnormality visualized.

Pancreas: No pancreatic head mass. The pancreatic duct is upper
normal in caliber at 3.7 mm.

Spleen: Size and appearance within normal limits.

Right Kidney: Length: 11.2 cm. Echogenicity within normal limits. No
mass or hydronephrosis visualized.

Left Kidney: Length: 11.2 cm. Echogenicity within normal limits. No
mass or hydronephrosis visualized.

Abdominal aorta: No aneurysm visualized.

Other findings: None.
IMPRESSION: Cholelithiasis

There is wall thickening in a positive sonographic Murphy sign
suggesting acute cholecystitis. Correlate clinically as for the need
for nuclear medicine imaging.

The common bile duct is dilated. Developing biliary obstruction is
not excluded. Correlate clinically as for the need for MRCP or ERCP.

Diffuse hepatic steatosis.

## 2017-05-25 IMAGING — RF DG CHOLANGIOGRAM OPERATIVE
1 series · 1 of 1 positions shown · non-contrast
Comparison: Ultrasound abdomen 06/12/2015

CLINICAL DATA: Right upper quadrant pain.  Cholecystectomy.

EXAM:
INTRAOPERATIVE CHOLANGIOGRAM
TECHNIQUE: Cholangiographic images from the C-arm fluoroscopic device were
submitted for interpretation post-operatively. Please see the
procedural report for the amount of contrast and the fluoroscopy
time utilized.

[Series 1: run · 1 of 1 slices shown]
[im 1/1]
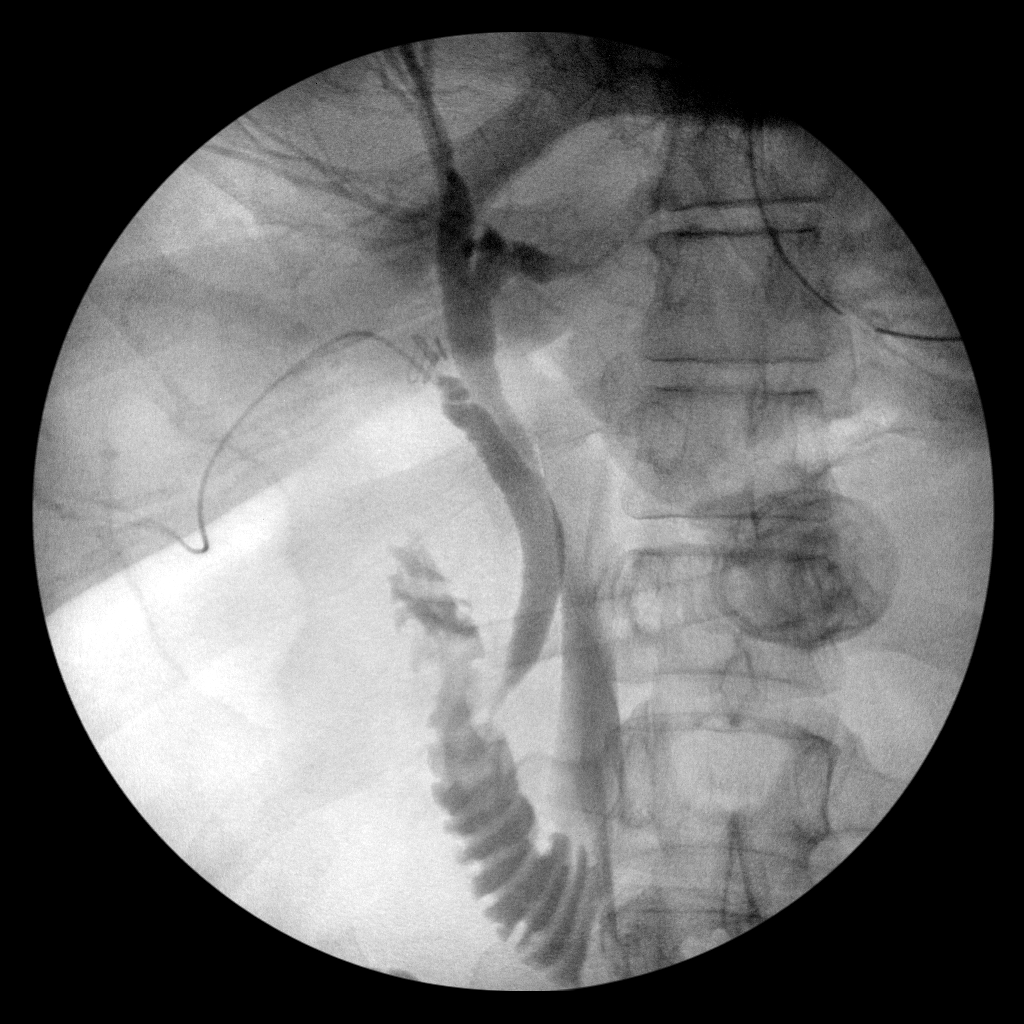

[1 of 1 positions shown; findings below may reference images not displayed]

FINDINGS: Intraoperative fluoroscopy is obtained with during injection of
contrast material into the bowel ducts. Fluoroscopy time is recorded
at 16 seconds. A single spot fluoroscopic images obtained.

Spot fluoroscopic image obtained of the right upper quadrant during
injection of contrast material into the cystic duct. Surgical clips
across the cystic duct. Intrahepatic ducts are not well visualized.
No significant extrahepatic bile duct dilatation. No intraluminal
filling defects demonstrated. Free flow of contrast material into
the duodenum suggesting no evidence of obstruction. No stricture
identified. Somewhat a amorphous density projected medial to the
bowel ducts could represent extraluminal contrast material.
IMPRESSION: No bile duct dilatation or common bile duct stones identified.

## 2017-06-18 ENCOUNTER — Encounter: Payer: Self-pay | Admitting: Internal Medicine

## 2017-06-18 DIAGNOSIS — R7303 Prediabetes: Secondary | ICD-10-CM | POA: Insufficient documentation

## 2017-06-18 NOTE — Patient Instructions (Addendum)
Test(s) ordered today. Your results will be released to MyChart (or called to you) after review, usually within 72hours after test completion. If any changes need to be made, you will be notified at that same time.  Flu immunization administered today.   Medications reviewed and updated.  Changes include increasing atorvastatin (lipitor) to 40 mg daily.  Your prescription(s) have been submitted to your pharmacy. Please take as directed and contact our office if you believe you are having problem(s) with the medication(s).  Please followup in 6 month  Continue doing brain stimulating activities (puzzles, reading, adult coloring books, staying active) to keep memory sharp.   Start to eat heart healthy diet (full of fruits, vegetables, whole grains, lean protein, water--limit salt, fat, and sugar intake) and increase physical activity as tolerated.   Mr. Preston Boyd , Thank you for taking time to come for your Medicare Wellness Visit. I appreciate your ongoing commitment to your health goals. Please review the following plan we discussed and let me know if I can assist you in the future.   These are the goals we discussed: Goals    . Patient Stated     Stay as healthy and as independent as possible. 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       This is a list of the screening recommended for you and due dates:  Health Maintenance  Topic Date Due  . Colon Cancer Screening  03/06/2021*  . Tetanus Vaccine  07/08/2019  . Flu Shot  Completed  . Pneumonia vaccines  Completed  *Topic was postponed. The date shown is not the original due date.

## 2017-06-18 NOTE — Progress Notes (Signed)
Subjective:    Patient ID: Preston Boyd, male    DOB: [birth year], 76 y.o.   MRN: [id number]  HPI He is here for a physical exam.    His hearing is worse.  He has had his hearing evaluated and can not afford his hearing aids.  He may try to get something otc to amplify his ears.    His balance is a little worse, but has not fallen.  He walks 3-4 miles a day with his dog.  [facility]  He does some stretching.    His feet hurt in the ball of the feet.   He thinks it is arthritis.  He has tried different shoes and pads.   He has not been able to lose weight.    Medications and allergies reviewed with patient and updated if appropriate.  Patient Active Problem List   Diagnosis Date Noted  . Hearing loss 06/19/2017  . Prediabetes 06/18/2017  . Aortic atherosclerosis (HCC) 04/03/2017  . Left inguinal hernia 04/03/2017  . Flank pain 03/27/2017  . CAD (coronary artery disease) 01/20/2015  . Hyperlipidemia 12/05/2014  . CKD (chronic kidney disease) stage 3, GFR 30-59 ml/min (HCC)   . COPD (chronic obstructive pulmonary disease) (HCC)   . MIGRAINE HEADACHE 07/07/2009  . Hypertension 07/07/2009  . Nodular hyperplasia of prostate gland 07/07/2009  . DEGENERATIVE JOINT DISEASE, RIGHT HIP 07/07/2009  . ARTHRITIS 07/07/2009    Current Outpatient Medications on File Prior to Visit  Medication Sig Dispense Refill  . acetaminophen (TYLENOL) 500 MG tablet Take 500 mg by mouth every 6 (six) hours as needed for moderate pain.     Marland Kitchen. aspirin EC 325 MG tablet Take 1 tablet (325 mg total) by mouth daily. 100 tablet 3  . atorvastatin (LIPITOR) 20 MG tablet Take 1 tablet (20 mg total) by mouth daily. 90 tablet 3  . Flaxseed, Linseed, (FLAX SEED OIL PO) Take 15 mLs by mouth daily.    . Fluticasone-Salmeterol (ADVAIR DISKUS) 250-50 MCG/DOSE AEPB Inhale 1 puff into the lungs 2 (two) times daily as needed (for shortness of breath). 60 each 3  . GuaiFENesin (MUCINEX PO)  Take 1 tablet by mouth as needed (CONGESTION).     Marland Kitchen. metoprolol tartrate (LOPRESSOR) 25 MG tablet Take 0.5 tablets (12.5 mg total) by mouth 2 (two) times daily. 30 tablet 5  . Olopatadine HCl (PAZEO) 0.7 % SOLN Place 1 drop into both eyes daily as needed (for dry eyes).    . Propylene Glycol (SYSTANE BALANCE OP) Place 1 drop into both eyes as needed (for dry eyes).     No current facility-administered medications on file prior to visit.     Past Medical History:  Diagnosis Date  . Acute cholecystitis s/p lap cholecystectomy 06/14/2015 06/13/2015  . BENIGN PROSTATIC HYPERTROPHY   . CAD (coronary artery disease) 01/2015   mild on cath 01/2015, med mgmt rec  . CKD (chronic kidney disease) stage 3, GFR 30-59 ml/min (HCC)   . COPD (chronic obstructive pulmonary disease) (HCC)   . DEGENERATIVE JOINT DISEASE, RIGHT HIP    s/p THR 11/2008  . GERD   . History of cardiovascular stress test 01/2015   Lexiscan Myoview 7/16:  Inferoseptal, inferior and apical ischemia, EF 67%, TID 1.09; intermediate risk  . Hypertension   . MIGRAINE HEADACHE   . Osteoarthritis of knee    s/p B TKR 07/2009  . RENAL INSUFFICIENCY     Past  Surgical History:  Procedure Laterality Date  . CARDIAC CATHETERIZATION N/A 02/15/2015   Procedure: Left Heart Cath and Coronary Angiography;  Surgeon: Lennette Bihari, MD;  Location: [facility] INVASIVE CV LAB;  Service: Cardiovascular;  Laterality: N/A;  . CATARACT EXTRACTION, BILATERAL  10/2012  . CHOLECYSTECTOMY N/A 06/14/2015   Procedure: LAPAROSCOPIC CHOLECYSTECTOMY WITH INTRAOPERATIVE CHOLANGIOGRAM;  Surgeon: Karie Soda, MD;  Location: [facility]S;  Service: General;  Laterality: N/A;  . INGUINAL HERNIA REPAIR Bilateral 1960's  . REPLACEMENT TOTAL KNEE BILATERAL  07/2009   graves  . TONSILLECTOMY    . TOTAL HIP ARTHROPLASTY  11/2008   Dr. Luiz Blare    Social History   Socioeconomic History  . Marital status: Single    Spouse name: None  . Number of children: 0  . Years of  education: 70  . Highest education level: None  Social Needs  . Financial resource strain: None  . Food insecurity - worry: None  . Food insecurity - inability: None  . Transportation needs - medical: None  . Transportation needs - non-medical: None  Occupational History  . Occupation: Retired  Tobacco Use  . Smoking status: Former Smoker    Last attempt to quit: 07/22/1962    Years since quitting: 54.9  . Smokeless tobacco: Never Used  Substance and Sexual Activity  . Alcohol use: Yes    Alcohol/week: 0.0 oz    Comment: ocassionally  . Drug use: No  . Sexual activity: No  Other Topics Concern  . None  Social History Narrative   Single, lives alone. 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 Colon cancer Mother   . Lung cancer Father   . Stroke Paternal Grandfather   . Alcohol abuse Other   . Arthritis Other   . Diabetes Other   . Hypertension Other   . Heart disease Other   . Heart attack Maternal Uncle     Review of Systems  Constitutional: Negative for chills, fatigue and fever.  HENT: Positive for hearing loss.   Eyes: Negative for visual disturbance.  Respiratory: Positive for cough, shortness of breath (intermittent) and wheezing (occasional).   Cardiovascular: Negative for chest pain, palpitations and leg swelling.  Gastrointestinal: Negative for abdominal pain, blood in stool, constipation, diarrhea and nausea.       Rare gerd  Genitourinary: Negative for dysuria and hematuria.  Musculoskeletal: Positive for arthralgias. Negative for back pain.  Skin: Negative for color change and rash.  Neurological: Positive for light-headedness (rare). Negative for dizziness and headaches.  Psychiatric/Behavioral: Negative for dysphoric mood. The patient is not nervous/anxious.        Objective:   Vitals:   06/19/17 0846  BP: 134/74  Pulse: 71  Resp: 16  Temp: 98 F  (36.7 C)  SpO2: (!) 71%   Filed Weights   06/19/17 0846  Weight: 249 lb (112.9 kg)   Body mass index is 32.85 kg/m.  Wt Readings from Last 3 Encounters:  06/19/17 249 lb (112.9 kg)  03/27/17 247 lb (112 kg)  06/17/16 250 lb (113.4 kg)     Physical Exam Constitutional: He appears well-developed and well-nourished. No distress.  HENT:  Head: Normocephalic and atraumatic.  Right Ear: External ear normal.  Left Ear: External ear normal.  Mouth/Throat: Oropharynx is clear and moist.  Normal ear canals, TM b/l dull Eyes: Conjunctivae and EOM are normal.  Neck: Neck  supple. No tracheal deviation present. No thyromegaly present.  No carotid bruit  Cardiovascular: Normal rate, regular rhythm, normal heart sounds and intact distal pulses.   No murmur heard. Pulmonary/Chest: Effort normal and breath sounds normal. No respiratory distress. He has no wheezes. He has no rales.  Abdominal: Soft. Ventral hernia - non tender, He exhibits no distension. There is no tenderness.  Genitourinary: deferred  Musculoskeletal: He exhibits no edema.  Lymphadenopathy:   He has no cervical adenopathy.  Skin: Skin is warm and dry. He is not diaphoretic. several hyperkeratosis on arms, neck, back Psychiatric: He has a normal mood and affect. His behavior is normal.         Assessment & Plan:     Physical exam: Screening blood work  ordered Immunizations  Discussed shingrix, others up to date Colonoscopy   Up to date  Eye exams  Up to date  EKG   Last given 05/2015 Exercise - regular - walks, goes to gym Weight advised weight loss Skin  No concerns Substance abuse   none  See Problem List for Assessment and Plan of chronic medical problems.  FU in 6 months

## 2017-06-19 ENCOUNTER — Other Ambulatory Visit (INDEPENDENT_AMBULATORY_CARE_PROVIDER_SITE_OTHER): Payer: Medicare HMO

## 2017-06-19 ENCOUNTER — Ambulatory Visit (INDEPENDENT_AMBULATORY_CARE_PROVIDER_SITE_OTHER): Payer: Medicare HMO | Admitting: Internal Medicine

## 2017-06-19 ENCOUNTER — Encounter: Payer: Self-pay | Admitting: Internal Medicine

## 2017-06-19 VITALS — BP 134/74 | HR 71 | Temp 98.0°F | Resp 16 | Ht 73.0 in | Wt 249.0 lb

## 2017-06-19 DIAGNOSIS — N183 Chronic kidney disease, stage 3 unspecified: Secondary | ICD-10-CM

## 2017-06-19 DIAGNOSIS — Z23 Encounter for immunization: Secondary | ICD-10-CM

## 2017-06-19 DIAGNOSIS — K4090 Unilateral inguinal hernia, without obstruction or gangrene, not specified as recurrent: Secondary | ICD-10-CM

## 2017-06-19 DIAGNOSIS — E7849 Other hyperlipidemia: Secondary | ICD-10-CM | POA: Diagnosis not present

## 2017-06-19 DIAGNOSIS — H9193 Unspecified hearing loss, bilateral: Secondary | ICD-10-CM

## 2017-06-19 DIAGNOSIS — I2510 Atherosclerotic heart disease of native coronary artery without angina pectoris: Secondary | ICD-10-CM

## 2017-06-19 DIAGNOSIS — I10 Essential (primary) hypertension: Secondary | ICD-10-CM

## 2017-06-19 DIAGNOSIS — K439 Ventral hernia without obstruction or gangrene: Secondary | ICD-10-CM

## 2017-06-19 DIAGNOSIS — R7303 Prediabetes: Secondary | ICD-10-CM | POA: Diagnosis not present

## 2017-06-19 DIAGNOSIS — I700 Atherosclerosis of aorta: Secondary | ICD-10-CM

## 2017-06-19 DIAGNOSIS — H9190 Unspecified hearing loss, unspecified ear: Secondary | ICD-10-CM | POA: Insufficient documentation

## 2017-06-19 DIAGNOSIS — Z0000 Encounter for general adult medical examination without abnormal findings: Secondary | ICD-10-CM

## 2017-06-19 DIAGNOSIS — J449 Chronic obstructive pulmonary disease, unspecified: Secondary | ICD-10-CM | POA: Diagnosis not present

## 2017-06-19 DIAGNOSIS — Z0001 Encounter for general adult medical examination with abnormal findings: Secondary | ICD-10-CM | POA: Diagnosis not present

## 2017-06-19 LAB — CBC WITH DIFFERENTIAL/PLATELET
BASOS PCT: 1.1 % (ref 0.0–3.0)
Basophils Absolute: 0.1 10*3/uL (ref 0.0–0.1)
EOS ABS: 0.4 10*3/uL (ref 0.0–0.7)
EOS PCT: 4.7 % (ref 0.0–5.0)
HCT: 54 % — ABNORMAL HIGH (ref 39.0–52.0)
Hemoglobin: 17.7 g/dL — ABNORMAL HIGH (ref 13.0–17.0)
LYMPHS PCT: 32.1 % (ref 12.0–46.0)
Lymphs Abs: 2.7 10*3/uL (ref 0.7–4.0)
MCHC: 32.7 g/dL (ref 30.0–36.0)
MCV: 93.8 fl (ref 78.0–100.0)
MONOS PCT: 6.8 % (ref 3.0–12.0)
Monocytes Absolute: 0.6 10*3/uL (ref 0.1–1.0)
NEUTROS ABS: 4.7 10*3/uL (ref 1.4–7.7)
Neutrophils Relative %: 55.3 % (ref 43.0–77.0)
PLATELETS: 193 10*3/uL (ref 150.0–400.0)
RBC: 5.76 Mil/uL (ref 4.22–5.81)
RDW: 14.5 % (ref 11.5–15.5)
WBC: 8.5 10*3/uL (ref 4.0–10.5)

## 2017-06-19 LAB — LIPID PANEL
Cholesterol: 119 mg/dL (ref 0–200)
HDL: 39.1 mg/dL (ref >39.00)
LDL Cholesterol: 56 mg/dL (ref 0–99)
NONHDL: 79.55
Total CHOL/HDL Ratio: 3
Triglycerides: 118 mg/dL (ref 0.0–149.0)
VLDL: 23.6 mg/dL (ref 0.0–40.0)

## 2017-06-19 LAB — COMPREHENSIVE METABOLIC PANEL
ALBUMIN: 4.4 g/dL (ref 3.5–5.2)
ALK PHOS: 62 U/L (ref 39–117)
ALT: 19 U/L (ref 0–53)
AST: 18 U/L (ref 0–37)
BILIRUBIN TOTAL: 1 mg/dL (ref 0.2–1.2)
BUN: 21 mg/dL (ref 6–23)
CO2: 27 mEq/L (ref 19–32)
Calcium: 10 mg/dL (ref 8.4–10.5)
Chloride: 105 mEq/L (ref 96–112)
Creatinine, Ser: 1.65 mg/dL — ABNORMAL HIGH (ref 0.40–1.50)
GFR: 43.4 mL/min — AB (ref >60.00)
GLUCOSE: 115 mg/dL — AB (ref 70–99)
POTASSIUM: 5.2 meq/L — AB (ref 3.5–5.1)
SODIUM: 140 meq/L (ref 135–145)
Total Protein: 7.6 g/dL (ref 6.0–8.3)

## 2017-06-19 LAB — TSH: TSH: 2.16 u[IU]/mL (ref 0.35–4.50)

## 2017-06-19 MED ORDER — METOPROLOL TARTRATE 25 MG PO TABS: 12.5000 mg | ORAL_TABLET | Freq: Two times a day (BID) | ORAL | 3 refills | Status: DC

## 2017-06-19 MED ORDER — ATORVASTATIN CALCIUM 40 MG PO TABS: 40.0000 mg | ORAL_TABLET | Freq: Every day | ORAL | 3 refills | Status: DC

## 2017-06-19 NOTE — Assessment & Plan Note (Signed)
Check lipid panel  Continue daily statin - increase to 40 mg given CAD and LDL last few years being elevated > 70 Regular exercise and healthy diet encouraged

## 2017-06-19 NOTE — Assessment & Plan Note (Signed)
Significant, unable to afford hearing aids

## 2017-06-19 NOTE — Assessment & Plan Note (Signed)
Seen on ct Will increase atorvastatin to 40 mg daily

## 2017-06-19 NOTE — Assessment & Plan Note (Signed)
Check a1c Low sugar / carb diet Stressed regular exercise, weight loss  

## 2017-06-19 NOTE — Assessment & Plan Note (Signed)
asymptomatic

## 2017-06-19 NOTE — Assessment & Plan Note (Signed)
BP well controlled Current regimen effective and well tolerated Continue current medications at current doses cmp  

## 2017-06-19 NOTE — Progress Notes (Signed)
Subjective:   Preston Boyd is a 76 y.o. male who presents for Medicare Annual/Subsequent preventive examination.  Review of Systems:  No ROS.  Medicare Wellness Visit. Additional risk factors are reflected in the social history.  Cardiac Risk Factors include: hypertension;advanced age (>39men, >53 women);male gender;obesity (BMI >30kg/m2) Sleep patterns: gets up 2-3 times nightly to void and sleeps 6-7 hours nightly.   Home Safety/Smoke Alarms: Feels safe in home. Smoke alarms in place.  Living environment; residence and Firearm Safety: 1-story house/ trailer, no firearms. Lives with alone, no needs for DME, good support system Seat Belt Safety/Bike Helmet: Wears seat belt.     Objective:    Vitals: BP 134/74   Pulse 71   Temp 98 F (36.7 C) (Oral)   Resp 16   Ht 6\' 1"  (1.854 m)   Wt 249 lb (112.9 kg)   SpO2 (!) 71%   BMI 32.85 kg/m   Body mass index is 32.85 kg/m.  Tobacco Social History   Tobacco Use  Smoking Status Former Smoker  . Last attempt to quit: 07/22/1962  . Years since quitting: 54.9  Smokeless Tobacco Never Used     Counseling given: Not Answered   Past Medical History:  Diagnosis Date  . Acute cholecystitis s/p lap cholecystectomy 06/14/2015 06/13/2015  . BENIGN PROSTATIC HYPERTROPHY   . CAD (coronary artery disease) 01/2015   mild on cath 01/2015, med mgmt rec  . CKD (chronic kidney disease) stage 3, GFR 30-59 ml/min (HCC)   . COPD (chronic obstructive pulmonary disease) (HCC)   . DEGENERATIVE JOINT DISEASE, RIGHT HIP    s/p THR 11/2008  . GERD   . History of cardiovascular stress test 01/2015   Lexiscan Myoview 7/16:  Inferoseptal, inferior and apical ischemia, EF 67%, TID 1.09; intermediate risk  . Hypertension   . MIGRAINE HEADACHE   . Osteoarthritis of knee    s/p B TKR 07/2009  . RENAL INSUFFICIENCY    Past Surgical History:  Procedure Laterality Date  . CARDIAC CATHETERIZATION N/A 02/15/2015   Procedure: Left Heart Cath and Coronary  Angiography;  Surgeon: Lennette Bihari, MD;  Location: [facility] INVASIVE CV LAB;  Service: Cardiovascular;  Laterality: N/A;  . CATARACT EXTRACTION, BILATERAL  10/2012  . CHOLECYSTECTOMY N/A 06/14/2015   Procedure: LAPAROSCOPIC CHOLECYSTECTOMY WITH INTRAOPERATIVE CHOLANGIOGRAM;  Surgeon: Karie Soda, MD;  Location: [facility] ORS;  Service: General;  Laterality: N/A;  . INGUINAL HERNIA REPAIR Bilateral 1960's  . REPLACEMENT TOTAL KNEE BILATERAL  07/2009   graves  . TONSILLECTOMY    . TOTAL HIP ARTHROPLASTY  11/2008   Dr. Luiz Blare   Family History  Problem Relation Age of Onset  . Colon cancer Mother   . Lung cancer Father   . Stroke Paternal Grandfather   . Alcohol abuse Other   . Arthritis Other   . Diabetes Other   . Hypertension Other   . Heart disease Other   . Heart attack Maternal Uncle    Social History   Substance and Sexual Activity  Sexual Activity No    Outpatient Encounter Medications as of 06/19/2017  Medication Sig  . acetaminophen (TYLENOL) 500 MG tablet Take 500 mg by mouth every 6 (six) hours as needed for moderate pain.   Marland Kitchen aspirin EC 325 MG tablet Take 1 tablet (325 mg total) by mouth daily.  . Flaxseed, Linseed, (FLAX SEED OIL PO) Take 15 mLs by mouth daily.  . Fluticasone-Salmeterol (ADVAIR DISKUS) 250-50 MCG/DOSE AEPB Inhale 1 puff into the  lungs 2 (two) times daily as needed (for shortness of breath).  . GuaiFENesin (MUCINEX PO) Take 1 tablet by mouth as needed (CONGESTION).   Marland Kitchen. metoprolol tartrate (LOPRESSOR) 25 MG tablet Take 0.5 tablets (12.5 mg total) by mouth 2 (two) times daily.  . Olopatadine HCl (PAZEO) 0.7 % SOLN Place 1 drop into both eyes daily as needed (for dry eyes).  . Propylene Glycol (SYSTANE BALANCE OP) Place 1 drop into both eyes as needed (for dry eyes).  . [DISCONTINUED] atorvastatin (LIPITOR) 20 MG tablet Take 1 tablet (20 mg total) by mouth daily.  . [DISCONTINUED] metoprolol tartrate (LOPRESSOR) 25 MG tablet Take 0.5 tablets (12.5 mg total) by mouth  2 (two) times daily.  Marland Kitchen. atorvastatin (LIPITOR) 40 MG tablet Take 1 tablet (40 mg total) by mouth daily.   No facility-administered encounter medications on file as of 06/19/2017.     Activities of Daily Living In your present state of health, do you have any difficulty performing the following activities: 06/19/2017  Hearing? Y  Vision? N  Difficulty concentrating or making decisions? N  Walking or climbing stairs? N  Dressing or bathing? N  Doing errands, shopping? N  Preparing Food and eating ? N  Using the Toilet? N  In the past six months, have you accidently leaked urine? N  Do you have problems with loss of bowel control? N  Managing your Medications? N  Managing your Finances? N  Housekeeping or managing your Housekeeping? N  Some recent data might be hidden    Patient Care Team: Pincus SanesBurns, Stacy J, MD as PCP - General (Internal Medicine) Jodi GeraldsGraves, John, MD as Consulting Physician (Orthopedic Surgery) Estrella DeedsYoakum, John, OD as Physician Assistant (Optometry) Smith MillsKimbrough, [facility] Lowella BandyM Jr., MD as Consulting Physician (Urology) Lennette BihariKelly, Thomas A, MD (Cardiology) Charlott RakesSchooler, Vincent, MD as Consulting Physician (Gastroenterology)   Assessment:    Physical assessment deferred to PCP.  Exercise Activities and Dietary recommendations Current Exercise Habits: Structured exercise class, Type of exercise: walking;strength training/weights;treadmill(walks dog daily and [facility]), Time (Minutes): 55, Frequency (Times/Week): 6, Weekly Exercise (Minutes/Week): 330, Intensity: Mild, Exercise limited by: orthopedic condition(s)  Diet (meal preparation, eat out, water intake, caffeinated beverages, dairy products, fruits and vegetables): in general, a "healthy" diet  , patient states he does not monitor diet for low sugar, low carbohydrate and heart healthy diet.   Reviewed heart healthy and diabetic diet, encouraged patient to increase daily water intake.   Goals    . Patient Stated       Stay as healthy and as independent as possible. 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      Fall Risk Fall Risk  06/19/2017 03/27/2017 12/01/2014 05/18/2013  Falls in the past year? No No No No   Depression Screen PHQ 2/9 Scores 06/19/2017 06/17/2016 12/01/2014 05/18/2013  PHQ - 2 Score 0 0 0 1    Cognitive Function MMSE - Mini Mental State Exam 06/19/2017  Orientation to time 5  Orientation to Place 5  Registration 3  Attention/ Calculation 4  Recall 2  Language- name 2 objects 2  Language- repeat 1  Language- follow 3 step command 3  Language- read & follow direction 1  Write a sentence 1  Copy design 1  Total score 28        Immunization History  Administered Date(s) Administered  . Influenza Split 05/21/2012  . Influenza Whole 07/07/2009  . Influenza, High Dose Seasonal PF 05/18/2013, 06/12/2015, 06/17/2016, 06/19/2017  .  Influenza,inj,Quad PF,6+ Mos 05/19/2014  . Pneumococcal Conjugate-13 05/19/2014  . Pneumococcal Polysaccharide-23 07/18/2016  . Td 07/07/2009  . Zoster 03/07/2011   Screening Tests Health Maintenance  Topic Date Due  . COLONOSCOPY  03/06/2021 (Originally 02/04/1992)  . TETANUS/TDAP  07/08/2019  . INFLUENZA VACCINE  Completed  . PNA vac Low Risk Adult  Completed      Plan:     Continue doing brain stimulating activities (puzzles, reading, adult coloring books, staying active) to keep memory sharp.   Start to eat heart healthy diet (full of fruits, vegetables, whole grains, lean protein, water--limit salt, fat, and sugar intake) and increase physical activity as tolerated.  I have personally reviewed and noted the following in the patient's chart:   . Medical and social history . Use of alcohol, tobacco or illicit drugs  . Current medications and supplements . Functional ability and status . Nutritional status . Physical activity . Advanced directives . List of other physicians . Vitals . Screenings to  include cognitive, depression, and falls . Referrals and appointments  In addition, I have reviewed and discussed with patient certain preventive protocols, quality metrics, and best practice recommendations. A written personalized care plan for preventive services as well as general preventive health recommendations were provided to patient.     Wanda PlumpJill A Wine, RN  06/19/2017    Medical screening examination/treatment/procedure(s) were performed by non-physician practitioner and as supervising physician I was immediately available for consultation/collaboration. I agree with above. Pincus SanesStacy J Burns, MD

## 2017-06-19 NOTE — Assessment & Plan Note (Signed)
S/p b/l repair years ago asymptomatic

## 2017-06-19 NOTE — Assessment & Plan Note (Addendum)
Uses advair prn  - probably needs it daily - has been using it more often Has occ wheeze, cough is more frequent and has some SOB advair daily- prn

## 2017-06-19 NOTE — Assessment & Plan Note (Signed)
No chest pain, palps Taking ASA Will increase statin to 40 mg  Work on weight loss Continue regular exercise

## 2017-06-19 NOTE — Assessment & Plan Note (Signed)
cmp

## 2017-06-20 ENCOUNTER — Encounter: Payer: Self-pay | Admitting: Emergency Medicine

## 2017-06-21 ENCOUNTER — Other Ambulatory Visit: Payer: Self-pay | Admitting: Internal Medicine

## 2017-11-10 ENCOUNTER — Ambulatory Visit (INDEPENDENT_AMBULATORY_CARE_PROVIDER_SITE_OTHER): Payer: Medicare HMO | Admitting: Family

## 2017-11-10 ENCOUNTER — Encounter: Payer: Self-pay | Admitting: Family

## 2017-11-10 VITALS — BP 124/82 | HR 70 | Temp 98.2°F | Ht 73.0 in | Wt 250.1 lb

## 2017-11-10 DIAGNOSIS — L309 Dermatitis, unspecified: Secondary | ICD-10-CM | POA: Diagnosis not present

## 2017-11-10 DIAGNOSIS — W57XXXA Bitten or stung by nonvenomous insect and other nonvenomous arthropods, initial encounter: Secondary | ICD-10-CM

## 2017-11-10 MED ORDER — DOXYCYCLINE HYCLATE 100 MG PO TABS: 100.0000 mg | ORAL_TABLET | Freq: Two times a day (BID) | ORAL | 0 refills | Status: DC

## 2017-11-10 NOTE — Patient Instructions (Signed)

## 2017-11-10 NOTE — Progress Notes (Signed)
Preston Boyd is a 77 y.o. male with the following history as recorded in EpicCare:  Patient Active Problem List   Diagnosis Date Noted  . Hearing loss 06/19/2017  . Ventral hernia 06/19/2017  . Prediabetes 06/18/2017  . Aortic atherosclerosis (HCC) 04/03/2017  . Left inguinal hernia 04/03/2017  . CAD (coronary artery disease) 01/20/2015  . Hyperlipidemia 12/05/2014  . CKD (chronic kidney disease) stage 3, GFR 30-59 ml/min (HCC)   . COPD (chronic obstructive pulmonary disease) (HCC)   . MIGRAINE HEADACHE 07/07/2009  . Hypertension 07/07/2009  . Nodular hyperplasia of prostate gland 07/07/2009  . DEGENERATIVE JOINT DISEASE, RIGHT HIP 07/07/2009  . ARTHRITIS 07/07/2009    Current Outpatient Medications  Medication Sig Dispense Refill  . acetaminophen (TYLENOL) 500 MG tablet Take 500 mg by mouth every 6 (six) hours as needed for moderate pain.     Marland Kitchen aspirin EC 325 MG tablet Take 1 tablet (325 mg total) by mouth daily. 100 tablet 3  . atorvastatin (LIPITOR) 40 MG tablet Take 1 tablet (40 mg total) by mouth daily. 90 tablet 3  . Flaxseed, Linseed, (FLAX SEED OIL PO) Take 15 mLs by mouth daily.    . Fluticasone-Salmeterol (ADVAIR DISKUS) 250-50 MCG/DOSE AEPB Inhale 1 puff into the lungs 2 (two) times daily as needed (for shortness of breath). 60 each 3  . GuaiFENesin (MUCINEX PO) Take 1 tablet by mouth as needed (CONGESTION).     Marland Kitchen metoprolol tartrate (LOPRESSOR) 25 MG tablet Take 0.5 tablets (12.5 mg total) by mouth 2 (two) times daily. 90 tablet 3  . Olopatadine HCl (PAZEO) 0.7 % SOLN Place 1 drop into both eyes daily as needed (for dry eyes).    . Propylene Glycol (SYSTANE BALANCE OP) Place 1 drop into both eyes as needed (for dry eyes).    Marland Kitchen doxycycline (VIBRA-TABS) 100 MG tablet Take 1 tablet (100 mg total) by mouth 2 (two) times daily. 14 tablet 0   No current facility-administered medications for this visit.     Allergies: Amoxicillin and Other  Past Medical History:   Diagnosis Date  . Acute cholecystitis s/p lap cholecystectomy 06/14/2015 06/13/2015  . BENIGN PROSTATIC HYPERTROPHY   . CAD (coronary artery disease) 01/2015   mild on cath 01/2015, med mgmt rec  . CKD (chronic kidney disease) stage 3, GFR 30-59 ml/min (HCC)   . COPD (chronic obstructive pulmonary disease) (HCC)   . DEGENERATIVE JOINT DISEASE, RIGHT HIP    s/p THR 11/2008  . GERD   . History of cardiovascular stress test 01/2015   Lexiscan Myoview 7/16:  Inferoseptal, inferior and apical ischemia, EF 67%, TID 1.09; intermediate risk  . Hypertension   . MIGRAINE HEADACHE   . Osteoarthritis of knee    s/p B TKR 07/2009  . RENAL INSUFFICIENCY     Past Surgical History:  Procedure Laterality Date  . CARDIAC CATHETERIZATION N/A 02/15/2015   Procedure: Left Heart Cath and Coronary Angiography;  Surgeon: Lennette Bihari, MD;  Location[facility] INVASIVE CV LAB;  Service: Cardiovascular;  Laterality: N/A;  . CATARACT EXTRACTION, BILATERAL  10/2012  . CHOLECYSTECTOMY N/A 06/14/2015   Procedure: LAPAROSCOPIC CHOLECYSTECTOMY WITH INTRAOPERATIVE CHOLANGIOGRAM;  Surgeon: Karie Soda, MD;  Location: [facility] ORS;  Service: General;  Laterality: N/A;  . INGUINAL HERNIA REPAIR Bilateral 1960's  . REPLACEMENT TOTAL KNEE BILATERAL  07/2009   graves  . TONSILLECTOMY    . TOTAL HIP ARTHROPLASTY  11/2008   Dr. Luiz Blare    Family History  Problem Relation Age  of Onset  . Colon cancer Mother   . Lung cancer Father   . Stroke Paternal Grandfather   . Alcohol abuse Other   . Arthritis Other   . Diabetes Other   . Hypertension Other   . Heart disease Other   . Heart attack Maternal Uncle     Social History   Tobacco Use  . Smoking status: Former Smoker    Last attempt to quit: 07/22/1962    Years since quitting: 55.3  . Smokeless tobacco: Never Used  Substance Use Topics  . Alcohol use: Yes    Alcohol/week: 0.0 oz    Comment: ocassionally    Subjective:  Patient pulled a tick off himself- pulled off his  upper chest on Friday evening; denies any fever or rash; no body aches; read online that needed to get checked out just to be safe; os requesting treatment based on what he read online;   Objective:  Vitals:   11/10/17 1455  BP: 124/82  Pulse: 70  Temp: 98.2 F (36.8 C)  TempSrc: Oral  SpO2: 97%  Weight: 250 lb 1.9 oz (113.5 kg)  Height: 6\' 1"  (1.854 m)    General: Well developed, well nourished, in no acute distress  Skin : Warm and dry.  Head: Normocephalic and atraumatic  Eyes: Sclera and conjunctiva clear; pupils round and reactive to light; extraocular movements intact  Ears: External normal; canals clear; tympanic membranes normal  Oropharynx: Pink, supple. No suspicious lesions  Neck: Supple without thyromegaly, adenopathy  Lungs: Respirations unlabored; clear to auscultation bilaterally without wheeze, rales, rhonchi  Neurologic: Alert and oriented; speech intact; face symmetrical; moves all extremities well; CNII-XII intact without focal deficit  Assessment:  1. Dermatitis   2. Tick bite, initial encounter     Plan:  Will treat with Doxycycline due to his concern about appearance of skin where he pulled tick off- offered reassurance that feel is localized reaction to the tick however; Discussed signs/ symptoms of Lyme Disease/ RMSF and he understands to call back if develops rash, fever, body aches, headache; follow-up worse, no better;   No follow-ups on file.  No orders of the defined types were placed in this encounter.   Requested Prescriptions   Signed Prescriptions Disp Refills  . doxycycline (VIBRA-TABS) 100 MG tablet 14 tablet 0    Sig: Take 1 tablet (100 mg total) by mouth 2 (two) times daily.

## 2017-12-17 NOTE — Patient Instructions (Addendum)
  Test(s) ordered today. Your results will be released to MyChart (or called to you) after review, usually within 72hours after test completion. If any changes need to be made, you will be notified at that same time.   Medications reviewed and updated.  No changes recommended at this time.   A kidney and bladder ultrasound was ordered - someone will call you to schedule this.     Please followup in 6 months as scheduled

## 2017-12-17 NOTE — Progress Notes (Signed)
Subjective:    Patient ID: Preston Boyd, male    DOB: [birth year], 77 y.o.   MRN: [id number]  HPI The patient is here for follow up.  CAD, Hypertension: He is taking his medication daily. He is compliant with a low sodium diet.  He denies chest pain, palpitations, edema and regular headaches. He is exercising regularly.     Hyperlipidemia: He is taking his medication daily. He is compliant with a low fat/cholesterol diet. He is exercising regularly. He denies myalgias.   Prediabetes:  He is not compliant with a low sugar/carbohydrate diet.  He is [facility] - [facility]  CKD:  He is drinking a good amount of water, but probably not enough - maybe 40oz.  He does not take any nsaids other than his aspirin.    COPD:  He ran out of his advair and has been using his older inhaler, but has not been using as much.    Right flank / side pain:  It started last fall and it has gotten worse.  A CT scan for renal stones showed no abnormalities.  It is worse at night and after he eats a lot.  He denies pain with moving around.  He denies GERD, except on a rare occasion.  He denies any association with his BMs.  Medications and allergies reviewed with patient and updated if appropriate.  Patient Active Problem List   Diagnosis Date Noted  . Hearing loss 06/19/2017  . Ventral hernia 06/19/2017  . Prediabetes 06/18/2017  . Aortic atherosclerosis (HCC) 04/03/2017  . Left inguinal hernia 04/03/2017  . CAD (coronary artery disease) 01/20/2015  . Hyperlipidemia 12/05/2014  . CKD (chronic kidney disease) stage 3, GFR 30-59 ml/min (HCC)   . COPD (chronic obstructive pulmonary disease) (HCC)   . MIGRAINE HEADACHE 07/07/2009  . Hypertension 07/07/2009  . Nodular hyperplasia of prostate gland 07/07/2009  . DEGENERATIVE JOINT DISEASE, RIGHT HIP 07/07/2009  . ARTHRITIS 07/07/2009    Current Outpatient Medications on File Prior to Visit  Medication Sig Dispense Refill  .  acetaminophen (TYLENOL) 500 MG tablet Take 500 mg by mouth every 6 (six) hours as needed for moderate pain.     Marland Kitchen aspirin EC 325 MG tablet Take 1 tablet (325 mg total) by mouth daily. 100 tablet 3  . atorvastatin (LIPITOR) 40 MG tablet Take 1 tablet (40 mg total) by mouth daily. 90 tablet 3  . Flaxseed, Linseed, (FLAX SEED OIL PO) Take 15 mLs by mouth daily.    . GuaiFENesin (MUCINEX PO) Take 1 tablet by mouth as needed (CONGESTION).     Marland Kitchen metoprolol tartrate (LOPRESSOR) 25 MG tablet Take 0.5 tablets (12.5 mg total) by mouth 2 (two) times daily. 90 tablet 3  . Olopatadine HCl (PAZEO) 0.7 % SOLN Place 1 drop into both eyes daily as needed (for dry eyes).    . Propylene Glycol (SYSTANE BALANCE OP) Place 1 drop into both eyes as needed (for dry eyes).     No current facility-administered medications on file prior to visit.     Past Medical History:  Diagnosis Date  . Acute cholecystitis s/p lap cholecystectomy 06/14/2015 06/13/2015  . BENIGN PROSTATIC HYPERTROPHY   . CAD (coronary artery disease) 01/2015   mild on cath 01/2015, med mgmt rec  . CKD (chronic kidney disease) stage 3, GFR 30-59 ml/min (HCC)   . COPD (chronic obstructive pulmonary disease) (HCC)   . DEGENERATIVE JOINT DISEASE, RIGHT HIP    s/p  THR 11/2008  . GERD   . History of cardiovascular stress test 01/2015   Lexiscan Myoview 7/16:  Inferoseptal, inferior and apical ischemia, EF 67%, TID 1.09; intermediate risk  . Hypertension   . MIGRAINE HEADACHE   . Osteoarthritis of knee    s/p B TKR 07/2009  . RENAL INSUFFICIENCY     Past Surgical History:  Procedure Laterality Date  . CARDIAC CATHETERIZATION N/A 02/15/2015   Procedure: Left Heart Cath and Coronary Angiography;  Surgeon: Lennette Bihari, MD;  Location: [facility] INVASIVE CV LAB;  Service: Cardiovascular;  Laterality: N/A;  . CATARACT EXTRACTION, BILATERAL  10/2012  . CHOLECYSTECTOMY N/A 06/14/2015   Procedure: LAPAROSCOPIC CHOLECYSTECTOMY WITH INTRAOPERATIVE CHOLANGIOGRAM;   Surgeon: Karie Soda, MD;  Location: [facility] ORS;  Service: General;  Laterality: N/A;  . INGUINAL HERNIA REPAIR Bilateral 1960's  . REPLACEMENT TOTAL KNEE BILATERAL  07/2009   graves  . TONSILLECTOMY    . TOTAL HIP ARTHROPLASTY  11/2008   Dr. Luiz Blare    Social History   Socioeconomic History  . Marital status: Single    Spouse name: Not on file  . Number of children: 0  . Years of education: 55  . Highest education level: Not on file  Occupational History  . Occupation: Retired  Engineer, production  . Financial resource strain: Not on file  . Food insecurity:    Worry: Not on file    Inability: Not on file  . Transportation needs:    Medical: Not on file    Non-medical: Not on file  Tobacco Use  . Smoking status: Former Smoker    Last attempt to quit: 07/22/1962    Years since quitting: 55.4  . Smokeless tobacco: Never Used  Substance and Sexual Activity  . Alcohol use: Yes    Alcohol/week: 0.0 oz    Comment: ocassionally  . Drug use: No  . Sexual activity: Never  Lifestyle  . Physical activity:    Days per week: Not on file    Minutes per session: Not on file  . Stress: Not on file  Relationships  . Social connections:    Talks on phone: Not on file    Gets together: Not on file    Attends religious service: Not on file    Active member of club or organization: Not on file    Attends meetings of clubs or organizations: Not on file    Relationship status: Not on file  Other Topics Concern  . Not on file  Social History Narrative   Single, lives alone. 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 Colon cancer Mother   . Lung cancer Father   . Stroke Paternal Grandfather   . Alcohol abuse Other   . Arthritis Other   . Diabetes Other   . Hypertension Other   . Heart disease Other   . Heart attack Maternal Uncle     Review of Systems  Constitutional: Positive for appetite  change (decreased x 2 weeks - ? heat). Negative for chills and fever.  Respiratory: Positive for cough (dry ), shortness of breath and wheezing (at night, occasional).   Cardiovascular: Negative for chest pain, palpitations and leg swelling.  Gastrointestinal: Positive for constipation (occasional) and diarrhea (about once a month). Negative for abdominal pain, blood in stool and nausea.       Rare gerd  Genitourinary: Positive for  difficulty urinating. Negative for dysuria and hematuria.  Neurological: Positive for dizziness (occ). Negative for light-headedness and headaches.       Objective:   Vitals:   12/18/17 0847  BP: 134/72  Pulse: 67  Resp: 16  Temp: 98.4 F (36.9 C)  SpO2: 97%   BP Readings from Last 3 Encounters:  12/18/17 134/72  11/10/17 124/82  06/19/17 134/74   Wt Readings from Last 3 Encounters:  12/18/17 247 lb (112 kg)  11/10/17 250 lb 1.9 oz (113.5 kg)  06/19/17 249 lb (112.9 kg)   Body mass index is 32.59 kg/m.   Physical Exam    Constitutional: Appears well-developed and well-nourished. No distress.  HENT:  Head: Normocephalic and atraumatic.  Neck: Neck supple. No tracheal deviation present. No thyromegaly present.  No cervical lymphadenopathy Cardiovascular: Normal rate, regular rhythm and normal heart sounds.   No murmur heard. No carotid bruit .  No edema Pulmonary/Chest: Effort normal and breath sounds normal. No respiratory distress. No has no wheezes. No rales.  Abdomen:  Obese, soft, ventral hernia - reducible, minimal RLQ tenderness w/o rebound or guarding. No flank pain with palpation.   Skin: Skin is warm and dry. Not diaphoretic.  Psychiatric: Normal mood and affect. Behavior is normal.      Assessment & Plan:    See Problem List for Assessment and Plan of chronic medical problems.

## 2017-12-18 ENCOUNTER — Ambulatory Visit (INDEPENDENT_AMBULATORY_CARE_PROVIDER_SITE_OTHER): Payer: Medicare HMO | Admitting: Internal Medicine

## 2017-12-18 ENCOUNTER — Encounter: Payer: Self-pay | Admitting: Internal Medicine

## 2017-12-18 ENCOUNTER — Other Ambulatory Visit (INDEPENDENT_AMBULATORY_CARE_PROVIDER_SITE_OTHER): Payer: Medicare HMO

## 2017-12-18 VITALS — BP 134/72 | HR 67 | Temp 98.4°F | Resp 16 | Wt 247.0 lb

## 2017-12-18 DIAGNOSIS — R7303 Prediabetes: Secondary | ICD-10-CM

## 2017-12-18 DIAGNOSIS — I2510 Atherosclerotic heart disease of native coronary artery without angina pectoris: Secondary | ICD-10-CM

## 2017-12-18 DIAGNOSIS — N183 Chronic kidney disease, stage 3 unspecified: Secondary | ICD-10-CM

## 2017-12-18 DIAGNOSIS — R109 Unspecified abdominal pain: Secondary | ICD-10-CM

## 2017-12-18 DIAGNOSIS — E7849 Other hyperlipidemia: Secondary | ICD-10-CM

## 2017-12-18 DIAGNOSIS — G8929 Other chronic pain: Secondary | ICD-10-CM | POA: Diagnosis not present

## 2017-12-18 DIAGNOSIS — I10 Essential (primary) hypertension: Secondary | ICD-10-CM | POA: Diagnosis not present

## 2017-12-18 DIAGNOSIS — J449 Chronic obstructive pulmonary disease, unspecified: Secondary | ICD-10-CM

## 2017-12-18 LAB — CBC WITH DIFFERENTIAL/PLATELET
BASOS PCT: 1.1 % (ref 0.0–3.0)
Basophils Absolute: 0.1 10*3/uL (ref 0.0–0.1)
EOS PCT: 3.7 % (ref 0.0–5.0)
Eosinophils Absolute: 0.3 10*3/uL (ref 0.0–0.7)
HCT: 49 % (ref 39.0–52.0)
Hemoglobin: 16.7 g/dL (ref 13.0–17.0)
LYMPHS ABS: 2 10*3/uL (ref 0.7–4.0)
Lymphocytes Relative: 25.7 % (ref 12.0–46.0)
MCHC: 34.1 g/dL (ref 30.0–36.0)
MCV: 91.3 fl (ref 78.0–100.0)
MONO ABS: 0.5 10*3/uL (ref 0.1–1.0)
Monocytes Relative: 6 % (ref 3.0–12.0)
NEUTROS PCT: 63.5 % (ref 43.0–77.0)
Neutro Abs: 4.9 10*3/uL (ref 1.4–7.7)
Platelets: 172 10*3/uL (ref 150.0–400.0)
RBC: 5.37 Mil/uL (ref 4.22–5.81)
RDW: 14.5 % (ref 11.5–15.5)
WBC: 7.7 10*3/uL (ref 4.0–10.5)

## 2017-12-18 LAB — COMPREHENSIVE METABOLIC PANEL
ALBUMIN: 4.2 g/dL (ref 3.5–5.2)
ALK PHOS: 61 U/L (ref 39–117)
ALT: 16 U/L (ref 0–53)
AST: 15 U/L (ref 0–37)
BUN: 16 mg/dL (ref 6–23)
CO2: 23 mEq/L (ref 19–32)
Calcium: 9.4 mg/dL (ref 8.4–10.5)
Chloride: 107 mEq/L (ref 96–112)
Creatinine, Ser: 1.72 mg/dL — ABNORMAL HIGH (ref 0.40–1.50)
GFR: 41.31 mL/min — ABNORMAL LOW (ref >60.00)
GLUCOSE: 115 mg/dL — AB (ref 70–99)
POTASSIUM: 4.1 meq/L (ref 3.5–5.1)
SODIUM: 139 meq/L (ref 135–145)
TOTAL PROTEIN: 7.1 g/dL (ref 6.0–8.3)
Total Bilirubin: 0.8 mg/dL (ref 0.2–1.2)

## 2017-12-18 LAB — LIPID PANEL
Cholesterol: 100 mg/dL (ref 0–200)
HDL: 38.7 mg/dL — AB (ref >39.00)
LDL Cholesterol: 41 mg/dL (ref 0–99)
NONHDL: 61.23
TRIGLYCERIDES: 102 mg/dL (ref 0.0–149.0)
Total CHOL/HDL Ratio: 3
VLDL: 20.4 mg/dL (ref 0.0–40.0)

## 2017-12-18 LAB — HEMOGLOBIN A1C: HEMOGLOBIN A1C: 6.1 % (ref 4.6–6.5)

## 2017-12-18 MED ORDER — FLUTICASONE-SALMETEROL 250-50 MCG/DOSE IN AEPB: 1.0000 | INHALATION_SPRAY | Freq: Two times a day (BID) | RESPIRATORY_TRACT | 11 refills | Status: DC | PRN

## 2017-12-18 NOTE — Assessment & Plan Note (Signed)
Check a1c Low sugar / carb diet Stressed regular exercise, weight loss  

## 2017-12-18 NOTE — Assessment & Plan Note (Signed)
SOB, cough and wheeze at night Did not get advair refilled and has not been using it as often - refilled today for one year Symptoms better controlled with regular advair use

## 2017-12-18 NOTE — Assessment & Plan Note (Signed)
Drinking a good amount of fluids, no nsaids other than asa Cmp, cbc

## 2017-12-18 NOTE — Assessment & Plan Note (Signed)
Started in fall 2018 - has gotten worse Worse at night and if he eats too much Has had intermittent difficulty urinating - possibly prostate related H/o of renal stones Ct renal - neg last fall Check [place] of kidneys/bladder If negative - may need to see urology Avoids eating too much

## 2017-12-18 NOTE — Assessment & Plan Note (Signed)
BP well controlled Current regimen effective and well tolerated Continue current medications at current doses cmp  

## 2017-12-18 NOTE — Assessment & Plan Note (Signed)
Check lipid panel  Continue daily statin Regular exercise and healthy diet encouraged  

## 2017-12-18 NOTE — Assessment & Plan Note (Signed)
No chest pain, palpitations, edema Some SOB likely related to his COPD Continue regular exercise Continue current meds

## 2017-12-31 ENCOUNTER — Ambulatory Visit
Admission: RE | Admit: 2017-12-31 | Discharge: 2017-12-31 | Disposition: A | Discharge status: Home / Self Care | Payer: Medicare HMO | Source: Ambulatory Visit | Attending: Internal Medicine | Admitting: Internal Medicine

## 2017-12-31 DIAGNOSIS — G8929 Other chronic pain: Secondary | ICD-10-CM | POA: Diagnosis not present

## 2017-12-31 DIAGNOSIS — R109 Unspecified abdominal pain: Principal | ICD-10-CM

## 2018-01-13 ENCOUNTER — Telehealth: Payer: Self-pay | Admitting: Emergency Medicine

## 2018-01-13 MED ORDER — BUDESONIDE-FORMOTEROL FUMARATE 160-4.5 MCG/ACT IN AERO: 2.0000 | INHALATION_SPRAY | Freq: Two times a day (BID) | RESPIRATORY_TRACT | 3 refills | Status: DC

## 2018-01-13 NOTE — Telephone Encounter (Signed)
Received PA for Advair, unable to completed. Please advise on alternative.

## 2018-01-13 NOTE — Telephone Encounter (Signed)
LVM informing pt

## 2018-01-13 NOTE — Telephone Encounter (Signed)
We can try symbicort. - sent to [facility]

## 2018-01-20 DIAGNOSIS — H04123 Dry eye syndrome of bilateral lacrimal glands: Secondary | ICD-10-CM | POA: Diagnosis not present

## 2018-01-20 DIAGNOSIS — Z961 Presence of intraocular lens: Secondary | ICD-10-CM | POA: Diagnosis not present

## 2018-01-20 DIAGNOSIS — H26493 Other secondary cataract, bilateral: Secondary | ICD-10-CM | POA: Diagnosis not present

## 2018-06-16 ENCOUNTER — Other Ambulatory Visit: Payer: Self-pay | Admitting: Internal Medicine

## 2018-06-22 NOTE — Progress Notes (Signed)
Subjective:    Patient ID: Preston Boyd, male    DOB: 1941/02/16, 77 y.o.   MRN: [id number]  HPI He is here for a physical exam.     Medications and allergies reviewed with patient and updated if appropriate.  Patient Active Problem List   Diagnosis Date Noted  . Chronic right flank pain 12/18/2017  . Hearing loss 06/19/2017  . Ventral hernia 06/19/2017  . Prediabetes 06/18/2017  . Aortic atherosclerosis (HCC) 04/03/2017  . Left inguinal hernia 04/03/2017  . CAD (coronary artery disease) 01/20/2015  . Hyperlipidemia 12/05/2014  . CKD (chronic kidney disease) stage 3, GFR 30-59 ml/min (HCC)   . COPD (chronic obstructive pulmonary disease) (HCC)   . MIGRAINE HEADACHE 07/07/2009  . Hypertension 07/07/2009  . Nodular hyperplasia of prostate gland 07/07/2009  . DEGENERATIVE JOINT DISEASE, RIGHT HIP 07/07/2009  . ARTHRITIS 07/07/2009    Current Outpatient Medications on File Prior to Visit  Medication Sig Dispense Refill  . acetaminophen (TYLENOL) 500 MG tablet Take 500 mg by mouth every 6 (six) hours as needed for moderate pain.     Marland Kitchen aspirin EC 325 MG tablet Take 1 tablet (325 mg total) by mouth daily. 100 tablet 3  . atorvastatin (LIPITOR) 40 MG tablet TAKE ONE TABLET BY MOUTH DAILY 90 tablet 2  . budesonide-formoterol (SYMBICORT) 160-4.5 MCG/ACT inhaler Inhale 2 puffs into the lungs 2 (two) times daily. 1 Inhaler 3  . Flaxseed, Linseed, (FLAX SEED OIL PO) Take 15 mLs by mouth daily.    . GuaiFENesin (MUCINEX PO) Take 1 tablet by mouth as needed (CONGESTION).     Marland Kitchen metoprolol tartrate (LOPRESSOR) 25 MG tablet Take 0.5 tablets (12.5 mg total) by mouth 2 (two) times daily. 90 tablet 3  . Olopatadine HCl (PAZEO) 0.7 % SOLN Place 1 drop into both eyes daily as needed (for dry eyes).    . Propylene Glycol (SYSTANE BALANCE OP) Place 1 drop into both eyes as needed (for dry eyes).     No current facility-administered medications on file prior to visit.     Past Medical  History:  Diagnosis Date  . Acute cholecystitis s/p lap cholecystectomy 06/14/2015 06/13/2015  . BENIGN PROSTATIC HYPERTROPHY   . CAD (coronary artery disease) 01/2015   mild on cath 01/2015, med mgmt rec  . CKD (chronic kidney disease) stage 3, GFR 30-59 ml/min (HCC)   . COPD (chronic obstructive pulmonary disease) (HCC)   . DEGENERATIVE JOINT DISEASE, RIGHT HIP    s/p THR 11/2008  . GERD   . History of cardiovascular stress test 01/2015   Lexiscan Myoview 7/16:  Inferoseptal, inferior and apical ischemia, EF 67%, TID 1.09; intermediate risk  . Hypertension   . MIGRAINE HEADACHE   . Osteoarthritis of knee    s/p B TKR 07/2009  . RENAL INSUFFICIENCY     Past Surgical History:  Procedure Laterality Date  . CARDIAC CATHETERIZATION N/A 02/15/2015   Procedure: Left Heart Cath and Coronary Angiography;  Surgeon: Lennette Bihari, MD;  Location: [facility] INVASIVE CV LAB;  Service: Cardiovascular;  Laterality: N/A;  . CATARACT EXTRACTION, BILATERAL  10/2012  . CHOLECYSTECTOMY N/A 06/14/2015   Procedure: LAPAROSCOPIC CHOLECYSTECTOMY WITH INTRAOPERATIVE CHOLANGIOGRAM;  Surgeon: Karie Soda, MD;  Location: [facility] ORS;  Service: General;  Laterality: N/A;  . INGUINAL HERNIA REPAIR Bilateral 1960's  . REPLACEMENT TOTAL KNEE BILATERAL  07/2009   graves  . TONSILLECTOMY    . TOTAL HIP ARTHROPLASTY  11/2008   Dr. Luiz Blare  Social History   Socioeconomic History  . Marital status: Single    Spouse name: Not on file  . Number of children: 0  . Years of education: 2316  . Highest education level: Not on file  Occupational History  . Occupation: Retired  Engineer, productionocial Needs  . Financial resource strain: Not on file  . Food insecurity:    Worry: Not on file    Inability: Not on file  . Transportation needs:    Medical: Not on file    Non-medical: Not on file  Tobacco Use  . Smoking status: Former Smoker    Last attempt to quit: 07/22/1962    Years since quitting: 55.9  . Smokeless tobacco: Never Used    Substance and Sexual Activity  . Alcohol use: Yes    Alcohol/week: 0.0 standard drinks    Comment: ocassionally  . Drug use: No  . Sexual activity: Never  Lifestyle  . Physical activity:    Days per week: Not on file    Minutes per session: Not on file  . Stress: Not on file  Relationships  . Social connections:    Talks on phone: Not on file    Gets together: Not on file    Attends religious service: Not on file    Active member of club or organization: Not on file    Attends meetings of clubs or organizations: Not on file    Relationship status: Not on file  Other Topics Concern  . Not on file  Social History Narrative   Single, lives alone. 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 Colon cancer Mother   . Lung cancer Father   . Stroke Paternal Grandfather   . Alcohol abuse Other   . Arthritis Other   . Diabetes Other   . Hypertension Other   . Heart disease Other   . Heart attack Maternal Uncle     Review of Systems  Constitutional: Negative for chills and fever.  HENT: Positive for hearing loss.   Eyes: Negative for visual disturbance.  Respiratory: Positive for shortness of breath (chronic with exertion, unchanged) and wheezing (occasional). Negative for cough.   Cardiovascular: Negative for chest pain, palpitations and leg swelling.  Gastrointestinal: Positive for constipation (occ) and diarrhea (occ). Negative for abdominal pain, blood in stool and nausea.       Occ gerd  Genitourinary: Negative for dysuria and hematuria.       Weak stream  Musculoskeletal: Positive for arthralgias (shoulders and foot pain, hand pain), back pain and gait problem (poor balance).  Skin: Negative for color change and rash.  Neurological: Negative for dizziness, light-headedness and headaches.  Psychiatric/Behavioral: Negative for dysphoric mood. The patient is not nervous/anxious.         Objective:   Vitals:   06/23/18 0900  BP: 128/76  Pulse: (!) 58  Resp: 18  Temp: 98.6 F (37 C)  SpO2: 98%   Filed Weights   06/23/18 0900  Weight: 236 lb (107 kg)   Body mass index is 31.14 kg/m.  Wt Readings from Last 3 Encounters:  06/23/18 236 lb (107 kg)  12/18/17 247 lb (112 kg)  11/10/17 250 lb 1.9 oz (113.5 kg)     Physical Exam Constitutional: He appears well-developed and well-nourished. No distress.  HENT:  Head: Normocephalic and atraumatic.  Right Ear: External ear normal.  Left Ear:  External ear normal.  Mouth/Throat: Oropharynx is clear and moist.  Normal ear canals and TM b/l  Eyes: Conjunctivae and EOM are normal.  Neck: Neck supple. No tracheal deviation present. No thyromegaly present.  No carotid bruit  Cardiovascular: Normal rate, regular rhythm, normal heart sounds and intact distal pulses.   No murmur heard. Pulmonary/Chest: Effort normal and breath sounds normal. No respiratory distress. He has no wheezes. He has no rales.  Abdominal: Soft. Ventral hernia - non tender, reducible.  He exhibits no distension. There is no tenderness.  Genitourinary: deferred  Musculoskeletal: He exhibits no edema.  Lymphadenopathy:   He has no cervical adenopathy.  Skin: Skin is warm and dry. He is not diaphoretic.  Psychiatric: He has a normal mood and affect. His behavior is normal.         Assessment & Plan:   Physical exam: Screening blood work  ordered Immunizations   Flu vaccine today, discussed shingrix Colonoscopy  Never had one - does not want one Eye exams   Up to date  EKG   Last done 2016 Exercise   Gym 3 days a week Weight  He has lost weight - cutting back on [place] Skin   no concerns Substance abuse   none  See Problem List for Assessment and Plan of chronic medical problems.   FU in  6 months

## 2018-06-22 NOTE — Patient Instructions (Addendum)
Tests ordered today. Your results will be released to MyChart (or called to you) after review, usually within 72hours after test completion. If any changes need to be made, you will be notified at that same time.  All other Health Maintenance issues reviewed.   All recommended immunizations and age-appropriate screenings are up-to-date or discussed.  Flu immunization administered today.    Medications reviewed and updated.  Changes include :   none   Please followup in 6 months   Health Maintenance, Male A healthy lifestyle and preventive care is important for your health and wellness. Ask your health care provider about what schedule of regular examinations is right for you. What should I know about weight and diet? Eat a Healthy Diet  Eat plenty of vegetables, fruits, whole grains, low-fat dairy products, and lean protein.  Do not eat a lot of foods high in solid fats, added sugars, or salt.  Maintain a Healthy Weight Regular exercise can help you achieve or maintain a healthy weight. You should:  Do at least 150 minutes of exercise each week. The exercise should increase your heart rate and make you sweat (moderate-intensity exercise).  Do strength-training exercises at least twice a week.  Watch Your Levels of Cholesterol and Blood Lipids  Have your blood tested for lipids and cholesterol every 5 years starting at 76 years of age. If you are at high risk for heart disease, you should start having your blood tested when you are 76 years old. You may need to have your cholesterol levels checked more often if: ? Your lipid or cholesterol levels are high. ? You are older than 76 years of age. ? You are at high risk for heart disease.  What should I know about cancer screening? Many types of cancers can be detected early and may often be prevented. Lung Cancer  You should be screened every year for lung cancer if: ? You are a current smoker who has smoked for at least 30  years. ? You are a former smoker who has quit within the past 15 years.  Talk to your health care provider about your screening options, when you should start screening, and how often you should be screened.  Colorectal Cancer  Routine colorectal cancer screening usually begins at 76 years of age and should be repeated every 5-10 years until you are 75 years old. You may need to be screened more often if early forms of precancerous polyps or small growths are found. Your health care provider may recommend screening at an earlier age if you have risk factors for colon cancer.  Your health care provider may recommend using home test kits to check for hidden blood in the stool.  A small camera at the end of a tube can be used to examine your colon (sigmoidoscopy or colonoscopy). This checks for the earliest forms of colorectal cancer.  Prostate and Testicular Cancer  Depending on your age and overall health, your health care provider may do certain tests to screen for prostate and testicular cancer.  Talk to your health care provider about any symptoms or concerns you have about testicular or prostate cancer.  Skin Cancer  Check your skin from head to toe regularly.  Tell your health care provider about any new moles or changes in moles, especially if: ? There is a change in a mole's size, shape, or color. ? You have a mole that is larger than a pencil eraser.  Always use sunscreen. Apply sunscreen liberally   and repeat throughout the day.  Protect yourself by wearing long sleeves, pants, a wide-brimmed hat, and sunglasses when outside.  What should I know about heart disease, diabetes, and high blood pressure?  If you are 18-39 years of age, have your blood pressure checked every 3-5 years. If you are 40 years of age or older, have your blood pressure checked every year. You should have your blood pressure measured twice-once when you are at a hospital or clinic, and once when you are  not at a hospital or clinic. Record the average of the two measurements. To check your blood pressure when you are not at a hospital or clinic, you can use: ? An automated blood pressure machine at a pharmacy. ? A home blood pressure monitor.  Talk to your health care provider about your target blood pressure.  If you are between 45-79 years old, ask your health care provider if you should take aspirin to prevent heart disease.  Have regular diabetes screenings by checking your fasting blood sugar level. ? If you are at a normal weight and have a low risk for diabetes, have this test once every three years after the age of 45. ? If you are overweight and have a high risk for diabetes, consider being tested at a younger age or more often.  A one-time screening for abdominal aortic aneurysm (AAA) by ultrasound is recommended for men aged 65-75 years who are current or former smokers. What should I know about preventing infection? Hepatitis B If you have a higher risk for hepatitis B, you should be screened for this virus. Talk with your health care provider to find out if you are at risk for hepatitis B infection. Hepatitis C Blood testing is recommended for:  Everyone born from 1945 through 1965.  Anyone with known risk factors for hepatitis C.  Sexually Transmitted Diseases (STDs)  You should be screened each year for STDs including gonorrhea and chlamydia if: ? You are sexually active and are younger than 76 years of age. ? You are older than 76 years of age and your health care provider tells you that you are at risk for this type of infection. ? Your sexual activity has changed since you were last screened and you are at an increased risk for chlamydia or gonorrhea. Ask your health care provider if you are at risk.  Talk with your health care provider about whether you are at high risk of being infected with HIV. Your health care provider may recommend a prescription medicine to help  prevent HIV infection.  What else can I do?  Schedule regular health, dental, and eye exams.  Stay current with your vaccines (immunizations).  Do not use any tobacco products, such as cigarettes, chewing tobacco, and e-cigarettes. If you need help quitting, ask your health care provider.  Limit alcohol intake to no more than 2 drinks per day. One drink equals 12 ounces of beer, 5 ounces of wine, or 1 ounces of hard liquor.  Do not use street drugs.  Do not share needles.  Ask your health care provider for help if you need support or information about quitting drugs.  Tell your health care provider if you often feel depressed.  Tell your health care provider if you have ever been abused or do not feel safe at home. This information is not intended to replace advice given to you by your health care provider. Make sure you discuss any questions you have with your health   care provider. Document Released: 01/04/2008 Document Revised: 03/06/2016 Document Reviewed: 04/11/2015 Elsevier Interactive Patient Education  2018 [employer] Inc[employer]  

## 2018-06-23 ENCOUNTER — Ambulatory Visit (INDEPENDENT_AMBULATORY_CARE_PROVIDER_SITE_OTHER): Payer: Medicare HMO | Admitting: *Deleted

## 2018-06-23 ENCOUNTER — Other Ambulatory Visit (INDEPENDENT_AMBULATORY_CARE_PROVIDER_SITE_OTHER): Payer: Medicare HMO

## 2018-06-23 ENCOUNTER — Encounter: Payer: Self-pay | Admitting: Internal Medicine

## 2018-06-23 ENCOUNTER — Ambulatory Visit (INDEPENDENT_AMBULATORY_CARE_PROVIDER_SITE_OTHER): Payer: Medicare HMO | Admitting: Internal Medicine

## 2018-06-23 VITALS — BP 128/76 | HR 58 | Resp 18 | Ht 73.0 in | Wt 236.0 lb

## 2018-06-23 VITALS — BP 128/76 | HR 58 | Temp 98.6°F | Resp 18 | Ht 73.0 in | Wt 236.0 lb

## 2018-06-23 DIAGNOSIS — N183 Chronic kidney disease, stage 3 unspecified: Secondary | ICD-10-CM

## 2018-06-23 DIAGNOSIS — I10 Essential (primary) hypertension: Secondary | ICD-10-CM | POA: Diagnosis not present

## 2018-06-23 DIAGNOSIS — Z23 Encounter for immunization: Secondary | ICD-10-CM

## 2018-06-23 DIAGNOSIS — Z0000 Encounter for general adult medical examination without abnormal findings: Secondary | ICD-10-CM

## 2018-06-23 DIAGNOSIS — K439 Ventral hernia without obstruction or gangrene: Secondary | ICD-10-CM

## 2018-06-23 DIAGNOSIS — Z0001 Encounter for general adult medical examination with abnormal findings: Secondary | ICD-10-CM | POA: Diagnosis not present

## 2018-06-23 DIAGNOSIS — E7849 Other hyperlipidemia: Secondary | ICD-10-CM

## 2018-06-23 DIAGNOSIS — R7303 Prediabetes: Secondary | ICD-10-CM | POA: Diagnosis not present

## 2018-06-23 DIAGNOSIS — I2510 Atherosclerotic heart disease of native coronary artery without angina pectoris: Secondary | ICD-10-CM

## 2018-06-23 DIAGNOSIS — J449 Chronic obstructive pulmonary disease, unspecified: Secondary | ICD-10-CM

## 2018-06-23 LAB — CBC WITH DIFFERENTIAL/PLATELET
Basophils Absolute: 0.1 10*3/uL (ref 0.0–0.1)
Basophils Relative: 1.3 % (ref 0.0–3.0)
Eosinophils Absolute: 0.3 10*3/uL (ref 0.0–0.7)
Eosinophils Relative: 3.7 % (ref 0.0–5.0)
HCT: 52.6 % — ABNORMAL HIGH (ref 39.0–52.0)
HEMOGLOBIN: 17.7 g/dL — AB (ref 13.0–17.0)
Lymphocytes Relative: 30.8 % (ref 12.0–46.0)
Lymphs Abs: 2.3 10*3/uL (ref 0.7–4.0)
MCHC: 33.7 g/dL (ref 30.0–36.0)
MCV: 91.8 fl (ref 78.0–100.0)
MONOS PCT: 6.4 % (ref 3.0–12.0)
Monocytes Absolute: 0.5 10*3/uL (ref 0.1–1.0)
Neutro Abs: 4.4 10*3/uL (ref 1.4–7.7)
Neutrophils Relative %: 57.8 % (ref 43.0–77.0)
Platelets: 186 10*3/uL (ref 150.0–400.0)
RBC: 5.73 Mil/uL (ref 4.22–5.81)
RDW: 14.4 % (ref 11.5–15.5)
WBC: 7.6 10*3/uL (ref 4.0–10.5)

## 2018-06-23 LAB — COMPREHENSIVE METABOLIC PANEL
ALT: 16 U/L (ref 0–53)
AST: 14 U/L (ref 0–37)
Albumin: 4.4 g/dL (ref 3.5–5.2)
Alkaline Phosphatase: 56 U/L (ref 39–117)
BUN: 18 mg/dL (ref 6–23)
CO2: 25 mEq/L (ref 19–32)
Calcium: 9.7 mg/dL (ref 8.4–10.5)
Chloride: 105 mEq/L (ref 96–112)
Creatinine, Ser: 1.55 mg/dL — ABNORMAL HIGH (ref 0.40–1.50)
GFR: 46.52 mL/min — ABNORMAL LOW (ref >60.00)
Glucose, Bld: 112 mg/dL — ABNORMAL HIGH (ref 70–99)
Potassium: 4.4 mEq/L (ref 3.5–5.1)
Sodium: 139 mEq/L (ref 135–145)
TOTAL PROTEIN: 7.4 g/dL (ref 6.0–8.3)
Total Bilirubin: 0.9 mg/dL (ref 0.2–1.2)

## 2018-06-23 LAB — LIPID PANEL
Cholesterol: 112 mg/dL (ref 0–200)
HDL: 45.2 mg/dL (ref >39.00)
LDL Cholesterol: 48 mg/dL (ref 0–99)
NonHDL: 67.22
Total CHOL/HDL Ratio: 2
Triglycerides: 98 mg/dL (ref 0.0–149.0)
VLDL: 19.6 mg/dL (ref 0.0–40.0)

## 2018-06-23 LAB — HEMOGLOBIN A1C: Hgb A1c MFr Bld: 5.9 % (ref 4.6–6.5)

## 2018-06-23 LAB — TSH: TSH: 1.73 u[IU]/mL (ref 0.35–4.50)

## 2018-06-23 MED ORDER — FLUTICASONE-SALMETEROL 250-50 MCG/DOSE IN AEPB: 1.0000 | INHALATION_SPRAY | Freq: Two times a day (BID) | RESPIRATORY_TRACT | 11 refills | Status: DC | PRN

## 2018-06-23 MED ORDER — METOPROLOL TARTRATE 25 MG PO TABS: 12.5000 mg | ORAL_TABLET | Freq: Two times a day (BID) | ORAL | 3 refills | Status: DC

## 2018-06-23 NOTE — Assessment & Plan Note (Signed)
Check a1c Low sugar / carb diet Stressed regular exercise, weight loss  

## 2018-06-23 NOTE — Assessment & Plan Note (Signed)
BP well controlled Current regimen effective and well tolerated Continue current medications at current doses cmp  

## 2018-06-23 NOTE — Assessment & Plan Note (Signed)
No chest pain or palps SOB from COPD Continue current medications

## 2018-06-23 NOTE — Progress Notes (Addendum)
Subjective:   Preston Boyd is a 77 y.o. male who presents for Medicare Annual/Subsequent preventive examination.  Review of Systems:  No ROS.  Medicare Wellness Visit. Additional risk factors are reflected in the social history.  Cardiac Risk Factors include: advanced age (>43men, >60 women);dyslipidemia;hypertension;male gender Sleep patterns: feels rested on waking, gets up 2-4 times nightly to void and sleeps 7 hours nightly.    Home Safety/Smoke Alarms: Feels safe in home. Smoke alarms in place.  Living environment; residence and Firearm Safety: 1-story house/ trailer, no firearms. Lives alone, no needs for DME, good support system Seat Belt Safety/Bike Helmet: Wears seat belt.      Objective:    Vitals: BP 128/76   Pulse (!) 58   Resp 18   Ht 6\' 1"  (1.854 m)   Wt 236 lb (107 kg)   SpO2 98%   BMI 31.14 kg/m   Body mass index is 31.14 kg/m.  Advanced Directives 06/23/2018 06/19/2017 06/21/2015 06/18/2015 06/18/2015 06/14/2015 06/14/2015  Does Patient Have a Medical Advance Directive? No No No No No No No  Would patient like information on creating a medical advance directive? No - Patient declined Yes (ED - Information included in AVS) No - patient declined information - - No - patient declined information -    Tobacco Social History   Tobacco Use  Smoking Status Former Smoker  . Last attempt to quit: 07/22/1962  . Years since quitting: 55.9  Smokeless Tobacco Never Used     Counseling given: Not Answered  Past Medical History:  Diagnosis Date  . Acute cholecystitis s/p lap cholecystectomy 06/14/2015 06/13/2015  . BENIGN PROSTATIC HYPERTROPHY   . CAD (coronary artery disease) 01/2015   mild on cath 01/2015, med mgmt rec  . CKD (chronic kidney disease) stage 3, GFR 30-59 ml/min (HCC)   . COPD (chronic obstructive pulmonary disease) (HCC)   . DEGENERATIVE JOINT DISEASE, RIGHT HIP    s/p THR 11/2008  . GERD   . History of cardiovascular stress test 01/2015   Lexiscan Myoview 7/16:  Inferoseptal, inferior and apical ischemia, EF 67%, TID 1.09; intermediate risk  . Hypertension   . MIGRAINE HEADACHE   . Osteoarthritis of knee    s/p B TKR 07/2009  . RENAL INSUFFICIENCY    Past Surgical History:  Procedure Laterality Date  . CARDIAC CATHETERIZATION N/A 02/15/2015   Procedure: Left Heart Cath and Coronary Angiography;  Surgeon: Lennette Bihari, MD;  Location: [facility] INVASIVE CV LAB;  Service: Cardiovascular;  Laterality: N/A;  . CATARACT EXTRACTION, BILATERAL  10/2012  . CHOLECYSTECTOMY N/A 06/14/2015   Procedure: LAPAROSCOPIC CHOLECYSTECTOMY WITH INTRAOPERATIVE CHOLANGIOGRAM;  Surgeon: Karie Soda, MD;  Location: [facility];  Service: General;  Laterality: N/A;  . INGUINAL HERNIA REPAIR Bilateral 1960's  . REPLACEMENT TOTAL KNEE BILATERAL  07/2009   graves  . TONSILLECTOMY    . TOTAL HIP ARTHROPLASTY  11/2008   Dr. Luiz Blare   Family History  Problem Relation Age of Onset  . Colon cancer Mother   . Lung cancer Father   . Stroke Paternal Grandfather   . Alcohol abuse Other   . Arthritis Other   . Diabetes Other   . Hypertension Other   . Heart disease Other   . Heart attack Maternal Uncle    Social History   Socioeconomic History  . Marital status: Single    Spouse name: Not on file  . Number of children: 0  . Years of education: 16  . Highest  education level: Not on file  Occupational History  . Occupation: Retired  Engineer, productionocial Needs  . Financial resource strain: Not very hard  . Food insecurity:    Worry: Never true    Inability: Never true  . Transportation needs:    Medical: No    Non-medical: No  Tobacco Use  . Smoking status: Former Smoker    Last attempt to quit: 07/22/1962    Years since quitting: 55.9  . Smokeless tobacco: Never Used  Substance and Sexual Activity  . Alcohol use: Yes    Alcohol/week: 0.0 standard drinks    Comment: ocassionally  . Drug use: No  . Sexual activity: Never  Lifestyle  . Physical activity:     Days per week: 6 days    Minutes per session: 60 min  . Stress: Not at all  Relationships  . Social connections:    Talks on phone: More than three times a week    Gets together: Once a week    Attends religious service: Not on file    Active member of club or organization: Not on file    Attends meetings of clubs or organizations: Not on file    Relationship status: Not on file  Other Topics Concern  . Not on file  Social History Narrative   Single, lives alone. Retired from 3rd [employer]. 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 acetaminophen (TYLENOL) 500 MG tablet Take 500 mg by mouth every 6 (six) hours as needed for moderate pain.   Marland Kitchen. aspirin EC 325 MG tablet Take 1 tablet (325 mg total) by mouth daily.  Marland Kitchen. atorvastatin (LIPITOR) 40 MG tablet TAKE ONE TABLET BY MOUTH DAILY  . budesonide-formoterol (SYMBICORT) 160-4.5 MCG/ACT inhaler Inhale 2 puffs into the lungs 2 (two) times daily.  . Flaxseed, Linseed, (FLAX SEED OIL PO) Take 15 mLs by mouth daily.  . Fluticasone-Salmeterol (ADVAIR DISKUS) 250-50 MCG/DOSE AEPB Inhale 1 puff into the lungs 2 (two) times daily as needed (for shortness of breath).  . GuaiFENesin (MUCINEX PO) Take 1 tablet by mouth as needed (CONGESTION).   Marland Kitchen. Olopatadine HCl (PAZEO) 0.7 % SOLN Place 1 drop into both eyes daily as needed (for dry eyes).  . Propylene Glycol (SYSTANE BALANCE OP) Place 1 drop into both eyes as needed (for dry eyes).  . [DISCONTINUED] metoprolol tartrate (LOPRESSOR) 25 MG tablet Take 0.5 tablets (12.5 mg total) by mouth 2 (two) times daily.   No facility-administered encounter medications on file as of 06/23/2018.     Activities of Daily Living In your present state of health, do you have any difficulty performing the following activities: 06/23/2018  Hearing? N  Vision? N  Difficulty concentrating or making decisions? N  Walking or climbing  stairs? N  Dressing or bathing? N  Doing errands, shopping? N  Preparing Food and eating ? N  Using the Toilet? N  In the past six months, have you accidently leaked urine? N  Do you have problems with loss of bowel control? N  Managing your Medications? N  Managing your Finances? N  Housekeeping or managing your Housekeeping? N  Some recent data might be hidden    Patient Care Team: Pincus SanesBurns, Stacy J, MD as PCP - General (Internal Medicine) Jodi GeraldsGraves, John, MD as Consulting Physician (Orthopedic Surgery) Estrella DeedsYoakum, John, OD as Physician Assistant (Optometry) [place], [facility] Lowella BandyM Jr., MD as Consulting Physician (Urology) Nicki GuadalajaraKelly, Thomas  A, MD (Cardiology) Charlott Rakes, MD as Consulting Physician (Gastroenterology)   Assessment:   This is a routine wellness examination for [place]. Physical assessment deferred to PCP.   Exercise Activities and Dietary recommendations Current Exercise Habits: Home exercise routine, Type of exercise: walking;strength training/weights;calisthenics, Time (Minutes): 60, Frequency (Times/Week): 6, Weekly Exercise (Minutes/Week): 360, Intensity: Mild, Exercise limited by: None identified  Diet (meal preparation, eat out, water intake, caffeinated beverages, dairy products, fruits and vegetables): in general, a "healthy" diet  , well balanced   Reviewed heart healthy diet. Encouraged patient to increase daily water and healthy fluid intake.  Goals    . Patient Stated     Stay as healthy and as independent as possible. 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       Fall Risk Fall Risk  06/23/2018 06/23/2018 06/19/2017 03/27/2017 12/01/2014  Falls in the past year? 0 0 No No No  Number falls in past yr: - 0 - - -    Depression Screen PHQ 2/9 Scores 06/23/2018 06/23/2018 06/19/2017 06/17/2016  PHQ - 2 Score 0 0 0 0  PHQ- 9 Score 0 - - -    Cognitive Function MMSE - Mini Mental State Exam 06/19/2017  Orientation to time 5    Orientation to Place 5  Registration 3  Attention/ Calculation 4  Recall 2  Language- name 2 objects 2  Language- repeat 1  Language- follow 3 step command 3  Language- read & follow direction 1  Write a sentence 1  Copy design 1  Total score 28       Ad8 score reviewed for issues:  Issues making decisions: no  Less interest in hobbies / activities: no  Repeats questions, stories (family complaining): no  Trouble using ordinary gadgets (microwave, computer, phone):no  Forgets the month or year: no  Mismanaging finances: no  Remembering appts: no  Daily problems with thinking and/or memory: no Ad8 score is= 0  Immunization History  Administered Date(s) Administered  . Influenza Split 05/21/2012  . Influenza Whole 07/07/2009  . Influenza, High Dose Seasonal PF 05/18/2013, 06/12/2015, 06/17/2016, 06/19/2017, 06/23/2018  . Influenza,inj,Quad PF,6+ Mos 05/19/2014  . Pneumococcal Conjugate-13 05/19/2014  . Pneumococcal Polysaccharide-23 07/18/2016  . Td 07/07/2009  . Zoster 03/07/2011   Screening Tests Health Maintenance  Topic Date Due  . TETANUS/TDAP  07/08/2019  . INFLUENZA VACCINE  Completed  . PNA vac Low Risk Adult  Completed       Plan:    Nurse will follow-up with patient regarding more economical hearing device options and resources.   I have personally reviewed and noted the following in the patient's chart:   . Medical and social history . Use of alcohol, tobacco or illicit drugs  . Current medications and supplements . Functional ability and status . Nutritional status . Physical activity . Advanced directives . List of other physicians . Vitals . Screenings to include cognitive, depression, and falls . Referrals and appointments  In addition, I have reviewed and discussed with patient certain preventive protocols, quality metrics, and best practice recommendations. A written personalized care plan for preventive services as well as  general preventive health recommendations were provided to patient.     Wanda Plump, RN  06/23/2018    Medical screening examination/treatment/procedure(s) were performed by non-physician practitioner and as supervising physician I was immediately available for consultation/collaboration. I agree with above. Pincus Sanes, MD

## 2018-06-23 NOTE — Assessment & Plan Note (Signed)
Chronic SOB with exertion Taking Advair - also has symbicort at home and he will try that - will let me know which one works best when he needs a refill

## 2018-06-23 NOTE — Assessment & Plan Note (Signed)
cmp

## 2018-06-23 NOTE — Assessment & Plan Note (Signed)
Check lipid panel  Continue daily statin Regular exercise and healthy diet encouraged  

## 2018-06-23 NOTE — Assessment & Plan Note (Signed)
Non tender, reducible monitor 

## 2018-12-23 NOTE — Patient Instructions (Addendum)
Tests ordered today. Your results will be released to MyChart (or called to you) after review, usually within 72hours after test completion. If any changes need to be made, you will be notified at that same time.    Medications reviewed and updated.  Changes include :   Take Advair at least once a day - twice a day is ideal.     Please followup in 6 months   You are at risk for having sleep apnea - this can be dangerous if it is not treated - you should consider getting tested for this.     Sleep Apnea Sleep apnea is a condition in which breathing pauses or becomes shallow during sleep. Episodes of sleep apnea usually last 10 seconds or longer, and they may occur as many as 20 times an hour. Sleep apnea disrupts your sleep and keeps your body from getting the rest that it needs. This condition can increase your risk of certain health problems, including:  Heart attack.  Stroke.  Obesity.  Diabetes.  Heart failure.  Irregular heartbeat. There are three kinds of sleep apnea:  Obstructive sleep apnea. This kind is caused by a blocked or collapsed airway.  Central sleep apnea. This kind happens when the part of the brain that controls breathing does not send the correct signals to the muscles that control breathing.  Mixed sleep apnea. This is a combination of obstructive and central sleep apnea. What are the causes? The most common cause of this condition is a collapsed or blocked airway. An airway can collapse or become blocked if:  Your throat muscles are abnormally relaxed.  Your tongue and tonsils are larger than normal.  You are overweight.  Your airway is smaller than normal. What increases the risk? This condition is more likely to develop in people who:  Are overweight.  Smoke.  Have a smaller than normal airway.  Are elderly.  Are male.  Drink alcohol.  Take sedatives or tranquilizers.  Have a family history of sleep apnea. What are the signs or  symptoms? Symptoms of this condition include:  Trouble staying asleep.  Daytime sleepiness and tiredness.  Irritability.  Loud snoring.  Morning headaches.  Trouble concentrating.  Forgetfulness.  Decreased interest in sex.  Unexplained sleepiness.  Mood swings.  Personality changes.  Feelings of depression.  Waking up often during the night to urinate.  Dry mouth.  Sore throat. How is this diagnosed? This condition may be diagnosed with:  A medical history.  A physical exam.  A series of tests that are done while you are sleeping (sleep study). These tests are usually done in a sleep lab, but they may also be done at home. How is this treated? Treatment for this condition aims to restore normal breathing and to ease symptoms during sleep. It may involve managing health issues that can affect breathing, such as high blood pressure or obesity. Treatment may include:  Sleeping on your side.  Using a decongestant if you have nasal congestion.  Avoiding the use of depressants, including alcohol, sedatives, and narcotics.  Losing weight if you are overweight.  Making changes to your diet.  Quitting smoking.  Using a device to open your airway while you sleep, such as: ? An oral appliance. This is a custom-made mouthpiece that shifts your lower jaw forward. ? A continuous positive airway pressure (CPAP) device. This device delivers oxygen to your airway through a mask. ? A nasal expiratory positive airway pressure (EPAP) device. This device has  valves that you put into each nostril. ? A bi-level positive airway pressure (BPAP) device. This device delivers oxygen to your airway through a mask.  Surgery if other treatments do not work. During surgery, excess tissue is removed to create a wider airway. It is important to get treatment for sleep apnea. Without treatment, this condition can lead to:  High blood pressure.  Coronary artery disease.  (Men) An  inability to achieve or maintain an erection (impotence).  Reduced thinking abilities. Follow these instructions at home:  Make any lifestyle changes that your health care provider recommends.  Eat a healthy, well-balanced diet.  Take over-the-counter and prescription medicines only as told by your health care provider.  Avoid using depressants, including alcohol, sedatives, and narcotics.  Take steps to lose weight if you are overweight.  If you were given a device to open your airway while you sleep, use it only as told by your health care provider.  Do not use any tobacco products, such as cigarettes, chewing tobacco, and e-cigarettes. If you need help quitting, ask your health care provider.  Keep all follow-up visits as told by your health care provider. This is important. Contact a health care provider if:  The device that you received to open your airway during sleep is uncomfortable or does not seem to be working.  Your symptoms do not improve.  Your symptoms get worse. Get help right away if:  You develop chest pain.  You develop shortness of breath.  You develop discomfort in your back, arms, or stomach.  You have trouble speaking.  You have weakness on one side of your body.  You have drooping in your face. These symptoms may represent a serious problem that is an emergency. Do not wait to see if the symptoms will go away. Get medical help right away. Call your local emergency services (911 in the [place] Do not drive yourself to the hospital. This information is not intended to replace advice given to you by your health care provider. Make sure you discuss any questions you have with your health care provider. Document Released: 06/28/2002 Document Revised: 02/03/2017 Document Reviewed: 04/17/2015 Elsevier Interactive Patient Education  2019 [employer]

## 2018-12-23 NOTE — Progress Notes (Signed)
Subjective:    Patient ID: Preston Boyd, male    DOB: 16-Feb-1941, 78 y.o.   MRN: [id number]  HPI The patient is here for follow up.  He is exercising some - walking the dog.     CAD, Hypertension: He is taking his medication daily. He is compliant with a low sodium diet.  He denies chest pain, palpitations, edema, and regular headaches.  He does not monitor his blood pressure at home.    Hyperlipidemia: He is taking his medication daily. He is compliant with a low fat/cholesterol diet. He denies myalgias.   Prediabetes:  He is not compliant with a low sugar/carbohydrate diet.  He is exercising some.  CKD:  He drinks a lot of water - more in summer.  He does not take any nsaids.  COPD:  His breathing is worse over the past couple of years and worse in the spring.  He uses the Advair only as needed.  The Advair helps more than the Symbicort.  He has a dry cough, but rarely a wheeze.  He feels SOB with exertion and sometimes it will wake him up.    Snores:  He does snore. He denies waking up gasping for air.  He feels tired sometimes when he wakes up.  If he lays down to read he will fall asleep.  As long as he is moving and busy during the day he is not tired.  He has never been tested for OSA.    Medications and allergies reviewed with patient and updated if appropriate.  Patient Active Problem List   Diagnosis Date Noted  . Chronic right flank pain 12/18/2017  . Hearing loss 06/19/2017  . Ventral hernia 06/19/2017  . Prediabetes 06/18/2017  . Aortic atherosclerosis (HCC) 04/03/2017  . Left inguinal hernia 04/03/2017  . CAD (coronary artery disease) 01/20/2015  . Hyperlipidemia 12/05/2014  . CKD (chronic kidney disease) stage 3, GFR 30-59 ml/min (HCC)   . COPD (chronic obstructive pulmonary disease) (HCC)   . MIGRAINE HEADACHE 07/07/2009  . Hypertension 07/07/2009  . Nodular hyperplasia of prostate gland 07/07/2009  . DEGENERATIVE JOINT DISEASE, RIGHT HIP 07/07/2009  .  ARTHRITIS 07/07/2009    Current Outpatient Medications on File Prior to Visit  Medication Sig Dispense Refill  . acetaminophen (TYLENOL) 500 MG tablet Take 500 mg by mouth every 6 (six) hours as needed for moderate pain.     Marland Kitchen aspirin EC 325 MG tablet Take 1 tablet (325 mg total) by mouth daily. 100 tablet 3  . atorvastatin (LIPITOR) 40 MG tablet TAKE ONE TABLET BY MOUTH DAILY 90 tablet 2  . budesonide-formoterol (SYMBICORT) 160-4.5 MCG/ACT inhaler Inhale 2 puffs into the lungs 2 (two) times daily. 1 Inhaler 3  . Flaxseed, Linseed, (FLAX SEED OIL PO) Take 15 mLs by mouth daily.    . Fluticasone-Salmeterol (ADVAIR DISKUS) 250-50 MCG/DOSE AEPB Inhale 1 puff into the lungs 2 (two) times daily as needed (for shortness of breath). 60 each 11  . GuaiFENesin (MUCINEX PO) Take 1 tablet by mouth as needed (CONGESTION).     Marland Kitchen metoprolol tartrate (LOPRESSOR) 25 MG tablet Take 0.5 tablets (12.5 mg total) by mouth 2 (two) times daily. 90 tablet 3  . Olopatadine HCl (PAZEO) 0.7 % SOLN Place 1 drop into both eyes daily as needed (for dry eyes).    . Propylene Glycol (SYSTANE BALANCE OP) Place 1 drop into both eyes as needed (for dry eyes).     No current facility-administered medications  on file prior to visit.     Past Medical History:  Diagnosis Date  . Acute cholecystitis s/p lap cholecystectomy 06/14/2015 06/13/2015  . BENIGN PROSTATIC HYPERTROPHY   . CAD (coronary artery disease) 01/2015   mild on cath 01/2015, med mgmt rec  . CKD (chronic kidney disease) stage 3, GFR 30-59 ml/min (HCC)   . COPD (chronic obstructive pulmonary disease) (HCC)   . DEGENERATIVE JOINT DISEASE, RIGHT HIP    s/p THR 11/2008  . GERD   . History of cardiovascular stress test 01/2015   Lexiscan Myoview 7/16:  Inferoseptal, inferior and apical ischemia, EF 67%, TID 1.09; intermediate risk  . Hypertension   . MIGRAINE HEADACHE   . Osteoarthritis of knee    s/p B TKR 07/2009  . RENAL INSUFFICIENCY     Past Surgical  History:  Procedure Laterality Date  . CARDIAC CATHETERIZATION N/A 02/15/2015   Procedure: Left Heart Cath and Coronary Angiography;  Surgeon: Lennette Bihari, MD;  Location: [facility] INVASIVE CV LAB;  Service: Cardiovascular;  Laterality: N/A;  . CATARACT EXTRACTION, BILATERAL  10/2012  . CHOLECYSTECTOMY N/A 06/14/2015   Procedure: LAPAROSCOPIC CHOLECYSTECTOMY WITH INTRAOPERATIVE CHOLANGIOGRAM;  Surgeon: Karie Soda, MD;  Location: [facility] ORS;  Service: General;  Laterality: N/A;  . INGUINAL HERNIA REPAIR Bilateral 1960's  . REPLACEMENT TOTAL KNEE BILATERAL  07/2009   graves  . TONSILLECTOMY    . TOTAL HIP ARTHROPLASTY  11/2008   Dr. Luiz Blare    Social History   Socioeconomic History  . Marital status: Single    Spouse name: Not on file  . Number of children: 0  . Years of education: 58  . Highest education level: Not on file  Occupational History  . Occupation: Retired  Engineer, production  . Financial resource strain: Not very hard  . Food insecurity:    Worry: Never true    Inability: Never true  . Transportation needs:    Medical: No    Non-medical: No  Tobacco Use  . Smoking status: Former Smoker    Last attempt to quit: 07/22/1962    Years since quitting: 56.4  . Smokeless tobacco: Never Used  Substance and Sexual Activity  . Alcohol use: Yes    Alcohol/week: 0.0 standard drinks    Comment: ocassionally  . Drug use: No  . Sexual activity: Never  Lifestyle  . Physical activity:    Days per week: 6 days    Minutes per session: 60 min  . Stress: Not at all  Relationships  . Social connections:    Talks on phone: More than three times a week    Gets together: Once a week    Attends religious service: Not on file    Active member of club or organization: Not on file    Attends meetings of clubs or organizations: Not on file    Relationship status: Not on file  Other Topics Concern  . Not on file  Social History Narrative   Single, lives alone. Retired from 3rd shift [employer].  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 Colon cancer Mother   . Lung cancer Father   . Stroke Paternal Grandfather   . Alcohol abuse Other   . Arthritis Other   . Diabetes Other   . Hypertension Other   . Heart disease Other   . Heart attack Maternal Uncle     Review of Systems  Constitutional:  Negative for chills and fever.  Respiratory: Positive for cough, shortness of breath and wheezing (occasional).   Cardiovascular: Negative for chest pain, palpitations and leg swelling.  Musculoskeletal: Positive for gait problem (balance is not as good).  Neurological: Positive for headaches (occ). Negative for dizziness and light-headedness.       Objective:   Vitals:   12/24/18 0845  BP: 128/78  Pulse: 60  Temp: 98.4 F (36.9 C)  SpO2: 97%   BP Readings from Last 3 Encounters:  12/24/18 128/78  06/23/18 128/76  06/23/18 128/76   Wt Readings from Last 3 Encounters:  12/24/18 240 lb (108.9 kg)  06/23/18 236 lb (107 kg)  06/23/18 236 lb (107 kg)   Body mass index is 31.66 kg/m.   Physical Exam    Constitutional: Appears well-developed and well-nourished. Obese.  No distress.  HENT:  Head: Normocephalic and atraumatic.  Neck: Neck supple. No tracheal deviation present. No thyromegaly present.  No cervical lymphadenopathy Cardiovascular: Normal rate, regular rhythm and normal heart sounds.   No murmur heard. No carotid bruit .  No edema Pulmonary/Chest: Effort normal and breath sounds normal. No respiratory distress. No has no wheezes. No rales.  Skin: Skin is warm and dry. Not diaphoretic.  Psychiatric: Normal mood and affect. Behavior is normal.      Assessment & Plan:    See Problem List for Assessment and Plan of chronic medical problems.

## 2018-12-24 ENCOUNTER — Other Ambulatory Visit (INDEPENDENT_AMBULATORY_CARE_PROVIDER_SITE_OTHER): Payer: Medicare HMO

## 2018-12-24 ENCOUNTER — Other Ambulatory Visit: Payer: Self-pay

## 2018-12-24 ENCOUNTER — Ambulatory Visit (INDEPENDENT_AMBULATORY_CARE_PROVIDER_SITE_OTHER): Payer: Medicare HMO | Admitting: Internal Medicine

## 2018-12-24 ENCOUNTER — Encounter: Payer: Self-pay | Admitting: Internal Medicine

## 2018-12-24 VITALS — BP 128/78 | HR 60 | Temp 98.4°F | Ht 73.0 in | Wt 240.0 lb

## 2018-12-24 DIAGNOSIS — I10 Essential (primary) hypertension: Secondary | ICD-10-CM | POA: Diagnosis not present

## 2018-12-24 DIAGNOSIS — E7849 Other hyperlipidemia: Secondary | ICD-10-CM

## 2018-12-24 DIAGNOSIS — N183 Chronic kidney disease, stage 3 unspecified: Secondary | ICD-10-CM

## 2018-12-24 DIAGNOSIS — J449 Chronic obstructive pulmonary disease, unspecified: Secondary | ICD-10-CM

## 2018-12-24 DIAGNOSIS — R0683 Snoring: Secondary | ICD-10-CM | POA: Diagnosis not present

## 2018-12-24 DIAGNOSIS — R7303 Prediabetes: Secondary | ICD-10-CM

## 2018-12-24 DIAGNOSIS — I2510 Atherosclerotic heart disease of native coronary artery without angina pectoris: Secondary | ICD-10-CM

## 2018-12-24 LAB — COMPREHENSIVE METABOLIC PANEL
ALT: 21 U/L (ref 0–53)
AST: 17 U/L (ref 0–37)
Albumin: 4.6 g/dL (ref 3.5–5.2)
Alkaline Phosphatase: 61 U/L (ref 39–117)
BUN: 19 mg/dL (ref 6–23)
CO2: 26 mEq/L (ref 19–32)
Calcium: 10.1 mg/dL (ref 8.4–10.5)
Chloride: 102 mEq/L (ref 96–112)
Creatinine, Ser: 1.74 mg/dL — ABNORMAL HIGH (ref 0.40–1.50)
GFR: 38.25 mL/min — ABNORMAL LOW (ref >60.00)
Glucose, Bld: 104 mg/dL — ABNORMAL HIGH (ref 70–99)
Potassium: 4.4 mEq/L (ref 3.5–5.1)
Sodium: 138 mEq/L (ref 135–145)
Total Bilirubin: 1.4 mg/dL — ABNORMAL HIGH (ref 0.2–1.2)
Total Protein: 7.7 g/dL (ref 6.0–8.3)

## 2018-12-24 LAB — CBC WITH DIFFERENTIAL/PLATELET
Basophils Absolute: 0.1 10*3/uL (ref 0.0–0.1)
Basophils Relative: 1.2 % (ref 0.0–3.0)
Eosinophils Absolute: 0.3 10*3/uL (ref 0.0–0.7)
Eosinophils Relative: 3.6 % (ref 0.0–5.0)
HCT: 52.1 % — ABNORMAL HIGH (ref 39.0–52.0)
Hemoglobin: 17.7 g/dL — ABNORMAL HIGH (ref 13.0–17.0)
Lymphocytes Relative: 32 % (ref 12.0–46.0)
Lymphs Abs: 2.3 10*3/uL (ref 0.7–4.0)
MCHC: 33.9 g/dL (ref 30.0–36.0)
MCV: 91.7 fl (ref 78.0–100.0)
Monocytes Absolute: 0.5 10*3/uL (ref 0.1–1.0)
Monocytes Relative: 6.5 % (ref 3.0–12.0)
Neutro Abs: 4.1 10*3/uL (ref 1.4–7.7)
Neutrophils Relative %: 56.7 % (ref 43.0–77.0)
Platelets: 181 10*3/uL (ref 150.0–400.0)
RBC: 5.68 Mil/uL (ref 4.22–5.81)
RDW: 14.7 % (ref 11.5–15.5)
WBC: 7.3 10*3/uL (ref 4.0–10.5)

## 2018-12-24 LAB — LIPID PANEL
Cholesterol: 118 mg/dL (ref 0–200)
HDL: 48.1 mg/dL (ref >39.00)
LDL Cholesterol: 49 mg/dL (ref 0–99)
NonHDL: 69.41
Total CHOL/HDL Ratio: 2
Triglycerides: 104 mg/dL (ref 0.0–149.0)
VLDL: 20.8 mg/dL (ref 0.0–40.0)

## 2018-12-24 LAB — HEMOGLOBIN A1C: Hgb A1c MFr Bld: 6.1 % (ref 4.6–6.5)

## 2018-12-24 NOTE — Assessment & Plan Note (Signed)
BP well controlled Current regimen effective and well tolerated Continue current medications at current doses cmp  

## 2018-12-24 NOTE — Assessment & Plan Note (Signed)
No concerning symptoms - no cp, palps -- his SOB is related to his COPD and probably obesity, deconditioning Continue ASA, statin  BP well controlled Cmp, cbc, lipid

## 2018-12-24 NOTE — Assessment & Plan Note (Signed)
Check lipid panel  Continue daily statin Regular exercise and healthy diet encouraged  

## 2018-12-24 NOTE — Assessment & Plan Note (Signed)
At risk for OSA - advised that he get tested Discussed concerns with untreated OSA and information given He deferred a referral at this time and will let me know at his next visit

## 2018-12-24 NOTE — Assessment & Plan Note (Signed)
Not ideally controlled Not using inhaler daily - uses prn only Advised to use Advair at least once a day - ideally BID

## 2018-12-24 NOTE — Assessment & Plan Note (Signed)
Check a1c Not compliant with a low carb diet -- Low sugar / carb diet stressed Stressed regular exercise Weight loss

## 2018-12-24 NOTE — Assessment & Plan Note (Signed)
Avoids nsaids Continue increased water Cmp, cbc

## 2019-01-26 DIAGNOSIS — H04123 Dry eye syndrome of bilateral lacrimal glands: Secondary | ICD-10-CM | POA: Diagnosis not present

## 2019-01-26 DIAGNOSIS — H5213 Myopia, bilateral: Secondary | ICD-10-CM | POA: Diagnosis not present

## 2019-01-26 DIAGNOSIS — H52203 Unspecified astigmatism, bilateral: Secondary | ICD-10-CM | POA: Diagnosis not present

## 2019-01-26 DIAGNOSIS — H26492 Other secondary cataract, left eye: Secondary | ICD-10-CM | POA: Diagnosis not present

## 2019-01-26 DIAGNOSIS — Z961 Presence of intraocular lens: Secondary | ICD-10-CM | POA: Diagnosis not present

## 2019-01-26 DIAGNOSIS — H524 Presbyopia: Secondary | ICD-10-CM | POA: Diagnosis not present

## 2019-02-20 ENCOUNTER — Other Ambulatory Visit: Payer: Self-pay | Admitting: Internal Medicine

## 2019-03-13 IMAGING — CT CT RENAL STONE PROTOCOL
2 of 4 series · 17 of 46 positions shown, 19 images · non-contrast
Comparison: None.

CLINICAL DATA: Right flank pain for 2 months.

EXAM:
CT ABDOMEN AND PELVIS WITHOUT CONTRAST
TECHNIQUE: Multidetector CT imaging of the abdomen and pelvis was performed
following the standard protocol without IV contrast.

[Series 2: stone study 5.0 i30f 1 · axial · 0.95mm/px · z∈[-538,-43]mm · 14 of 109 slices shown, 16 images]
[im 5/109  soft-tissue]
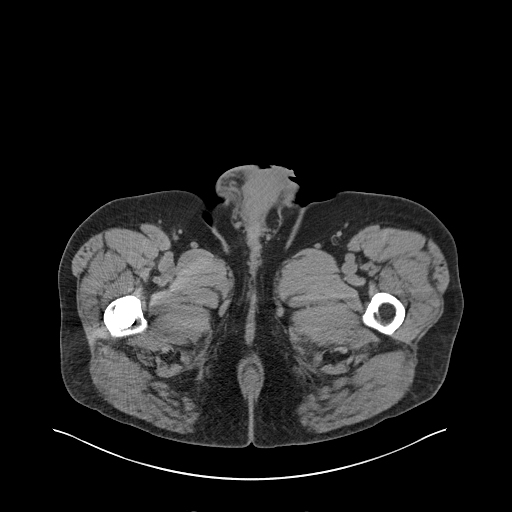
[im 5/109  bone]
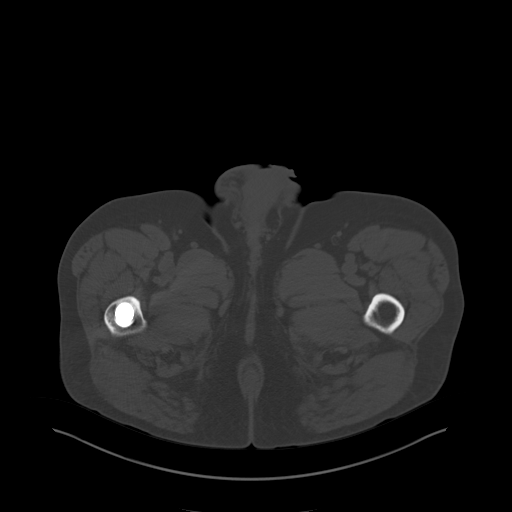
[im 14/109  soft-tissue]
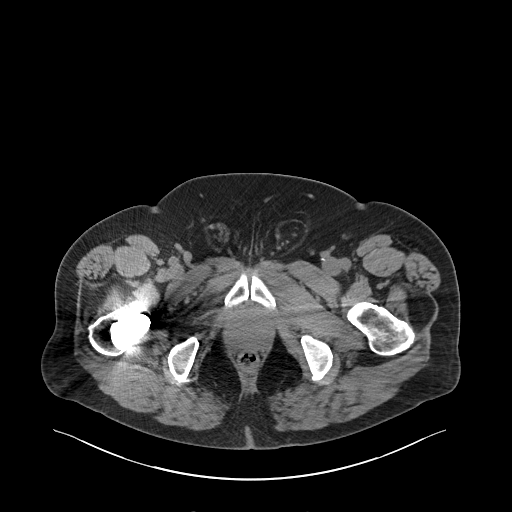
[im 23/109  soft-tissue]
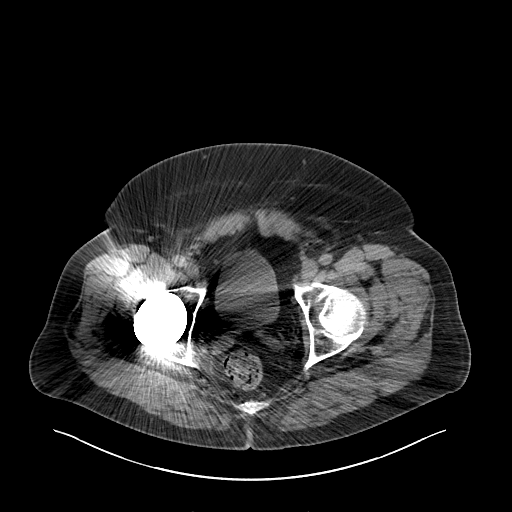
[im 28/109  soft-tissue]
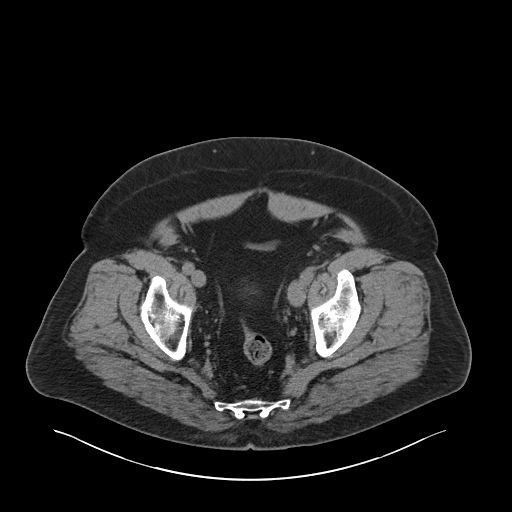
[im 37/109  soft-tissue]
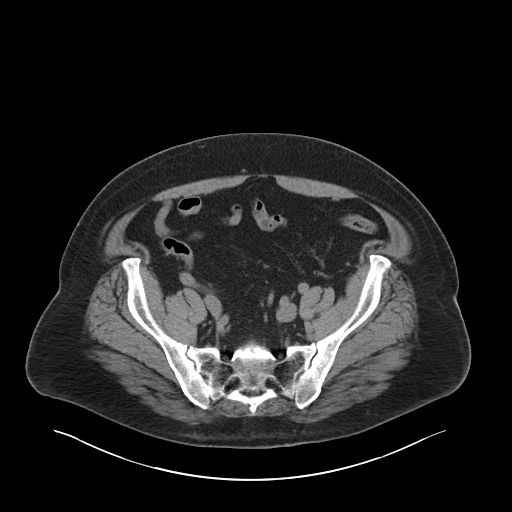
[im 46/109  soft-tissue]
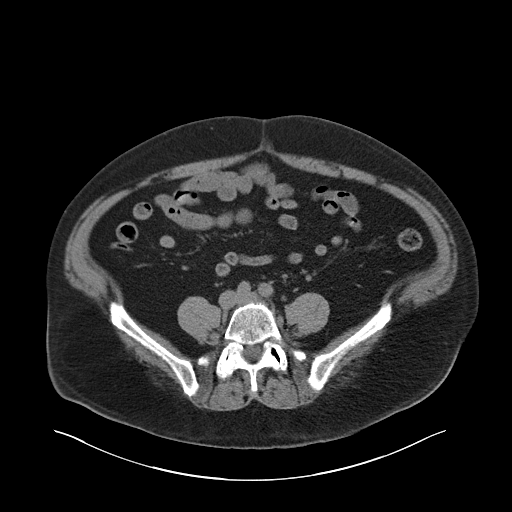
[im 50/109  soft-tissue]
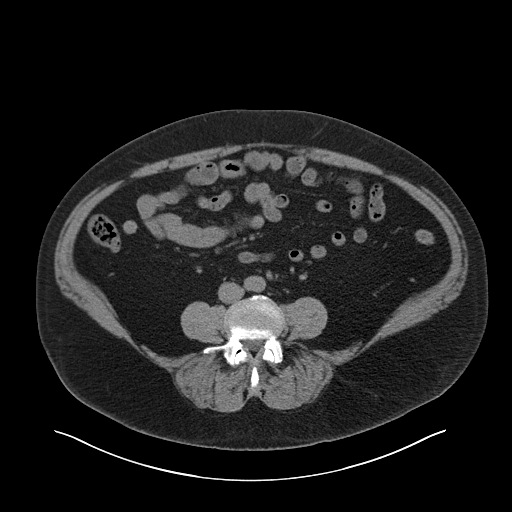
[im 59/109  soft-tissue]
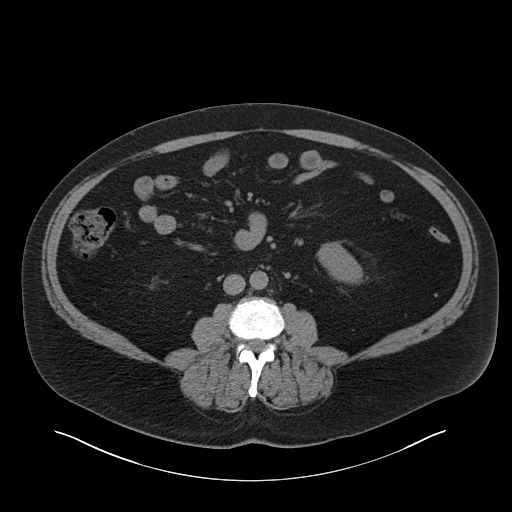
[im 64/109  soft-tissue]
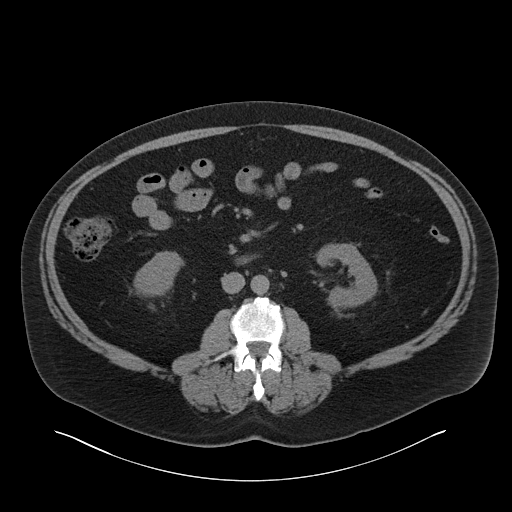
[im 64/109  bone]
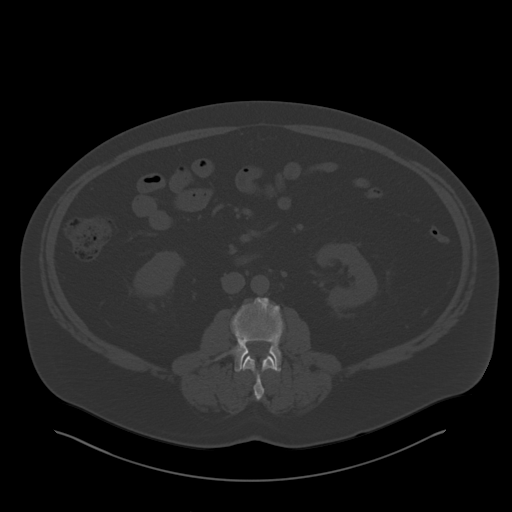
[im 73/109  soft-tissue]
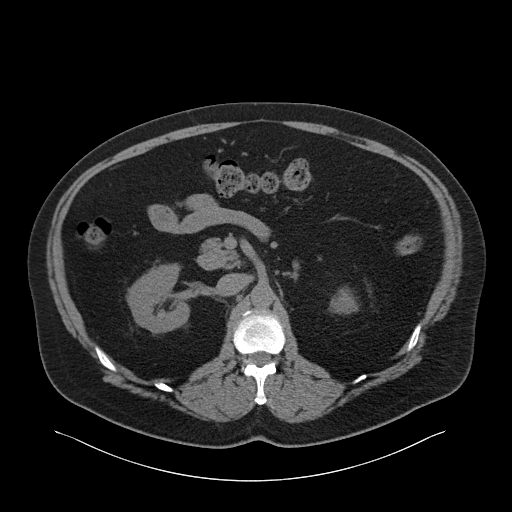
[im 82/109  soft-tissue]
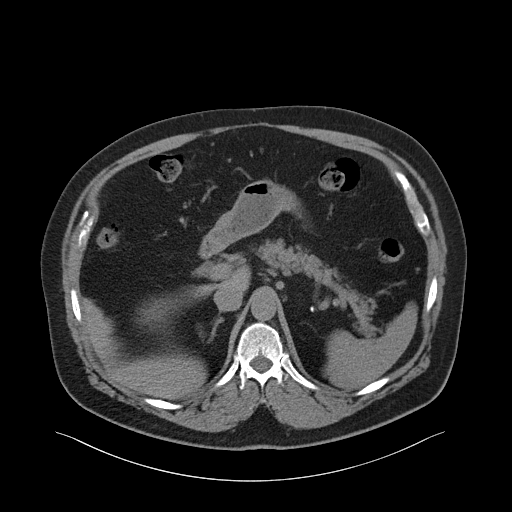
[im 86/109  soft-tissue]
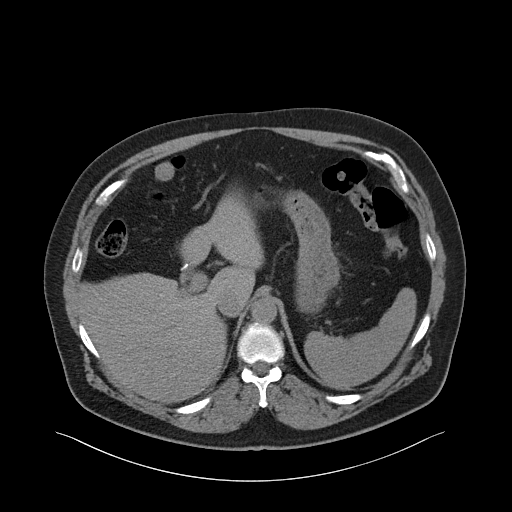
[im 95/109  soft-tissue]
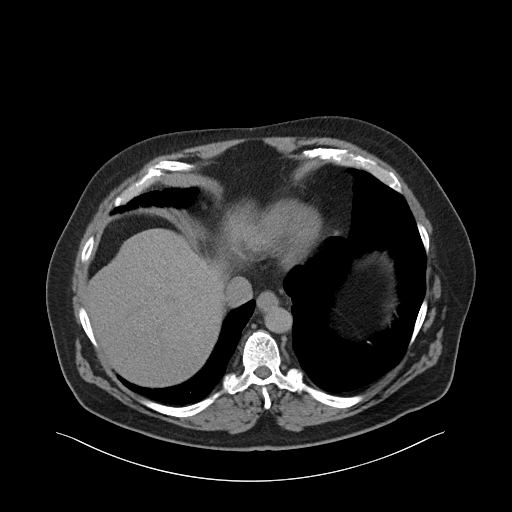
[im 104/109  soft-tissue]
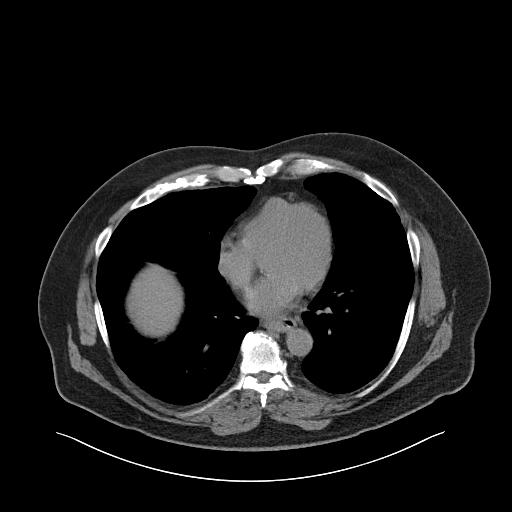

[Series 5: coronal soft tissue · coronal · 0.89mm/px · 3 of 101 slices shown]
[im 34/101  soft-tissue]
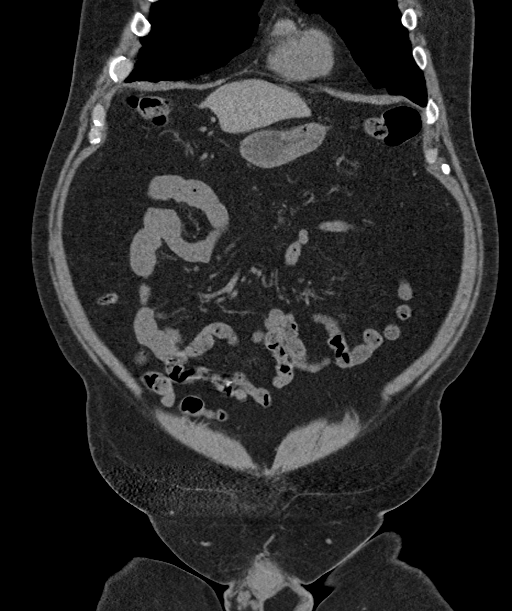
[im 45/101  soft-tissue]
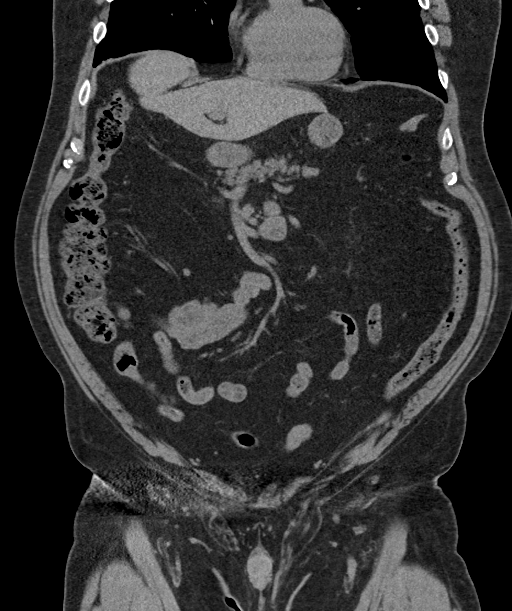
[im 56/101  soft-tissue]
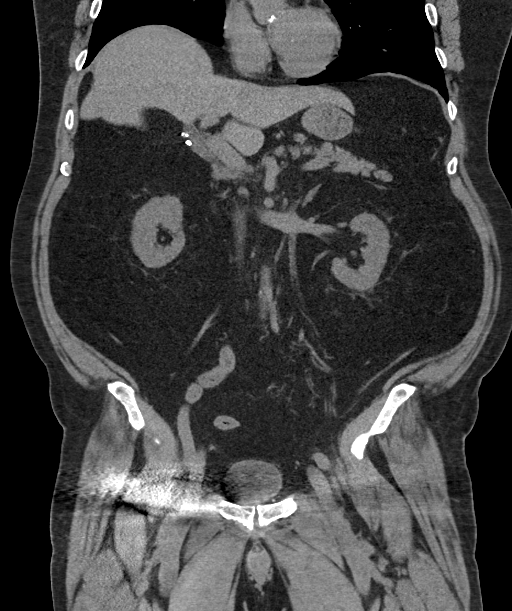

[17 of 46 positions shown; findings below may reference images not displayed]

FINDINGS: Lower chest: No acute abnormality.

Hepatobiliary: No focal liver abnormality is seen. Status post
cholecystectomy. No biliary dilatation.

Pancreas: Unremarkable. No pancreatic ductal dilatation or
surrounding inflammatory changes.

Spleen: Normal in size without focal abnormality.

Adrenals/Urinary Tract: Adrenal glands are unremarkable. Kidneys are
normal, without renal calculi, focal lesion, or hydronephrosis.
Bladder is unremarkable.

Stomach/Bowel: Stomach is within normal limits. Appendix appears
normal. No evidence of bowel wall thickening, distention, or
inflammatory changes.

Vascular/Lymphatic: Aortic atherosclerosis. No enlarged abdominal or
pelvic lymph nodes.

Reproductive: Mild prostatic enlargement is noted.

Other: No abnormal fluid collection is noted. Small fat containing
left inguinal hernia is noted.

Musculoskeletal: No acute or significant osseous findings.
IMPRESSION: Aortic atherosclerosis.

Prostatic enlargement.

No acute abnormality seen in the abdomen or pelvis.

## 2019-03-18 ENCOUNTER — Other Ambulatory Visit: Payer: Self-pay | Admitting: Internal Medicine

## 2019-05-10 DIAGNOSIS — R69 Illness, unspecified: Secondary | ICD-10-CM | POA: Diagnosis not present

## 2019-06-28 ENCOUNTER — Telehealth: Payer: Self-pay

## 2019-06-28 ENCOUNTER — Telehealth: Payer: Self-pay | Admitting: Internal Medicine

## 2019-06-28 NOTE — Telephone Encounter (Signed)
Can you call pt at your convenience and let him know what you do?

## 2019-06-28 NOTE — Telephone Encounter (Signed)
-----   Message from Binnie Rail, MD sent at 06/26/2019 11:42 AM EST ----- I see him this week and on his visit note is questioning doing an AWV in addition to CPE - I will only do a CPE - can Sharee Pimple see him at 27 if he agrees?

## 2019-06-28 NOTE — Telephone Encounter (Signed)
Spoke with Preston Boyd regarding AWV. Patient declined at this time. SF

## 2019-06-29 ENCOUNTER — Encounter: Payer: Medicare HMO | Admitting: Internal Medicine

## 2019-06-30 NOTE — Patient Instructions (Addendum)
Tests ordered today. Your results will be released to MyChart (or called to you) after review.  If any changes need to be made, you will be notified at that same time.  All other Health Maintenance issues reviewed.   All recommended immunizations and age-appropriate screenings are up-to-date or discussed.  No immunization administered today.   Medications reviewed and updated.  Changes include :   none   Please followup in 6 months    Health Maintenance, Male Adopting a healthy lifestyle and getting preventive care are important in promoting health and wellness. Ask your health care provider about:  The right schedule for you to have regular tests and exams.  Things you can do on your own to prevent diseases and keep yourself healthy. What should I know about diet, weight, and exercise? Eat a healthy diet   Eat a diet that includes plenty of vegetables, fruits, low-fat dairy products, and lean protein.  Do not eat a lot of foods that are high in solid fats, added sugars, or sodium. Maintain a healthy weight Body mass index (BMI) is a measurement that can be used to identify possible weight problems. It estimates body fat based on height and weight. Your health care provider can help determine your BMI and help you achieve or maintain a healthy weight. Get regular exercise Get regular exercise. This is one of the most important things you can do for your health. Most adults should:  Exercise for at least 150 minutes each week. The exercise should increase your heart rate and make you sweat (moderate-intensity exercise).  Do strengthening exercises at least twice a week. This is in addition to the moderate-intensity exercise.  Spend less time sitting. Even light physical activity can be beneficial. Watch cholesterol and blood lipids Have your blood tested for lipids and cholesterol at 77 years of age, then have this test every 5 years. You may need to have your cholesterol  levels checked more often if:  Your lipid or cholesterol levels are high.  You are older than 77 years of age.  You are at high risk for heart disease. What should I know about cancer screening? Many types of cancers can be detected early and may often be prevented. Depending on your health history and family history, you may need to have cancer screening at various ages. This may include screening for:  Colorectal cancer.  Prostate cancer.  Skin cancer.  Lung cancer. What should I know about heart disease, diabetes, and high blood pressure? Blood pressure and heart disease  High blood pressure causes heart disease and increases the risk of stroke. This is more likely to develop in people who have high blood pressure readings, are of African descent, or are overweight.  Talk with your health care provider about your target blood pressure readings.  Have your blood pressure checked: ? Every 3-5 years if you are 18-39 years of age. ? Every year if you are 40 years old or older.  If you are between the ages of 65 and 75 and are a current or former smoker, ask your health care provider if you should have a one-time screening for abdominal aortic aneurysm (AAA). Diabetes Have regular diabetes screenings. This checks your fasting blood sugar level. Have the screening done:  Once every three years after age 45 if you are at a normal weight and have a low risk for diabetes.  More often and at a younger age if you are overweight or have a high risk   for diabetes. What should I know about preventing infection? Hepatitis B If you have a higher risk for hepatitis B, you should be screened for this virus. Talk with your health care provider to find out if you are at risk for hepatitis B infection. Hepatitis C Blood testing is recommended for:  Everyone born from 1945 through 1965.  Anyone with known risk factors for hepatitis C. Sexually transmitted infections (STIs)  You should be  screened each year for STIs, including gonorrhea and chlamydia, if: ? You are sexually active and are younger than 77 years of age. ? You are older than 77 years of age and your health care provider tells you that you are at risk for this type of infection. ? Your sexual activity has changed since you were last screened, and you are at increased risk for chlamydia or gonorrhea. Ask your health care provider if you are at risk.  Ask your health care provider about whether you are at high risk for HIV. Your health care provider may recommend a prescription medicine to help prevent HIV infection. If you choose to take medicine to prevent HIV, you should first get tested for HIV. You should then be tested every 3 months for as long as you are taking the medicine. Follow these instructions at home: Lifestyle  Do not use any products that contain nicotine or tobacco, such as cigarettes, e-cigarettes, and chewing tobacco. If you need help quitting, ask your health care provider.  Do not use street drugs.  Do not share needles.  Ask your health care provider for help if you need support or information about quitting drugs. Alcohol use  Do not drink alcohol if your health care provider tells you not to drink.  If you drink alcohol: ? Limit how much you have to 0-2 drinks a day. ? Be aware of how much alcohol is in your drink. In the [place] one drink equals one 12 oz bottle of beer (355 mL), one 5 oz glass of wine (148 mL), or one 1 oz glass of hard liquor (44 mL). General instructions  Schedule regular health, dental, and eye exams.  Stay current with your vaccines.  Tell your health care provider if: ? You often feel depressed. ? You have ever been abused or do not feel safe at home. Summary  Adopting a healthy lifestyle and getting preventive care are important in promoting health and wellness.  Follow your health care provider's instructions about healthy diet, exercising, and getting  tested or screened for diseases.  Follow your health care provider's instructions on monitoring your cholesterol and blood pressure. This information is not intended to replace advice given to you by your health care provider. Make sure you discuss any questions you have with your health care provider. Document Released: 01/04/2008 Document Revised: 07/01/2018 Document Reviewed: 07/01/2018 Elsevier Patient Education  2020 Elsevier Inc.  

## 2019-06-30 NOTE — Progress Notes (Signed)
Subjective:    Patient ID: Preston Boyd, male    DOB: [birth year], 78 y.o.   MRN: [id number]  HPI He is here for a physical exam.   Overall he feels well and has no concerns.  He denies any changes in his history since he was here last.  Medications and allergies reviewed with patient and updated if appropriate.  Patient Active Problem List   Diagnosis Date Noted  . Snores 12/24/2018  . Chronic right flank pain 12/18/2017  . Hearing loss 06/19/2017  . Ventral hernia 06/19/2017  . Prediabetes 06/18/2017  . Aortic atherosclerosis (HCC) 04/03/2017  . Left inguinal hernia 04/03/2017  . CAD (coronary artery disease) 01/20/2015  . Hyperlipidemia 12/05/2014  . CKD (chronic kidney disease) stage 3, GFR 30-59 ml/min   . COPD (chronic obstructive pulmonary disease) (HCC)   . MIGRAINE HEADACHE 07/07/2009  . Hypertension 07/07/2009  . Nodular hyperplasia of prostate gland 07/07/2009  . DEGENERATIVE JOINT DISEASE, RIGHT HIP 07/07/2009  . ARTHRITIS 07/07/2009    Current Outpatient Medications on File Prior to Visit  Medication Sig Dispense Refill  . acetaminophen (TYLENOL) 500 MG tablet Take 500 mg by mouth every 6 (six) hours as needed for moderate pain.     Marland Kitchen. ADVAIR DISKUS 250-50 MCG/DOSE AEPB INHALE ONE PUFF INTO THE LUNGS  TWICE A DAY AS NEEDED FOR SHORTNESS OF BREATH 60 each 10  . aspirin EC 325 MG tablet Take 1 tablet (325 mg total) by mouth daily. 100 tablet 3  . atorvastatin (LIPITOR) 40 MG tablet TAKE ONE TABLET BY MOUTH DAILY 90 tablet 1  . Flaxseed, Linseed, (FLAX SEED OIL PO) Take 15 mLs by mouth daily.    . GuaiFENesin (MUCINEX PO) Take 1 tablet by mouth as needed (CONGESTION).     Marland Kitchen. metoprolol tartrate (LOPRESSOR) 25 MG tablet Take 0.5 tablets (12.5 mg total) by mouth 2 (two) times daily. 90 tablet 3  . Olopatadine HCl (PAZEO) 0.7 % SOLN Place 1 drop into both eyes daily as needed (for dry eyes).    . Propylene Glycol (SYSTANE BALANCE OP) Place 1 drop into both eyes as  needed (for dry eyes).     No current facility-administered medications on file prior to visit.    Past Medical History:  Diagnosis Date  . Acute cholecystitis s/p lap cholecystectomy 06/14/2015 06/13/2015  . BENIGN PROSTATIC HYPERTROPHY   . CAD (coronary artery disease) 01/2015   mild on cath 01/2015, med mgmt rec  . CKD (chronic kidney disease) stage 3, GFR 30-59 ml/min   . COPD (chronic obstructive pulmonary disease) (HCC)   . DEGENERATIVE JOINT DISEASE, RIGHT HIP    s/p THR 11/2008  . GERD   . History of cardiovascular stress test 01/2015   Lexiscan Myoview 7/16:  Inferoseptal, inferior and apical ischemia, EF 67%, TID 1.09; intermediate risk  . Hypertension   . MIGRAINE HEADACHE   . Osteoarthritis of knee    s/p B TKR 07/2009  . RENAL INSUFFICIENCY     Past Surgical History:  Procedure Laterality Date  . CARDIAC CATHETERIZATION N/A 02/15/2015   Procedure: Left Heart Cath and Coronary Angiography;  Surgeon: Lennette Biharihomas A Kelly, MD;  [facility];  Service: Cardiovascular;  Laterality: N/A;  . CATARACT EXTRACTION, BILATERAL  10/2012  . CHOLECYSTECTOMY N/A 06/14/2015   Procedure: LAPAROSCOPIC CHOLECYSTECTOMY WITH INTRAOPERATIVE CHOLANGIOGRAM;  Surgeon: Karie SodaSteven Gross, MD;  Location: [facility] ORS;  Service: General;  Laterality: N/A;  . INGUINAL HERNIA REPAIR Bilateral 1960's  .  REPLACEMENT TOTAL KNEE BILATERAL  07/2009   graves  . TONSILLECTOMY    . TOTAL HIP ARTHROPLASTY  11/2008   Dr. Berenice Primas    Social History   Socioeconomic History  . Marital status: Single    Spouse name: Not on file  . Number of children: 0  . Years of education: 101  . Highest education level: Not on file  Occupational History  . Occupation: Retired  Tobacco Use  . Smoking status: Former Smoker    Quit date: 07/22/1962    Years since quitting: 56.9  . Smokeless tobacco: Never Used  Substance and Sexual Activity  . Alcohol use: Yes    Alcohol/week: 0.0 standard drinks    Comment: ocassionally   . Drug use: No  . Sexual activity: Never  Other Topics Concern  . Not on file  Social History Narrative   Single, lives alone. Retired from [employer]. 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 Difficulty of Paying Living Expenses: Not very hard  Food Insecurity: No Food Insecurity  . Worried About Charity fundraiser in the Last Year: Never true  . Ran Out of Food in the Last Year: Never true  Transportation Needs: No Transportation Needs  . Lack of Transportation (Medical): No  . Lack of Transportation (Non-Medical): No  Physical Activity: Sufficiently Active  . Days of Exercise per Week: 6 days  . Minutes of Exercise per Session: 60 min  Stress: No Stress Concern Present  . Feeling of Stress : Not at all  Social Connections: Unknown  . Frequency of Communication with Friends and Family: More than three times a week  . Frequency of Social Gatherings with Friends and Family: Once a week  . Attends Religious Services: Not on file  . Active Member of Clubs or Organizations: Not on file  . Attends Archivist Meetings: Not on file  . Marital Status: Not on file    Family History  Problem Relation Age of Onset  . Colon cancer Mother   . Lung cancer Father   . Stroke Paternal Grandfather   . Alcohol abuse Other   . Arthritis Other   . Diabetes Other   . Hypertension Other   . Heart disease Other   . Heart attack Maternal Uncle     Review of Systems  Constitutional: Negative for chills and fever.  Eyes: Negative for visual disturbance.  Respiratory: Positive for cough (occ), shortness of breath (chronic) and wheezing (occ).   Cardiovascular: Negative for chest pain, palpitations and leg swelling.  Gastrointestinal: Positive for diarrhea (occ). Negative for abdominal pain, blood in stool, constipation and nausea.       Occ gerd  Genitourinary: Positive for difficulty  urinating. Negative for dysuria and hematuria.  Musculoskeletal: Positive for arthralgias and back pain.       Cramping at night  Skin: Negative for color change and rash.  Neurological: Positive for headaches (occ).  Psychiatric/Behavioral: Negative for dysphoric mood. The patient is not nervous/anxious.        Objective:   Vitals:   07/01/19 1041  BP: 120/78  Pulse: 62  Temp: 98.3 F (36.8 C)  SpO2: 96%   Filed Weights   07/01/19 1041  Weight: 252 lb 1.9 oz (114.4 kg)   Body mass index is 33.26 kg/m.  BP Readings from Last 3 Encounters:  07/01/19 120/78  12/24/18 128/78  06/23/18 128/76    Wt Readings from Last 3 Encounters:  07/01/19 252 lb 1.9 oz (114.4 kg)  12/24/18 240 lb (108.9 kg)  06/23/18 236 lb (107 kg)     Physical Exam Constitutional: He appears well-developed and well-nourished. No distress.  HENT:  Head: Normocephalic and atraumatic.  Right Ear: External ear normal.  Left Ear: External ear normal.  Mouth/Throat: Oropharynx is clear and moist.  Normal ear canals and TM b/l  Eyes: Conjunctivae and EOM are normal.  Neck: Neck supple. No tracheal deviation present. No thyromegaly present.  No carotid bruit  Cardiovascular: Normal rate, regular rhythm, normal heart sounds and intact distal pulses.   No murmur heard. Pulmonary/Chest: Effort normal and breath sounds normal. No respiratory distress. He has no wheezes. He has no rales.  Abdominal: Soft. He exhibits no distension. There is no tenderness.  Genitourinary: deferred  Musculoskeletal: He exhibits no edema.  Lymphadenopathy:   He has no cervical adenopathy.  Skin: Skin is warm and dry. He is not diaphoretic.  Psychiatric: He has a normal mood and affect. His behavior is normal.         Assessment & Plan:   Physical exam: Screening blood work  ordered Immunizations   up to date Colonoscopy    N/a due to age - never had one Eye exams   Up to date  Exercise   Walking a little Weight    Advised weight loss Substance abuse   none  See Problem List for Assessment and Plan of chronic medical problems.   FU in 1 year    This visit occurred during the SARS-CoV-2 public health emergency.  Safety protocols were in place, including screening questions prior to the visit, additional usage of staff PPE, and extensive cleaning of exam room while observing appropriate contact time as indicated for disinfecting solutions.

## 2019-07-01 ENCOUNTER — Encounter: Payer: Self-pay | Admitting: Internal Medicine

## 2019-07-01 ENCOUNTER — Ambulatory Visit (INDEPENDENT_AMBULATORY_CARE_PROVIDER_SITE_OTHER): Payer: Medicare HMO | Admitting: Internal Medicine

## 2019-07-01 ENCOUNTER — Other Ambulatory Visit: Payer: Self-pay

## 2019-07-01 VITALS — BP 120/78 | HR 62 | Temp 98.3°F | Ht 73.0 in | Wt 252.1 lb

## 2019-07-01 DIAGNOSIS — E7849 Other hyperlipidemia: Secondary | ICD-10-CM | POA: Diagnosis not present

## 2019-07-01 DIAGNOSIS — N400 Enlarged prostate without lower urinary tract symptoms: Secondary | ICD-10-CM | POA: Diagnosis not present

## 2019-07-01 DIAGNOSIS — N1832 Chronic kidney disease, stage 3b: Secondary | ICD-10-CM | POA: Diagnosis not present

## 2019-07-01 DIAGNOSIS — R7303 Prediabetes: Secondary | ICD-10-CM

## 2019-07-01 DIAGNOSIS — I10 Essential (primary) hypertension: Secondary | ICD-10-CM

## 2019-07-01 DIAGNOSIS — Z0000 Encounter for general adult medical examination without abnormal findings: Secondary | ICD-10-CM

## 2019-07-01 DIAGNOSIS — J449 Chronic obstructive pulmonary disease, unspecified: Secondary | ICD-10-CM | POA: Diagnosis not present

## 2019-07-01 DIAGNOSIS — I2510 Atherosclerotic heart disease of native coronary artery without angina pectoris: Secondary | ICD-10-CM

## 2019-07-01 NOTE — Assessment & Plan Note (Addendum)
Chronic cough, wheeze and SOB Taking advair daily, continue

## 2019-07-01 NOTE — Assessment & Plan Note (Signed)
Taking a supplement Deferred urology referral or prescription medication

## 2019-07-01 NOTE — Assessment & Plan Note (Signed)
BP well controlled Current regimen effective and well tolerated Continue current medications at current doses cmp  

## 2019-07-01 NOTE — Assessment & Plan Note (Addendum)
Check a1c Low sugar / carb diet Stressed regular exercise Weight loss encouraged

## 2019-07-01 NOTE — Assessment & Plan Note (Signed)
Check lipid panel, tsh, cmp  Continue daily statin Regular exercise and healthy diet encouraged  

## 2019-07-01 NOTE — Assessment & Plan Note (Signed)
No concerning chest pain, shortness of breath or palpitations Encourage increased exercise and weight loss Continue aspirin, statin, metoprolol CMP, CBC, TSH, lipid panel

## 2019-07-01 NOTE — Assessment & Plan Note (Signed)
Advised increasing water cmp cbc

## 2019-07-02 ENCOUNTER — Other Ambulatory Visit (INDEPENDENT_AMBULATORY_CARE_PROVIDER_SITE_OTHER): Payer: Medicare HMO

## 2019-07-02 DIAGNOSIS — R7303 Prediabetes: Secondary | ICD-10-CM | POA: Diagnosis not present

## 2019-07-02 DIAGNOSIS — E7849 Other hyperlipidemia: Secondary | ICD-10-CM | POA: Diagnosis not present

## 2019-07-02 DIAGNOSIS — N1832 Chronic kidney disease, stage 3b: Secondary | ICD-10-CM

## 2019-07-02 DIAGNOSIS — I2510 Atherosclerotic heart disease of native coronary artery without angina pectoris: Secondary | ICD-10-CM

## 2019-07-02 DIAGNOSIS — I10 Essential (primary) hypertension: Secondary | ICD-10-CM | POA: Diagnosis not present

## 2019-07-02 LAB — CBC WITH DIFFERENTIAL/PLATELET
Basophils Absolute: 0.1 10*3/uL (ref 0.0–0.1)
Basophils Relative: 1 % (ref 0.0–3.0)
Eosinophils Absolute: 0.3 10*3/uL (ref 0.0–0.7)
Eosinophils Relative: 3.9 % (ref 0.0–5.0)
HCT: 51.7 % (ref 39.0–52.0)
Hemoglobin: 16.9 g/dL (ref 13.0–17.0)
Lymphocytes Relative: 31.3 % (ref 12.0–46.0)
Lymphs Abs: 2.2 10*3/uL (ref 0.7–4.0)
MCHC: 32.8 g/dL (ref 30.0–36.0)
MCV: 92.9 fl (ref 78.0–100.0)
Monocytes Absolute: 0.5 10*3/uL (ref 0.1–1.0)
Monocytes Relative: 7 % (ref 3.0–12.0)
Neutro Abs: 3.9 10*3/uL (ref 1.4–7.7)
Neutrophils Relative %: 56.8 % (ref 43.0–77.0)
Platelets: 166 10*3/uL (ref 150.0–400.0)
RBC: 5.56 Mil/uL (ref 4.22–5.81)
RDW: 14.3 % (ref 11.5–15.5)
WBC: 6.9 10*3/uL (ref 4.0–10.5)

## 2019-07-02 LAB — COMPREHENSIVE METABOLIC PANEL
ALT: 21 U/L (ref 0–53)
AST: 16 U/L (ref 0–37)
Albumin: 4.2 g/dL (ref 3.5–5.2)
Alkaline Phosphatase: 62 U/L (ref 39–117)
BUN: 23 mg/dL (ref 6–23)
CO2: 25 mEq/L (ref 19–32)
Calcium: 9.4 mg/dL (ref 8.4–10.5)
Chloride: 106 mEq/L (ref 96–112)
Creatinine, Ser: 1.64 mg/dL — ABNORMAL HIGH (ref 0.40–1.50)
GFR: 40.9 mL/min — ABNORMAL LOW (ref >60.00)
Glucose, Bld: 132 mg/dL — ABNORMAL HIGH (ref 70–99)
Potassium: 4.1 mEq/L (ref 3.5–5.1)
Sodium: 140 mEq/L (ref 135–145)
Total Bilirubin: 0.9 mg/dL (ref 0.2–1.2)
Total Protein: 7 g/dL (ref 6.0–8.3)

## 2019-07-02 LAB — LIPID PANEL
Cholesterol: 109 mg/dL (ref 0–200)
HDL: 42.2 mg/dL (ref >39.00)
LDL Cholesterol: 46 mg/dL (ref 0–99)
NonHDL: 66.75
Total CHOL/HDL Ratio: 3
Triglycerides: 102 mg/dL (ref 0.0–149.0)
VLDL: 20.4 mg/dL (ref 0.0–40.0)

## 2019-07-02 LAB — TSH: TSH: 1.77 u[IU]/mL (ref 0.35–4.50)

## 2019-07-02 LAB — HEMOGLOBIN A1C: Hgb A1c MFr Bld: 6.1 % (ref 4.6–6.5)

## 2019-07-07 ENCOUNTER — Telehealth: Payer: Self-pay | Admitting: *Deleted

## 2019-07-07 NOTE — Telephone Encounter (Signed)
Reviewed lab results and physician's note with the patient. No further questions. 

## 2019-07-24 IMAGING — US US RENAL
1 series · 14 of 25 positions shown · non-contrast
Comparison: CT 04/01/2017.

CLINICAL DATA: Chronic RIGHT flank pain.

EXAM:
RENAL / URINARY TRACT ULTRASOUND COMPLETE

[Series 1: us renal · 0.26mm/px · 14 of 40 slices shown]
[im 1/40]
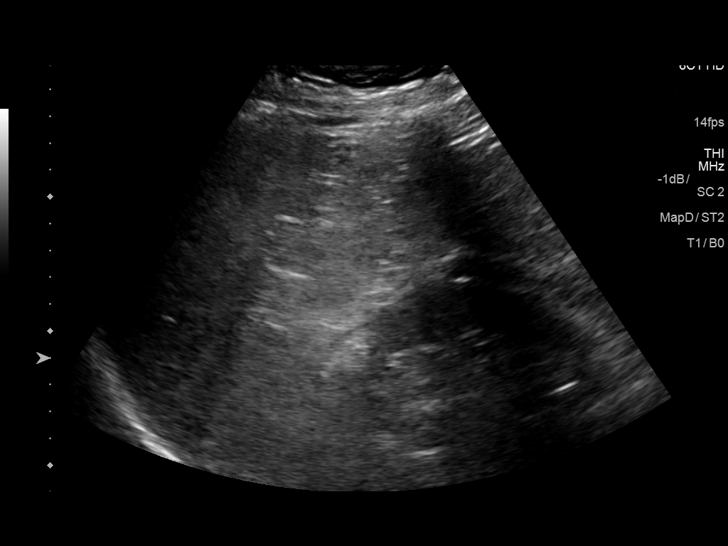
[im 4/40]
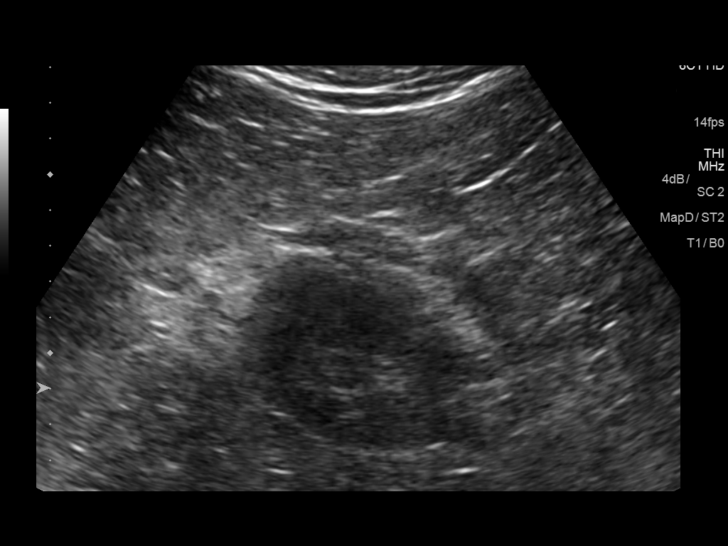
[im 7/40]
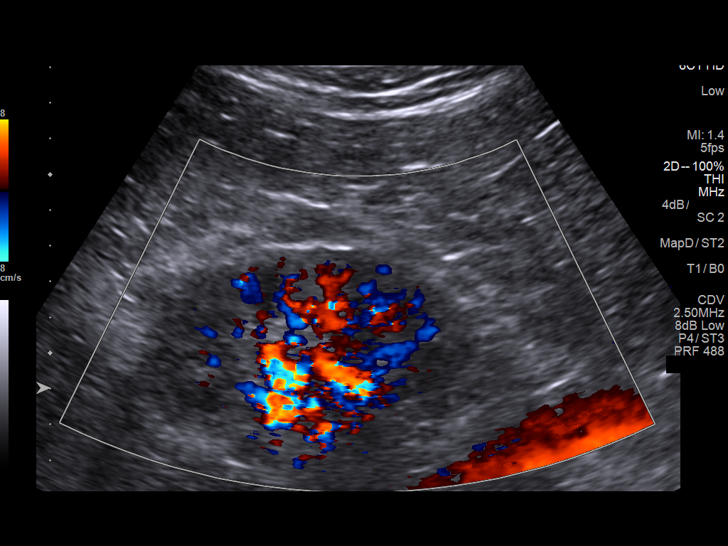
[im 10/40]
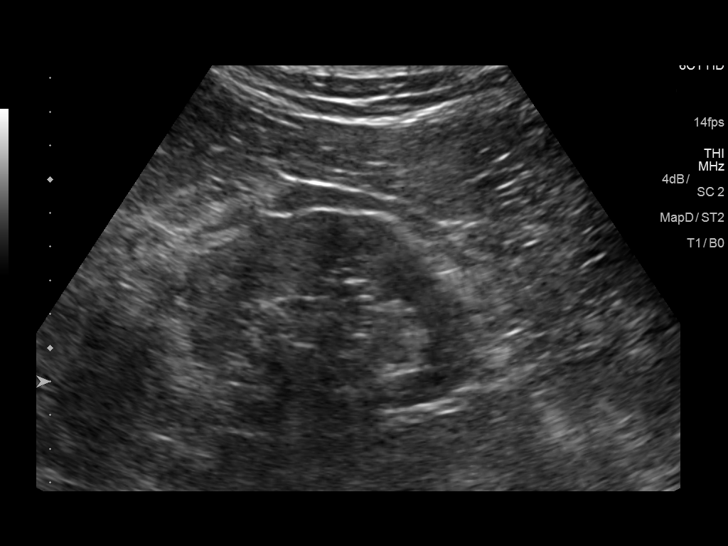
[im 14/40]
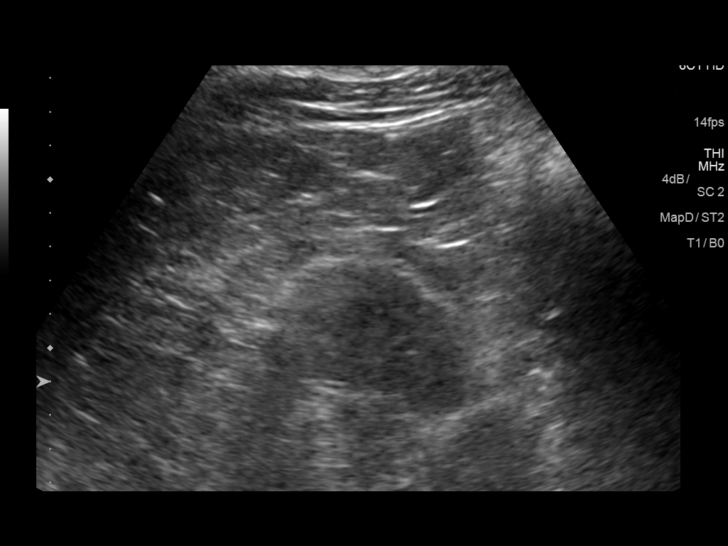
[im 15/40]
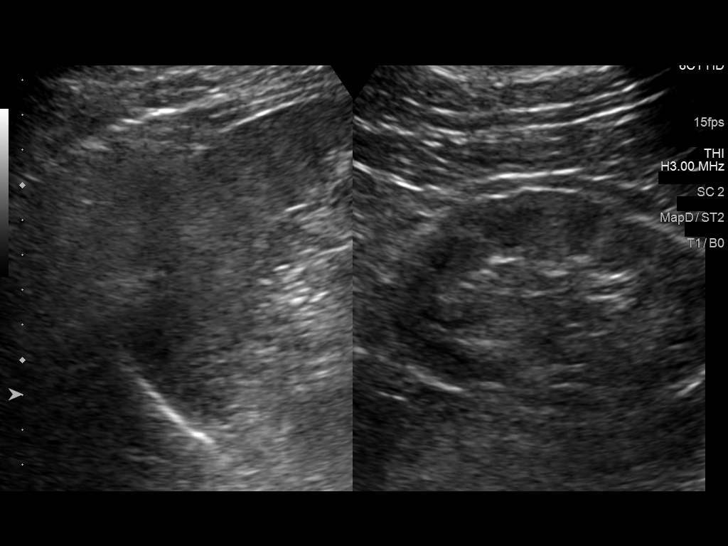
[im 18/40]
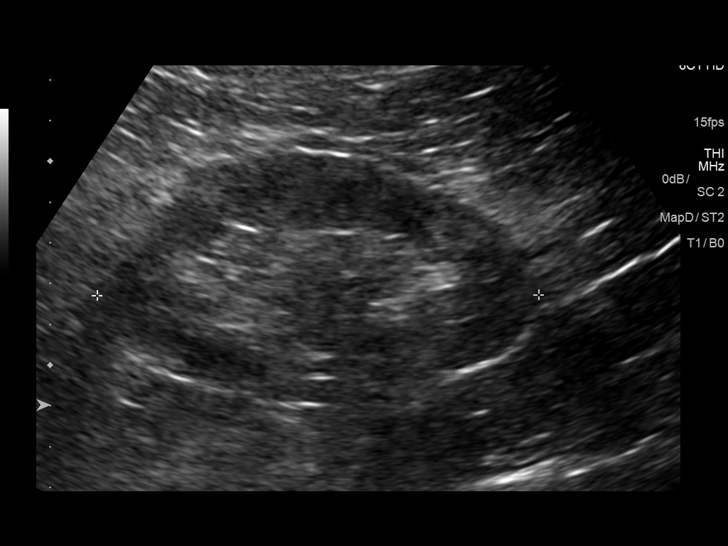
[im 22/40]
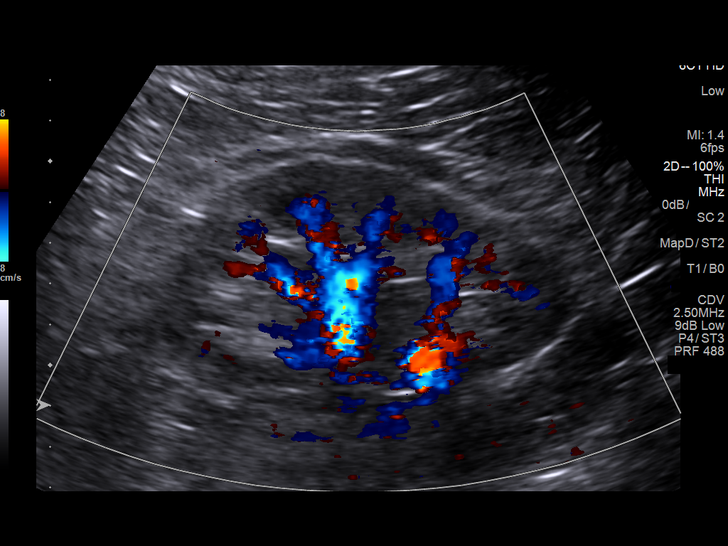
[im 25/40]
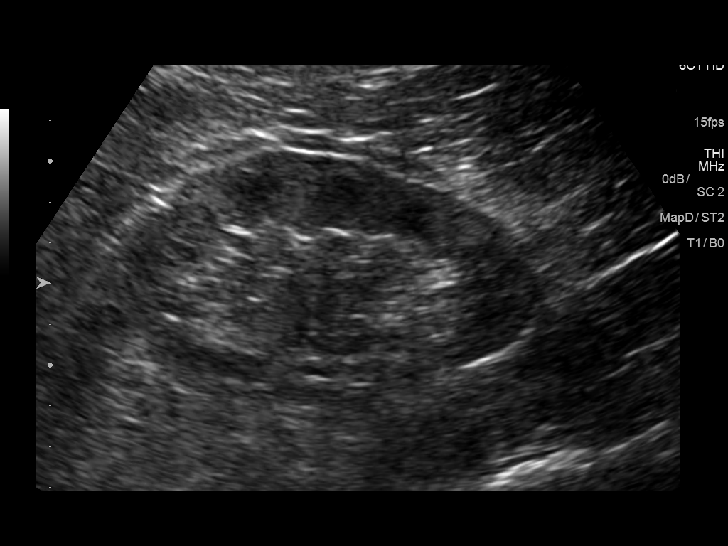
[im 27/40]
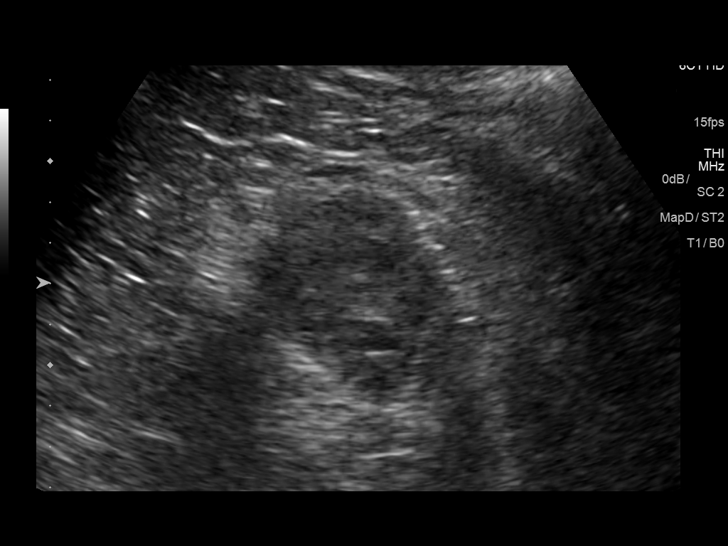
[im 30/40]
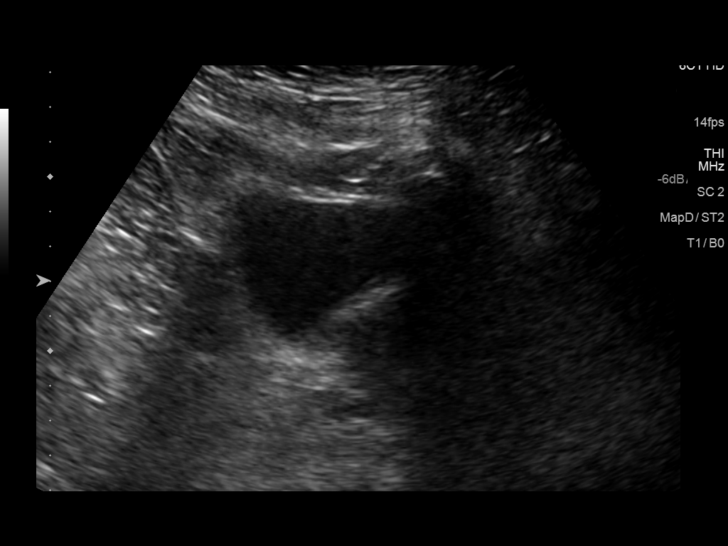
[im 33/40]
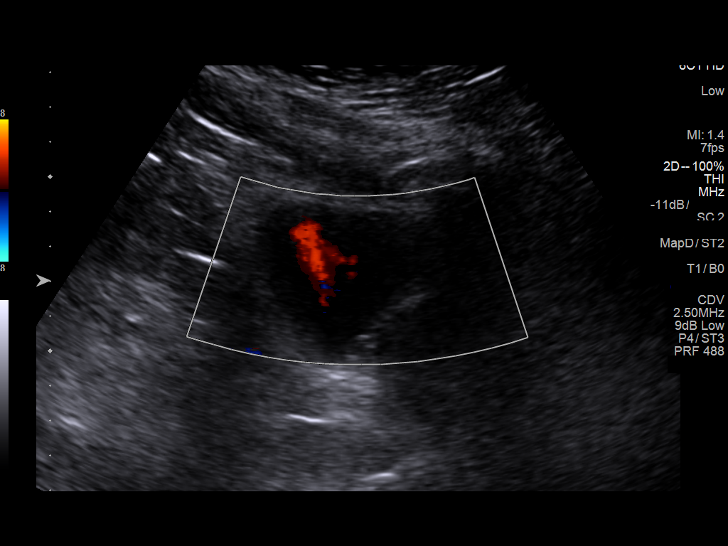
[im 36/40]
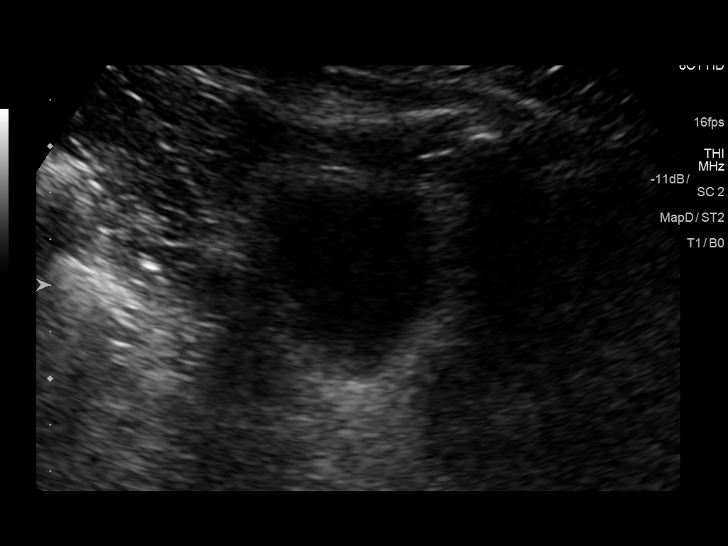
[im 40/40]
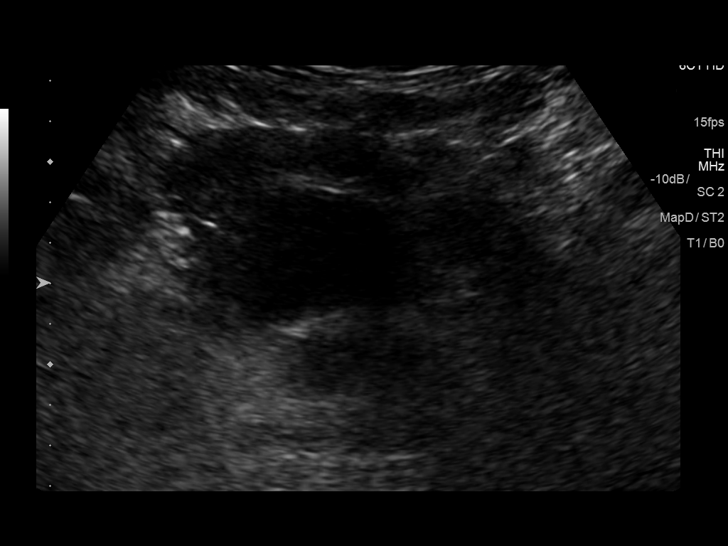

[14 of 25 positions shown; findings below may reference images not displayed]

FINDINGS: Right Kidney:

Length: 9.5 cm.. Echogenicity within normal limits. No mass or
hydronephrosis visualized.

Left Kidney:

Length: 10.8 cm.. Echogenicity within normal limits. No mass or
hydronephrosis visualized.

Bladder:

Appears normal for degree of bladder distention. Enlarged prostate
similar to priors.
IMPRESSION: No mass or hydronephrosis.  Prostatomegaly.

## 2019-08-16 ENCOUNTER — Other Ambulatory Visit: Payer: Self-pay | Admitting: Internal Medicine

## 2019-09-12 ENCOUNTER — Other Ambulatory Visit: Payer: Self-pay | Admitting: Internal Medicine

## 2019-12-13 ENCOUNTER — Telehealth: Payer: Self-pay | Admitting: Internal Medicine

## 2019-12-13 DIAGNOSIS — I2510 Atherosclerotic heart disease of native coronary artery without angina pectoris: Secondary | ICD-10-CM

## 2019-12-13 NOTE — Progress Notes (Signed)
  Chronic Care Management   Outreach Note  12/13/2019 Name: Preston Boyd MRN: [id number] DOB: 11-02-1940  Referred by: Pincus Sanes, MD Reason for referral : No chief complaint on file.   An unsuccessful telephone outreach was attempted today. The patient was referred to the pharmacist for assistance with care management and care coordination.   This note is not being shared with the patient for the following reason: To respect privacy (The patient or proxy has requested that the information not be shared).  Follow Up Plan:   Lynnae January Upstream Scheduler

## 2019-12-13 NOTE — Progress Notes (Signed)
  Chronic Care Management   Note  12/13/2019 Name: KERMAN PFOST MRN: [id number] DOB: 1940-11-26  DAYDEN VIVERETTE is a 79 y.o. year old male who is a primary care patient of Burns, Bobette Mo, MD. I reached out to Charna Busman by phone today in response to a referral sent by Mr. Doristine Counter PCP, Pincus Sanes, MD.   Mr. Kester was given information about Chronic Care Management services today including:  1. CCM service includes personalized support from designated clinical staff supervised by his physician, including individualized plan of care and coordination with other care providers 2. 24/7 contact phone numbers for assistance for urgent and routine care needs. 3. Service will only be billed when office clinical staff spend 20 minutes or more in a month to coordinate care. 4. Only one practitioner may furnish and bill the service in a calendar month. 5. The patient may stop CCM services at any time (effective at the end of the month) by phone call to the office staff.   Patient agreed to services and verbal consent obtained.   This note is not being shared with the patient for the following reason: To respect privacy (The patient or proxy has requested that the information not be shared).  Follow up plan:   Lynnae January Upstream Scheduler

## 2020-01-04 NOTE — Progress Notes (Signed)
Subjective:    Patient ID: Preston Boyd, male    DOB: Dec 16, 1940, 79 y.o.   MRN: [id number]  HPI The patient is here for follow up of their chronic medical problems, including CAD, htn, hyperlipidemia, prediabetes, ckd, COPD   He walks the dog for exercise, but not as much   He probably eats too many sugars   Medications and allergies reviewed with patient and updated if appropriate.  Patient Active Problem List   Diagnosis Date Noted  . Snores 12/24/2018  . Chronic right flank pain 12/18/2017  . Hearing loss 06/19/2017  . Ventral hernia 06/19/2017  . Prediabetes 06/18/2017  . Aortic atherosclerosis (HCC) 04/03/2017  . Left inguinal hernia 04/03/2017  . CAD (coronary artery disease) 01/20/2015  . Hyperlipidemia 12/05/2014  . CKD (chronic kidney disease) stage 3, GFR 30-59 ml/min   . COPD (chronic obstructive pulmonary disease) (HCC)   . MIGRAINE HEADACHE 07/07/2009  . Hypertension 07/07/2009  . Nodular hyperplasia of prostate gland 07/07/2009  . DEGENERATIVE JOINT DISEASE, RIGHT HIP 07/07/2009    Current Outpatient Medications on File Prior to Visit  Medication Sig Dispense Refill  . acetaminophen (TYLENOL) 500 MG tablet Take 500 mg by mouth every 6 (six) hours as needed for moderate pain.     Marland Kitchen ADVAIR DISKUS 250-50 MCG/DOSE AEPB INHALE ONE PUFF INTO THE LUNGS  TWICE A DAY AS NEEDED FOR SHORTNESS OF BREATH 60 each 10  . aspirin EC 325 MG tablet Take 1 tablet (325 mg total) by mouth daily. 100 tablet 3  . atorvastatin (LIPITOR) 40 MG tablet TAKE ONE TABLET BY MOUTH DAILY 90 tablet 1  . Flaxseed, Linseed, (FLAX SEED OIL PO) Take 15 mLs by mouth daily.    . GuaiFENesin (MUCINEX PO) Take 1 tablet by mouth as needed (CONGESTION).     Marland Kitchen metoprolol tartrate (LOPRESSOR) 25 MG tablet TAKE ONE-HALF TABLET BY MOUTH TWICE A DAY 90 tablet 1  . Olopatadine HCl (PAZEO) 0.7 % SOLN Place 1 drop into both eyes daily as needed (for dry eyes).    . Propylene Glycol (SYSTANE BALANCE OP)  Place 1 drop into both eyes as needed (for dry eyes).     No current facility-administered medications on file prior to visit.    Past Medical History:  Diagnosis Date  . Acute cholecystitis s/p lap cholecystectomy 06/14/2015 06/13/2015  . BENIGN PROSTATIC HYPERTROPHY   . CAD (coronary artery disease) 01/2015   mild on cath 01/2015, med mgmt rec  . CKD (chronic kidney disease) stage 3, GFR 30-59 ml/min   . COPD (chronic obstructive pulmonary disease) (HCC)   . DEGENERATIVE JOINT DISEASE, RIGHT HIP    s/p THR 11/2008  . GERD   . History of cardiovascular stress test 01/2015   Lexiscan Myoview 7/16:  Inferoseptal, inferior and apical ischemia, EF 67%, TID 1.09; intermediate risk  . Hypertension   . MIGRAINE HEADACHE   . Osteoarthritis of knee    s/p B TKR 07/2009  . RENAL INSUFFICIENCY     Past Surgical History:  Procedure Laterality Date  . CARDIAC CATHETERIZATION N/A 02/15/2015   Procedure: Left Heart Cath and Coronary Angiography;  Surgeon: Lennette Bihari, MD;  Location: [facility];  Service: Cardiovascular;  Laterality: N/A;  . CATARACT EXTRACTION, BILATERAL  10/2012  . CHOLECYSTECTOMY N/A 06/14/2015   Procedure: LAPAROSCOPIC CHOLECYSTECTOMY WITH INTRAOPERATIVE CHOLANGIOGRAM;  Surgeon: Karie Soda, MD;  Location: [facility];  Service: General;  Laterality: N/A;  . INGUINAL HERNIA REPAIR Bilateral 1960's  .  REPLACEMENT TOTAL KNEE BILATERAL  07/2009   graves  . TONSILLECTOMY    . TOTAL HIP ARTHROPLASTY  11/2008   Dr. Berenice Primas    Social History   Socioeconomic History  . Marital status: Single    Spouse name: Not on file  . Number of children: 0  . Years of education: 20  . Highest education level: Not on file  Occupational History  . Occupation: Retired  Tobacco Use  . Smoking status: Former Smoker    Quit date: 07/22/1962    Years since quitting: 57.4  . Smokeless tobacco: Never Used  Vaping Use  . Vaping Use: Never used  Substance and Sexual Activity  . Alcohol  use: Yes    Alcohol/week: 0.0 standard drinks    Comment: ocassionally  . Drug use: No  . Sexual activity: Never  Other Topics Concern  . Not on file  Social History Narrative   Single, lives alone. Retired from [employer]. 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 Difficulty of Paying Living Expenses:   Food Insecurity:   . Worried About Charity fundraiser in the Last Year:   . Arboriculturist in the Last Year:   Transportation Needs:   . Film/video editor (Medical):   Marland Kitchen Lack of Transportation (Non-Medical):   Physical Activity:   . Days of Exercise per Week:   . Minutes of Exercise per Session:   Stress:   . Feeling of Stress :   Social Connections:   . Frequency of Communication with Friends and Family:   . Frequency of Social Gatherings with Friends and Family:   . Attends Religious Services:   . Active Member of Clubs or Organizations:   . Attends Archivist Meetings:   Marland Kitchen Marital Status:     Family History  Problem Relation Age of Onset  . Colon cancer Mother   . Lung cancer Father   . Stroke Paternal Grandfather   . Alcohol abuse Other   . Arthritis Other   . Diabetes Other   . Hypertension Other   . Heart disease Other   . Heart attack Maternal Uncle     Review of Systems  Constitutional: Negative for fever.  Respiratory: Positive for cough (chronic), shortness of breath (chronic) and wheezing.   Cardiovascular: Negative for chest pain, palpitations and leg swelling.  Neurological: Negative for headaches.       Objective:   Vitals:   01/05/20 0827  BP: 122/70  Pulse: 61  Temp: 98.2 F (36.8 C)  SpO2: 97%   BP Readings from Last 3 Encounters:  01/05/20 122/70  07/01/19 120/78  12/24/18 128/78   Wt Readings from Last 3 Encounters:  01/05/20 249 lb (112.9 kg)  07/01/19 252 lb 1.9 oz (114.4 kg)  12/24/18 240 lb (108.9 kg)   Body mass index  is 32.85 kg/m.   Physical Exam    Constitutional: Appears well-developed and well-nourished. No distress.  HENT:  Head: Normocephalic and atraumatic.  Neck: Neck supple. No tracheal deviation present. No thyromegaly present.  No cervical lymphadenopathy Cardiovascular: Normal rate, regular rhythm and normal heart sounds.   No murmur heard. No carotid bruit .  No edema Pulmonary/Chest: Effort normal and breath sounds normal. No respiratory distress. No has no wheezes. No rales.  Skin: Skin is warm and dry. Not diaphoretic.  Psychiatric: Normal mood and affect. Behavior is  normal.      Assessment & Plan:    See Problem List for Assessment and Plan of chronic medical problems.    This visit occurred during the SARS-CoV-2 public health emergency.  Safety protocols were in place, including screening questions prior to the visit, additional usage of staff PPE, and extensive cleaning of exam room while observing appropriate contact time as indicated for disinfecting solutions.

## 2020-01-04 NOTE — Patient Instructions (Addendum)
  Blood work was ordered.     Medications reviewed and updated.  Changes include :   Try a different inhaler  Your prescription(s) have been submitted to your pharmacy. Please take as directed and contact our office if you believe you are having problem(s) with the medication(s).    Please followup in 6 months

## 2020-01-05 ENCOUNTER — Ambulatory Visit (INDEPENDENT_AMBULATORY_CARE_PROVIDER_SITE_OTHER): Payer: Medicare HMO

## 2020-01-05 ENCOUNTER — Encounter: Payer: Self-pay | Admitting: Internal Medicine

## 2020-01-05 ENCOUNTER — Other Ambulatory Visit: Payer: Self-pay

## 2020-01-05 ENCOUNTER — Ambulatory Visit (INDEPENDENT_AMBULATORY_CARE_PROVIDER_SITE_OTHER): Payer: Medicare HMO | Admitting: Internal Medicine

## 2020-01-05 VITALS — BP 122/70 | HR 61 | Ht 73.0 in | Wt 249.0 lb

## 2020-01-05 VITALS — BP 122/70 | HR 61 | Temp 98.2°F | Ht 73.0 in | Wt 249.0 lb

## 2020-01-05 DIAGNOSIS — E7849 Other hyperlipidemia: Secondary | ICD-10-CM

## 2020-01-05 DIAGNOSIS — N1832 Chronic kidney disease, stage 3b: Secondary | ICD-10-CM

## 2020-01-05 DIAGNOSIS — I700 Atherosclerosis of aorta: Secondary | ICD-10-CM | POA: Diagnosis not present

## 2020-01-05 DIAGNOSIS — I10 Essential (primary) hypertension: Secondary | ICD-10-CM | POA: Diagnosis not present

## 2020-01-05 DIAGNOSIS — R7303 Prediabetes: Secondary | ICD-10-CM | POA: Diagnosis not present

## 2020-01-05 DIAGNOSIS — Z0000 Encounter for general adult medical examination without abnormal findings: Secondary | ICD-10-CM | POA: Diagnosis not present

## 2020-01-05 DIAGNOSIS — I2510 Atherosclerotic heart disease of native coronary artery without angina pectoris: Secondary | ICD-10-CM | POA: Diagnosis not present

## 2020-01-05 DIAGNOSIS — J449 Chronic obstructive pulmonary disease, unspecified: Secondary | ICD-10-CM

## 2020-01-05 LAB — COMPREHENSIVE METABOLIC PANEL
ALT: 19 U/L (ref 0–53)
AST: 16 U/L (ref 0–37)
Albumin: 4.3 g/dL (ref 3.5–5.2)
Alkaline Phosphatase: 69 U/L (ref 39–117)
BUN: 18 mg/dL (ref 6–23)
CO2: 27 mEq/L (ref 19–32)
Calcium: 9.3 mg/dL (ref 8.4–10.5)
Chloride: 105 mEq/L (ref 96–112)
Creatinine, Ser: 1.71 mg/dL — ABNORMAL HIGH (ref 0.40–1.50)
GFR: 38.92 mL/min — ABNORMAL LOW (ref >60.00)
Glucose, Bld: 112 mg/dL — ABNORMAL HIGH (ref 70–99)
Potassium: 4.4 mEq/L (ref 3.5–5.1)
Sodium: 139 mEq/L (ref 135–145)
Total Bilirubin: 1.2 mg/dL (ref 0.2–1.2)
Total Protein: 6.6 g/dL (ref 6.0–8.3)

## 2020-01-05 LAB — HEMOGLOBIN A1C: Hgb A1c MFr Bld: 6.2 % (ref 4.6–6.5)

## 2020-01-05 MED ORDER — UMECLIDINIUM-VILANTEROL 62.5-25 MCG/INH IN AEPB: 1.0000 | INHALATION_SPRAY | Freq: Every day | RESPIRATORY_TRACT | 5 refills | Status: DC

## 2020-01-05 NOTE — Assessment & Plan Note (Signed)
Chronic lipid panel well controlled - recheck in 6 months Continue daily statin Regular exercise and healthy diet encouraged

## 2020-01-05 NOTE — Assessment & Plan Note (Signed)
Chronic No symptoms of angina Continue ASA, statin, metoprolol Stressed regular exercise, healthy diet

## 2020-01-05 NOTE — Patient Instructions (Signed)
Mr. Preston Boyd , Thank you for taking time to come for your Medicare Wellness Visit. I appreciate your ongoing commitment to your health goals. Please review the following plan we discussed and let me know if I can assist you in the future.   Screening recommendations/referrals: Colonoscopy: never done Recommended yearly ophthalmology/optometry visit for glaucoma screening and checkup Recommended yearly dental visit for hygiene and checkup  Vaccinations: Influenza vaccine: 05/10/2019 Pneumococcal vaccine: completed Tdap vaccine: 07/07/2009; overdue 2020 Shingles vaccine: will check with local pharmacy Covid-19: completed  Advanced directives: Advance directive discussed with you today. Even though you declined this today please call our office should you change your mind and we can give you the proper paperwork for you to fill out.  Conditions/risks identified: Yes; Please continue to do your personal lifestyle choices by: daily care of teeth and gums, regular physical activity (goal should be 5 days a week for 30 minutes), eat a healthy diet, avoid tobacco and drug use, limiting any alcohol intake, taking a low-dose aspirin (if not allergic or have been advised by your provider otherwise) and taking vitamins and minerals as recommended by your provider.  Next appointment: Please schedule your next Medicare Wellness Visit with your Nurse Health Advisor in 1 year.  Preventive Care 70 Years and Older, Male Preventive care refers to lifestyle choices and visits with your health care provider that can promote health and wellness. What does preventive care include?  A yearly physical exam. This is also called an annual well check.  Dental exams once or twice a year.  Routine eye exams. Ask your health care provider how often you should have your eyes checked.  Personal lifestyle choices, including:  Daily care of your teeth and gums.  Regular physical activity.  Eating a healthy  diet.  Avoiding tobacco and drug use.  Limiting alcohol use.  Practicing safe sex.  Taking low doses of aspirin every day.  Taking vitamin and mineral supplements as recommended by your health care provider. What happens during an annual well check? The services and screenings done by your health care provider during your annual well check will depend on your age, overall health, lifestyle risk factors, and family history of disease. Counseling  Your health care provider may ask you questions about your:  Alcohol use.  Tobacco use.  Drug use.  Emotional well-being.  Home and relationship well-being.  Sexual activity.  Eating habits.  History of falls.  Memory and ability to understand (cognition).  Work and work Statistician. Screening  You may have the following tests or measurements:  Height, weight, and BMI.  Blood pressure.  Lipid and cholesterol levels. These may be checked every 5 years, or more frequently if you are over 57 years old.  Skin check.  Lung cancer screening. You may have this screening every year starting at age 72 if you have a 30-pack-year history of smoking and currently smoke or have quit within the past 15 years.  Fecal occult blood test (FOBT) of the stool. You may have this test every year starting at age 58.  Flexible sigmoidoscopy or colonoscopy. You may have a sigmoidoscopy every 5 years or a colonoscopy every 10 years starting at age 19.  Prostate cancer screening. Recommendations will vary depending on your family history and other risks.  Hepatitis C blood test.  Hepatitis B blood test.  Sexually transmitted disease (STD) testing.  Diabetes screening. This is done by checking your blood sugar (glucose) after you have not eaten for a  while (fasting). You may have this done every 1-3 years.  Abdominal aortic aneurysm (AAA) screening. You may need this if you are a current or former smoker.  Osteoporosis. You may be screened  starting at age 51 if you are at high risk. Talk with your health care provider about your test results, treatment options, and if necessary, the need for more tests. Vaccines  Your health care provider may recommend certain vaccines, such as:  Influenza vaccine. This is recommended every year.  Tetanus, diphtheria, and acellular pertussis (Tdap, Td) vaccine. You may need a Td booster every 10 years.  Zoster vaccine. You may need this after age 40.  Pneumococcal 13-valent conjugate (PCV13) vaccine. One dose is recommended after age 61.  Pneumococcal polysaccharide (PPSV23) vaccine. One dose is recommended after age 34. Talk to your health care provider about which screenings and vaccines you need and how often you need them. This information is not intended to replace advice given to you by your health care provider. Make sure you discuss any questions you have with your health care provider. Document Released: 08/04/2015 Document Revised: 03/27/2016 Document Reviewed: 05/09/2015 Elsevier Interactive Patient Education  2017 [employer].  Fall Prevention in the Home Falls can cause injuries. They can happen to people of all ages. There are many things you can do to make your home safe and to help prevent falls. What can I do on the outside of my home?  Regularly fix the edges of walkways and driveways and fix any cracks.  Remove anything that might make you trip as you walk through a door, such as a raised step or threshold.  Trim any bushes or trees on the path to your home.  Use bright outdoor lighting.  Clear any walking paths of anything that might make someone trip, such as rocks or tools.  Regularly check to see if handrails are loose or broken. Make sure that both sides of any steps have handrails.  Any raised decks and porches should have guardrails on the edges.  Have any leaves, snow, or ice cleared regularly.  Use sand or salt on walking paths during  winter.  Clean up any spills in your garage right away. This includes oil or grease spills. What can I do in the bathroom?  Use night lights.  Install grab bars by the toilet and in the tub and shower. Do not use towel bars as grab bars.  Use non-skid mats or decals in the tub or shower.  If you need to sit down in the shower, use a plastic, non-slip stool.  Keep the floor dry. Clean up any water that spills on the floor as soon as it happens.  Remove soap buildup in the tub or shower regularly.  Attach bath mats securely with double-sided non-slip rug tape.  Do not have throw rugs and other things on the floor that can make you trip. What can I do in the bedroom?  Use night lights.  Make sure that you have a light by your bed that is easy to reach.  Do not use any sheets or blankets that are too big for your bed. They should not hang down onto the floor.  Have a firm chair that has side arms. You can use this for support while you get dressed.  Do not have throw rugs and other things on the floor that can make you trip. What can I do in the kitchen?  Clean up any spills right away.  Avoid walking on wet floors.  Keep items that you use a lot in easy-to-reach places.  If you need to reach something above you, use a strong step stool that has a grab bar.  Keep electrical cords out of the way.  Do not use floor polish or wax that makes floors slippery. If you must use wax, use non-skid floor wax.  Do not have throw rugs and other things on the floor that can make you trip. What can I do with my stairs?  Do not leave any items on the stairs.  Make sure that there are handrails on both sides of the stairs and use them. Fix handrails that are broken or loose. Make sure that handrails are as long as the stairways.  Check any carpeting to make sure that it is firmly attached to the stairs. Fix any carpet that is loose or worn.  Avoid having throw rugs at the top or  bottom of the stairs. If you do have throw rugs, attach them to the floor with carpet tape.  Make sure that you have a light switch at the top of the stairs and the bottom of the stairs. If you do not have them, ask someone to add them for you. What else can I do to help prevent falls?  Wear shoes that:  Do not have high heels.  Have rubber bottoms.  Are comfortable and fit you well.  Are closed at the toe. Do not wear sandals.  If you use a stepladder:  Make sure that it is fully opened. Do not climb a closed stepladder.  Make sure that both sides of the stepladder are locked into place.  Ask someone to hold it for you, if possible.  Clearly mark and make sure that you can see:  Any grab bars or handrails.  First and last steps.  Where the edge of each step is.  Use tools that help you move around (mobility aids) if they are needed. These include:  Canes.  Walkers.  Scooters.  Crutches.  Turn on the lights when you go into a dark area. Replace any light bulbs as soon as they burn out.  Set up your furniture so you have a clear path. Avoid moving your furniture around.  If any of your floors are uneven, fix them.  If there are any pets around you, be aware of where they are.  Review your medicines with your doctor. Some medicines can make you feel dizzy. This can increase your chance of falling. Ask your doctor what other things that you can do to help prevent falls. This information is not intended to replace advice given to you by your health care provider. Make sure you discuss any questions you have with your health care provider. Document Released: 05/04/2009 Document Revised: 12/14/2015 Document Reviewed: 08/12/2014 Elsevier Interactive Patient Education  2017 [employer].

## 2020-01-05 NOTE — Progress Notes (Addendum)
Subjective:   Preston Boyd is a 79 y.o. male who presents for Medicare Annual/Subsequent preventive examination.  Review of Systems:  No ROS. Medicare Wellness Visit Cardiac Risk Factors include: advanced age (>22men, >61 women);dyslipidemia;family history of premature cardiovascular disease;hypertension;male gender;obesity (BMI >30kg/m2)     Objective:    Vitals: BP 122/70   Pulse 61   Ht 6\' 1"  (1.854 m)   Wt 249 lb (112.9 kg)   SpO2 97%   BMI 32.85 kg/m   Body mass index is 32.85 kg/m.  Advanced Directives 01/05/2020 06/23/2018 06/19/2017 06/21/2015 06/18/2015 06/18/2015 06/14/2015  Does Patient Have a Medical Advance Directive? No No No No No No No  Would patient like information on creating a medical advance directive? No - Patient declined No - Patient declined Yes (ED - Information included in AVS) No - patient declined information - - No - patient declined information    Tobacco Social History   Tobacco Use  Smoking Status Former Smoker  . Quit date: 07/22/1962  . Years since quitting: 57.4  Smokeless Tobacco Never Used     Counseling given: No   Clinical Intake:  Pre-visit preparation completed: Yes  Pain : No/denies pain Pain Score: 0-No pain     BMI - recorded: 32.85 Nutritional Status: BMI > 30  Obese Nutritional Risks: None Diabetes: No  How often do you need to have someone help you when you read instructions, pamphlets, or other written materials from your doctor or pharmacy?: 1 - Never What is the last grade level you completed in school?: HSG  Interpreter Needed?: No  Information entered by :: Brighten Buzzelli N. Lowell Guitar, LPN  Past Medical History:  Diagnosis Date  . Acute cholecystitis s/p lap cholecystectomy 06/14/2015 06/13/2015  . BENIGN PROSTATIC HYPERTROPHY   . CAD (coronary artery disease) 01/2015   mild on cath 01/2015, med mgmt rec  . CKD (chronic kidney disease) stage 3, GFR 30-59 ml/min   . COPD (chronic obstructive pulmonary  disease) ([place])   . DEGENERATIVE JOINT DISEASE, RIGHT HIP    s/p THR 11/2008  . GERD   . History of cardiovascular stress test 01/2015   Lexiscan Myoview 7/16:  Inferoseptal, inferior and apical ischemia, EF 67%, TID 1.09; intermediate risk  . Hypertension   . MIGRAINE HEADACHE   . Osteoarthritis of knee    s/p B TKR 07/2009  . RENAL INSUFFICIENCY    Past Surgical History:  Procedure Laterality Date  . CARDIAC CATHETERIZATION N/A 02/15/2015   Procedure: Left Heart Cath and Coronary Angiography;  Surgeon: Troy Sine, MD;  Location: [facility];  Service: Cardiovascular;  Laterality: N/A;  . CATARACT EXTRACTION, BILATERAL  10/2012  . CHOLECYSTECTOMY N/A 06/14/2015   Procedure: LAPAROSCOPIC CHOLECYSTECTOMY WITH INTRAOPERATIVE CHOLANGIOGRAM;  Surgeon: Michael Boston, MD;  Location: [facility];  Service: General;  Laterality: N/A;  . INGUINAL HERNIA REPAIR Bilateral 1960's  . REPLACEMENT TOTAL KNEE BILATERAL  07/2009   graves  . TONSILLECTOMY    . TOTAL HIP ARTHROPLASTY  11/2008   Dr. Berenice Primas   Family History  Problem Relation Age of Onset  . Colon cancer Mother   . Lung cancer Father   . Stroke Paternal Grandfather   . Alcohol abuse Other   . Arthritis Other   . Diabetes Other   . Hypertension Other   . Heart disease Other   . Heart attack Maternal Uncle    Social History   Socioeconomic History  . Marital status: Single  Spouse name: Not on file  . Number of children: 0  . Years of education: 53  . Highest education level: Not on file  Occupational History  . Occupation: Retired  Tobacco Use  . Smoking status: Former Smoker    Quit date: 07/22/1962    Years since quitting: 57.4  . Smokeless tobacco: Never Used  Vaping Use  . Vaping Use: Never used  Substance and Sexual Activity  . Alcohol use: Yes    Alcohol/week: 0.0 standard drinks    Comment: ocassionally  . Drug use: No  . Sexual activity: Never  Other Topics Concern  . Not on file  Social History Narrative    Single, lives alone. Retired from 3rd shift [employer]. 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 Difficulty of Paying Living Expenses:   Food Insecurity:   . Worried About Programme researcher, broadcasting/film/video in the Last Year:   . Barista in the Last Year:   Transportation Needs:   . Freight forwarder (Medical):   Marland Kitchen Lack of Transportation (Non-Medical):   Physical Activity:   . Days of Exercise per Week:   . Minutes of Exercise per Session:   Stress:   . Feeling of Stress :   Social Connections:   . Frequency of Communication with Friends and Family:   . Frequency of Social Gatherings with Friends and Family:   . Attends Religious Services:   . Active Member of Clubs or Organizations:   . Attends Banker Meetings:   Marland Kitchen Marital Status:     Outpatient Encounter Medications as of 01/05/2020  Medication Sig  . acetaminophen (TYLENOL) 500 MG tablet Take 500 mg by mouth every 6 (six) hours as needed for moderate pain.   Marland Kitchen ADVAIR DISKUS 250-50 MCG/DOSE AEPB INHALE ONE PUFF INTO THE LUNGS  TWICE A DAY AS NEEDED FOR SHORTNESS OF BREATH  . aspirin EC 325 MG tablet Take 1 tablet (325 mg total) by mouth daily.  Marland Kitchen atorvastatin (LIPITOR) 40 MG tablet TAKE ONE TABLET BY MOUTH DAILY  . Flaxseed, Linseed, (FLAX SEED OIL PO) Take 15 mLs by mouth daily.  . GuaiFENesin (MUCINEX PO) Take 1 tablet by mouth as needed (CONGESTION).   Marland Kitchen metoprolol tartrate (LOPRESSOR) 25 MG tablet TAKE ONE-HALF TABLET BY MOUTH TWICE A DAY  . Olopatadine HCl (PAZEO) 0.7 % SOLN Place 1 drop into both eyes daily as needed (for dry eyes).  . Propylene Glycol (SYSTANE BALANCE OP) Place 1 drop into both eyes as needed (for dry eyes).   No facility-administered encounter medications on file as of 01/05/2020.    Activities of Daily Living In your present state of health, do you have any difficulty performing the following activities:  01/05/2020  Hearing? Y  Vision? N  Difficulty concentrating or making decisions? N  Walking or climbing stairs? N  Dressing or bathing? N  Doing errands, shopping? N  Preparing Food and eating ? N  Using the Toilet? N  In the past six months, have you accidently leaked urine? N  Do you have problems with loss of bowel control? N  Managing your Medications? N  Managing your Finances? N  Housekeeping or managing your Housekeeping? N  Some recent data might be hidden    Patient Care Team: Pincus Sanes, MD as PCP - General (Internal Medicine) Jodi Geralds, MD as Consulting Physician (Orthopedic Surgery) Estrella Deeds,  OD as Advice worker (Optometry) [place], [place] Lowella Bandy., MD as Consulting Physician (Urology) Lennette Bihari, MD (Cardiology) Charlott Rakes, MD as Consulting Physician (Gastroenterology) Kathyrn Sheriff, [facility] as Pharmacist (Pharmacist)   Assessment:   This is a routine wellness examination for [place].  Exercise Activities and Dietary recommendations Current Exercise Habits: The patient does not participate in regular exercise at present (no exercise but walks the dog), Exercise limited by: orthopedic condition(s);cardiac condition(s)  Goals    . Patient Stated     Stay as healthy and as independent as possible. 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       Fall Risk Fall Risk  01/05/2020 01/05/2020 06/23/2018 06/23/2018 06/19/2017  Falls in the past year? 0 0 0 0 No  Number falls in past yr: 0 0 - 0 -  Injury with Fall? 0 0 - - -  Risk for fall due to : No Fall Risks No Fall Risks - - -  Follow up Falls evaluation completed - - - -   Is the patient's home free of loose throw rugs in walkways, pet beds, electrical cords, etc?   yes      Grab bars in the bathroom? yes      Handrails on the stairs?   yes      Adequate lighting?   yes  Timed Get Up and Go Performed: Not indicated  Depression Screen PHQ 2/9 Scores  01/05/2020 06/23/2018 06/23/2018 06/19/2017  PHQ - 2 Score 0 0 0 0  PHQ- 9 Score - 0 - -    Cognitive Function MMSE - Mini Mental State Exam 06/19/2017  Orientation to time 5  Orientation to Place 5  Registration 3  Attention/ Calculation 4  Recall 2  Language- name 2 objects 2  Language- repeat 1  Language- follow 3 step command 3  Language- read & follow direction 1  Write a sentence 1  Copy design 1  Total score 28     6CIT Screen 01/05/2020  What Year? 0 points  What month? 0 points  What time? 0 points  Count back from 20 0 points  Months in reverse 0 points  Repeat phrase 2 points  Total Score 2    Immunization History  Administered Date(s) Administered  . Influenza Split 05/21/2012  . Influenza Whole 07/07/2009  . Influenza, High Dose Seasonal PF 05/18/2013, 06/12/2015, 06/17/2016, 06/19/2017, 06/23/2018, 05/10/2019  . Influenza,inj,Quad PF,6+ Mos 05/19/2014  . Pneumococcal Conjugate-13 05/19/2014  . Pneumococcal Polysaccharide-23 07/18/2016  . Td 07/07/2009  . Zoster 03/07/2011    Qualifies for Shingles Vaccine? Yes  Screening Tests Health Maintenance  Topic Date Due  . Hepatitis C Screening  Never done  . COVID-19 Vaccine (1) Never done  . TETANUS/TDAP  07/08/2019  . INFLUENZA VACCINE  02/20/2020  . PNA vac Low Risk Adult  Completed   Cancer Screenings: Lung: Low Dose CT Chest recommended if Age 12-80 years, 30 pack-year currently smoking OR have quit w/in 15years. Patient does not qualify. Colorectal: never done  Additional Screenings: Hepatitis C Screening: never done     Plan:     Reviewed health maintenance screenings with patient today and relevant education, vaccines, and/or referrals were provided.    Continue doing brain stimulating activities (puzzles, reading, adult coloring books, staying active) to keep memory sharp.    Continue to eat heart healthy diet (full of fruits, vegetables, whole grains, lean protein, water--limit salt, fat,  and sugar intake) and increase physical  activity as tolerated..  I have personally reviewed and noted the following in the patient's chart:   . Medical and social history . Use of alcohol, tobacco or illicit drugs  . Current medications and supplements . Functional ability and status . Nutritional status . Physical activity . Advanced directives . List of other physicians . Hospitalizations, surgeries, and ER visits in previous 12 months . Vitals . Screenings to include cognitive, depression, and falls . Referrals and appointments  In addition, I have reviewed and discussed with patient certain preventive protocols, quality metrics, and best practice recommendations. A written personalized care plan for preventive services as well as general preventive health recommendations were provided to patient.     Mickeal Needy, LPN  8/81/1031  Nurse Health Advisor    Medical screening examination/treatment/procedure(s) were performed by non-physician practitioner and as supervising physician I was immediately available for consultation/collaboration. I agree with above. Pincus Sanes, MD

## 2020-01-05 NOTE — Assessment & Plan Note (Signed)
Chronic cmp 

## 2020-01-05 NOTE — Assessment & Plan Note (Signed)
Chronic Check a1c Low sugar / carb diet Stressed regular exercise  

## 2020-01-05 NOTE — Assessment & Plan Note (Addendum)
Chronic Chronic cough, wheeze and SOB Taking advair daily  Symptoms controlled Trial of anoro to see if that helps more than advair Continue advair or anoro

## 2020-01-05 NOTE — Assessment & Plan Note (Signed)
Chronic BP well controlled Current regimen effective and well tolerated Continue current medications at current doses cmp  

## 2020-01-05 NOTE — Assessment & Plan Note (Signed)
Chronic On statin and lipids at goal  Continue statin

## 2020-01-26 DIAGNOSIS — H524 Presbyopia: Secondary | ICD-10-CM | POA: Diagnosis not present

## 2020-01-26 DIAGNOSIS — Z961 Presence of intraocular lens: Secondary | ICD-10-CM | POA: Diagnosis not present

## 2020-01-26 DIAGNOSIS — H43812 Vitreous degeneration, left eye: Secondary | ICD-10-CM | POA: Diagnosis not present

## 2020-01-26 DIAGNOSIS — H04123 Dry eye syndrome of bilateral lacrimal glands: Secondary | ICD-10-CM | POA: Diagnosis not present

## 2020-01-26 DIAGNOSIS — H26492 Other secondary cataract, left eye: Secondary | ICD-10-CM | POA: Diagnosis not present

## 2020-01-26 DIAGNOSIS — H0288A Meibomian gland dysfunction right eye, upper and lower eyelids: Secondary | ICD-10-CM | POA: Diagnosis not present

## 2020-01-26 DIAGNOSIS — H5213 Myopia, bilateral: Secondary | ICD-10-CM | POA: Diagnosis not present

## 2020-01-26 DIAGNOSIS — H0288B Meibomian gland dysfunction left eye, upper and lower eyelids: Secondary | ICD-10-CM | POA: Diagnosis not present

## 2020-01-26 DIAGNOSIS — H1045 Other chronic allergic conjunctivitis: Secondary | ICD-10-CM | POA: Diagnosis not present

## 2020-02-14 ENCOUNTER — Other Ambulatory Visit: Payer: Self-pay | Admitting: Internal Medicine

## 2020-02-24 NOTE — Chronic Care Management (AMB) (Deleted)
Chronic Care Management Pharmacy  Name: Preston Boyd  MRN: [id number] DOB: 1941-01-16   Chief Complaint/ HPI  Preston Boyd,  79 y.o. , male presents for their Initial CCM visit with the clinical pharmacist In office.  PCP : Binnie Rail, MD Patient Care Team: Binnie Rail, MD as PCP - General (Internal Medicine) Dorna Leitz, MD as Consulting Physician (Orthopedic Surgery) Sharol Roussel, [place] as Physician Assistant (Optometry) [place], [facility] Eduardo Osier., MD as Consulting Physician (Urology) Troy Sine, MD (Cardiology) Wilford Corner, MD as Consulting Physician (Gastroenterology) Charlton Haws, [facility] as Pharmacist (Pharmacist)  Their chronic conditions include: Hypertension, Hyperlipidemia, Coronary Artery Disease, COPD, Chronic Kidney Disease and Prediabetes, Migraines, Degenerative joint disease   Office Visits: 01/05/20 Dr Quay Burow OV: trial Anoro to see if it works better than Advair. Continue one or the other  Consult Visit: None in previous 6 months.  Allergies  Allergen Reactions  . Amoxicillin Other (See Comments)    Has patient had a PCN reaction causing immediate rash, facial/tongue/throat swelling, SOB or lightheadedness with hypotension:No Has patient had a PCN reaction causing severe rash involving mucus membranes or skin necrosis:No Has patient had a PCN reaction that required hospitalization:No Has patient had a PCN reaction occurring within the last 10 years:Yes If all of the above answers are "NO", then may proceed with Cephalosporin use. BODY ACHES ALL OVER      . Other Other (See Comments)    Amoxicillin > body aches all over per patient    Medications: Outpatient Encounter Medications as of 02/25/2020  Medication Sig  . acetaminophen (TYLENOL) 500 MG tablet Take 500 mg by mouth every 6 (six) hours as needed for moderate pain.   Marland Kitchen ADVAIR DISKUS 250-50 MCG/DOSE AEPB INHALE ONE PUFF INTO THE LUNGS  TWICE A DAY AS NEEDED FOR SHORTNESS OF  BREATH  . aspirin EC 325 MG tablet Take 1 tablet (325 mg total) by mouth daily.  Marland Kitchen atorvastatin (LIPITOR) 40 MG tablet TAKE ONE TABLET BY MOUTH DAILY  . Flaxseed, Linseed, (FLAX SEED OIL PO) Take 15 mLs by mouth daily.  . GuaiFENesin (MUCINEX PO) Take 1 tablet by mouth as needed (CONGESTION).   Marland Kitchen metoprolol tartrate (LOPRESSOR) 25 MG tablet TAKE 1/2 TABLET BY MOUTH TWICE A DAY  . Olopatadine HCl (PAZEO) 0.7 % SOLN Place 1 drop into both eyes daily as needed (for dry eyes).  . Propylene Glycol (SYSTANE BALANCE OP) Place 1 drop into both eyes as needed (for dry eyes).  Marland Kitchen umeclidinium-vilanterol (ANORO ELLIPTA) 62.5-25 MCG/INH AEPB Inhale 1 puff into the lungs daily.   No facility-administered encounter medications on file as of 02/25/2020.     Current Diagnosis/Assessment:    Goals Addressed   None     Hypertension   BP goal is:  <130/80  Office blood pressures are  BP Readings from Last 3 Encounters:  01/05/20 122/70  01/05/20 122/70  07/01/19 120/78   Kidney Function Lab Results  Component Value Date/Time   CREATININE 1.71 (H) 01/05/2020 08:58 AM   CREATININE 1.64 (H) 07/02/2019 07:42 AM   GFR 38.92 (L) 01/05/2020 08:58 AM   GFRNONAA 44 (L) 06/21/2015 10:05 AM   GFRAA 52 (L) 06/21/2015 10:05 AM   K 4.4 01/05/2020 08:58 AM   K 4.1 07/02/2019 07:42 AM   Patient checks BP at home {CHL HP BP Monitoring Frequency:[phone]} Patient home BP readings are ranging: ***   Patient has failed these meds in the past: lisinopril, HCTZ,  Patient  is currently {CHL Controlled/Uncontrolled:7148551061} on the following medications:  Marland Kitchen Metoprolol tartrate 25 mg - 1/2 tablet BID  We discussed {CHL HP Upstream Pharmacy discussion:2492362978}  Plan  Continue {CHL HP Upstream Pharmacy Plans:[phone]}     Hyperlipidemia   LDL goal < 70 CAD: 2016 - mild disease on cath, 2018 - seen on CT  Lipid Panel     Component Value Date/Time   CHOL 109 07/02/2019 0742   CHOL 162  12/02/2014 0806   TRIG 102.0 07/02/2019 0742   TRIG 124 12/02/2014 0806   HDL 42.20 07/02/2019 0742   HDL 36 (L) 12/02/2014 0806   LDLCALC 46 07/02/2019 0742   LDLCALC 101 (H) 12/02/2014 0806    Hepatic Function Latest Ref Rng & Units 01/05/2020 07/02/2019 12/24/2018  Total Protein 6.0 - 8.3 g/dL 6.6 7.0 7.7  Albumin 3.5 - 5.2 g/dL 4.3 4.2 4.6  AST 0 - 37 U/L _0 ALT 0 - 53 U/L _1 Alk Phosphatase 39 - 117 U/L 69 62 61  Total Bilirubin 0.2 - 1.2 mg/dL 1.2 0.9 1.4(H)  Bilirubin, Direct 0.0 - 0.3 mg/dL - - -     The ASCVD Risk score (Lamont., et al., 2013) failed to calculate for the following reasons:   The valid total cholesterol range is 130 to 320 mg/dL   Patient has failed these meds in past: *** Patient is currently {CHL Controlled/Uncontrolled:7148551061} on the following medications:  . Atorvastatin 40 mg daily . Aspirin 325 mg daily  We discussed:  {CHL HP Upstream Pharmacy discussion:2492362978}  Plan  Continue {CHL HP Upstream Pharmacy Plans:[phone]}   COPD   Last spirometry score: n/a  Gold Grade: unknown Current COPD Classification:  {CHL HP Upstream Pharm COPD Classification:[phone]}  Lab Results  Component Value Date/Time   EOSPCT 3.9 07/02/2019 07:42 AM   EOSABS 0.3 07/02/2019 07:42 AM    Patient has failed these meds in past: *** Patient is currently {CHL Controlled/Uncontrolled:7148551061} on the following medications:  . Anoro Ellipta 1 puff daily . Advair Diskus 250-50 1 puff BID  Using maintenance inhaler regularly? {yes/no:20286} Frequency of rescue inhaler use:  {CHL HP Upstream Pharm Inhaler PRXY:5859292446}  We discussed:  {CHL HP Upstream Pharmacy discussion:2492362978}  Plan  Continue {CHL HP Upstream Pharmacy Plans:[phone]}  Allergies / Dry eye   Patient has failed these meds in past: *** Patient is currently {CHL Controlled/Uncontrolled:7148551061} on the following medications:  . Olopatadine (Pazeo) 0.7%  eye drops PRN . Systane eye drops PRN  We discussed:  ***  Plan  Continue {CHL HP Upstream Pharmacy Plans:[phone]}  Prediabetes   A1c goal <6.5%  Recent Relevant Labs: Lab Results  Component Value Date/Time   HGBA1C 6.2 01/05/2020 08:58 AM   HGBA1C 6.1 07/02/2019 07:42 AM   GFR 38.92 (L) 01/05/2020 08:58 AM   GFR 40.90 (L) 07/02/2019 07:42 AM    Last diabetic Eye exam: No results found for: HMDIABEYEEXA  Last diabetic Foot exam: No results found for: HMDIABFOOTEX   Checking BG: {CHL HP Blood Glucose Monitoring Frequency:[phone]}  Recent FBG Readings: *** Recent pre-meal BG readings: *** Recent 2hr PP BG readings:  *** Recent HS BG readings: ***  Patient has failed these meds in past: *** Patient is currently {CHL Controlled/Uncontrolled:7148551061} on the following medications: . ***  We discussed: {CHL HP Upstream Pharmacy discussion:2492362978}  Plan  Continue {CHL HP Upstream Pharmacy Plans:[phone]}  Health Maintenance   Patient is currently {CHL Controlled/Uncontrolled:7148551061} on the following medications:  .  Tylenol 500 mg q6h PRN . Flaxseed oil   We discussed:  ***  Plan  Continue {CHL HP Upstream Pharmacy BMWUX:3244010272}   Medication Management   Pt uses [facility] for all medications Uses pill box? {Yes or If no, why not?:20788} Pt endorses ***% compliance  We discussed: ***  Plan  {US Pharmacy ZDGU:44034}    Follow up: *** month phone visit  ***

## 2020-02-24 NOTE — Addendum Note (Signed)
Addended by: Verlan Friends on: 02/24/2020 04:29 PM   Modules accepted: Orders

## 2020-02-25 ENCOUNTER — Ambulatory Visit: Payer: Medicare HMO

## 2020-02-29 ENCOUNTER — Telehealth: Payer: Self-pay | Admitting: Internal Medicine

## 2020-02-29 NOTE — Progress Notes (Signed)
  Chronic Care Management   Outreach Note  02/29/2020 Name: Preston Boyd MRN: [id number] DOB: Jun 08, 1941  Referred by: Pincus Sanes, MD Reason for referral : No chief complaint on file.   An unsuccessful telephone outreach was attempted today. The patient was referred to the pharmacist for assistance with care management and care coordination.   Follow Up Plan:   Lynnae January Upstream Scheduler

## 2020-03-02 ENCOUNTER — Telehealth: Payer: Self-pay | Admitting: Internal Medicine

## 2020-03-02 ENCOUNTER — Other Ambulatory Visit: Payer: Self-pay | Admitting: Internal Medicine

## 2020-03-02 NOTE — Progress Notes (Signed)
  Chronic Care Management   Outreach Note  03/02/2020 Name: DEMARRIO MENGES MRN: [id number] DOB: Nov 25, 1940  Referred by: Pincus Sanes, MD Reason for referral : No chief complaint on file.   An unsuccessful telephone outreach was attempted today. The patient was referred to the pharmacist for assistance with care management and care coordination.   Follow Up Plan:   Lynnae January Upstream Scheduler

## 2020-03-14 ENCOUNTER — Other Ambulatory Visit: Payer: Self-pay | Admitting: Internal Medicine

## 2020-03-21 ENCOUNTER — Telehealth: Payer: Self-pay | Admitting: Pharmacist

## 2020-03-21 NOTE — Progress Notes (Signed)
    Chronic Care Management Pharmacy Assistant   Name: Preston Boyd  MRN: [id number] DOB: 24-Sep-1940  Reason for Encounter: General Adherence Call   PCP : Pincus Sanes, MD  Allergies:   Allergies  Allergen Reactions  . Amoxicillin Other (See Comments)    Has patient had a PCN reaction causing immediate rash, facial/tongue/throat swelling, SOB or lightheadedness with hypotension:No Has patient had a PCN reaction causing severe rash involving mucus membranes or skin necrosis:No Has patient had a PCN reaction that required hospitalization:No Has patient had a PCN reaction occurring within the last 10 years:Yes If all of the above answers are "NO", then may proceed with Cephalosporin use. BODY ACHES ALL OVER      . Other Other (See Comments)    Amoxicillin > body aches all over per patient    Medications: Outpatient Encounter Medications as of 03/21/2020  Medication Sig  . atorvastatin (LIPITOR) 40 MG tablet TAKE ONE TABLET BY MOUTH DAILY  . acetaminophen (TYLENOL) 500 MG tablet Take 500 mg by mouth every 6 (six) hours as needed for moderate pain.   Marland Kitchen ADVAIR DISKUS 250-50 MCG/DOSE AEPB INHALE 1 PUFF BY MOUTH INTO THE LUNGS TWICE DAILY AS NEEDED FOR SHORTNESS OF BREATH  . aspirin EC 325 MG tablet Take 1 tablet (325 mg total) by mouth daily.  . Flaxseed, Linseed, (FLAX SEED OIL PO) Take 15 mLs by mouth daily.  . GuaiFENesin (MUCINEX PO) Take 1 tablet by mouth as needed (CONGESTION).   Marland Kitchen metoprolol tartrate (LOPRESSOR) 25 MG tablet TAKE 1/2 TABLET BY MOUTH TWICE A DAY  . Olopatadine HCl (PAZEO) 0.7 % SOLN Place 1 drop into both eyes daily as needed (for dry eyes).  . Propylene Glycol (SYSTANE BALANCE OP) Place 1 drop into both eyes as needed (for dry eyes).  Marland Kitchen umeclidinium-vilanterol (ANORO ELLIPTA) 62.5-25 MCG/INH AEPB Inhale 1 puff into the lungs daily.   No facility-administered encounter medications on file as of 03/21/2020.    Current Diagnosis: Patient Active Problem  List   Diagnosis Date Noted  . Snores 12/24/2018  . Chronic right flank pain 12/18/2017  . Hearing loss 06/19/2017  . Ventral hernia 06/19/2017  . Prediabetes 06/18/2017  . Aortic atherosclerosis (HCC) 04/03/2017  . Left inguinal hernia 04/03/2017  . CAD (coronary artery disease) 01/20/2015  . Hyperlipidemia 12/05/2014  . CKD (chronic kidney disease) stage 3, GFR 30-59 ml/min   . COPD (chronic obstructive pulmonary disease) (HCC)   . MIGRAINE HEADACHE 07/07/2009  . Hypertension 07/07/2009  . Nodular hyperplasia of prostate gland 07/07/2009  . DEGENERATIVE JOINT DISEASE, RIGHT HIP 07/07/2009    Goals Addressed   None     Follow-Up:  Pharmacist Review     Per Clinical Pharmacist the patient missed his initial appointment. I was instructed to call the patient to review his medications to see if he has anything new with his health that he would like to discuss with the clinical pharmacist. The patient stated that he is doing well with his medications and right now he is doing ok with his health but he will speak with Clinical pharmacist Mardella Layman on 04/27/20.   Fredric Mare, CMA  Clinical Pharmacist Assistant  [phone]

## 2020-04-25 ENCOUNTER — Telehealth: Payer: Self-pay | Admitting: Pharmacist

## 2020-04-25 NOTE — Progress Notes (Signed)
A user error has taken place: encounter opened in error, closed for administrative reasons.

## 2020-04-26 NOTE — Chronic Care Management (AMB) (Signed)
Chronic Care Management Pharmacy  Name: ANTAVION BARTOSZEK  MRN: [id number] DOB: 1941/03/16   Chief Complaint/ HPI  Preston Boyd,  79 y.o. , male presents for their Initial CCM visit with the clinical pharmacist In office.  PCP : Binnie Rail, MD Patient Care Team: Binnie Rail, MD as PCP - General (Internal Medicine) Dorna Leitz, MD as Consulting Physician (Orthopedic Surgery) Sharol Roussel, [place] as Physician Assistant (Optometry) [place], [facility] Eduardo Osier., MD as Consulting Physician (Urology) Troy Sine, MD (Cardiology) Wilford Corner, MD as Consulting Physician (Gastroenterology) Charlton Haws, [facility] as Pharmacist (Pharmacist)  Their chronic conditions include: Hypertension, Hyperlipidemia, Coronary Artery Disease, COPD and Chronic Kidney Disease, Migraines, DJD, Prediabetes  Patient lives alone with his dog. He spends most days reading and walking his dog 3 times a day. He has a good relationship with his neighbor.  Office Visits: 01/05/20 Dr Quay Burow OV: trial Anoro to see if it helps more than Advair  Consult Visit: 01/26/20 Dr Katy Fitch (ophthalmology): f/u for allergic conjunctivitis  Allergies  Allergen Reactions  . Amoxicillin Other (See Comments)    Has patient had a PCN reaction causing immediate rash, facial/tongue/throat swelling, SOB or lightheadedness with hypotension:No Has patient had a PCN reaction causing severe rash involving mucus membranes or skin necrosis:No Has patient had a PCN reaction that required hospitalization:No Has patient had a PCN reaction occurring within the last 10 years:Yes If all of the above answers are "NO", then may proceed with Cephalosporin use. BODY ACHES ALL OVER      . Other Other (See Comments)    Amoxicillin > body aches all over per patient    Medications: Outpatient Encounter Medications as of 04/27/2020  Medication Sig  . acetaminophen (TYLENOL) 500 MG tablet Take 500 mg by mouth every 6 (six) hours as  needed for moderate pain.   Marland Kitchen ADVAIR DISKUS 250-50 MCG/DOSE AEPB INHALE 1 PUFF BY MOUTH INTO THE LUNGS TWICE DAILY AS NEEDED FOR SHORTNESS OF BREATH  . aspirin EC 325 MG tablet Take 1 tablet (325 mg total) by mouth daily.  Marland Kitchen atorvastatin (LIPITOR) 40 MG tablet TAKE ONE TABLET BY MOUTH DAILY  . Flaxseed, Linseed, (FLAX SEED OIL PO) Take 15 mLs by mouth daily.  . GuaiFENesin (MUCINEX PO) Take 1 tablet by mouth as needed (CONGESTION).   Marland Kitchen metoprolol tartrate (LOPRESSOR) 25 MG tablet TAKE 1/2 TABLET BY MOUTH TWICE A DAY  . Olopatadine HCl (PAZEO) 0.7 % SOLN Place 1 drop into both eyes daily as needed (for dry eyes).  . Propylene Glycol (SYSTANE BALANCE OP) Place 1 drop into both eyes as needed (for dry eyes).  Marland Kitchen umeclidinium-vilanterol (ANORO ELLIPTA) 62.5-25 MCG/INH AEPB Inhale 1 puff into the lungs daily. (Patient not taking: Reported on 04/27/2020)   No facility-administered encounter medications on file as of 04/27/2020.    Wt Readings from Last 3 Encounters:  01/05/20 249 lb (112.9 kg)  01/05/20 249 lb (112.9 kg)  07/01/19 252 lb 1.9 oz (114.4 kg)    Current Diagnosis/Assessment:  SDOH Interventions     Most Recent Value  SDOH Interventions  Financial Strain Interventions Intervention Not Indicated      Goals Addressed            This Visit's Progress   . Pharmacy Care Plan       CARE PLAN ENTRY (see longitudinal plan of care for additional care plan information)  Current Barriers:  . Chronic Disease Management support, education, and care coordination needs related  to Hypertension, Hyperlipidemia, Coronary Artery Disease, and COPD   Hypertension BP Readings from Last 3 Encounters:  01/05/20 122/70  01/05/20 122/70  07/01/19 120/78 .  Pharmacist Clinical Goal(s): o Over the next 180 days, patient will work with PharmD and providers to maintain BP goal <130/80 . Current regimen:  o Metoprolol tartrate 25 mg - 1/2 tablet twice daily . Interventions: o Discussed BP  goals and benefits of medications for prevention of heart attack / stroke . Patient self care activities - Over the next 180 days, patient will: o Check BP daily, document, and provide at future appointments o Ensure daily salt intake < 2300 mg/day  Hyperlipidemia / CAD Lab Results  Component Value Date/Time   LDLCALC 46 07/02/2019 07:42 AM   LDLCALC 101 (H) 12/02/2014 08:06 AM .  Pharmacist Clinical Goal(s): o Over the next 180 days, patient will work with PharmD and providers to maintain LDL goal < 70 . Current regimen:  o Atorvastatin 40 mg daily o Aspirin 325 mg daily . Interventions: o Discussed cholesterol goals and benefits of medications for prevention of heart attack / stroke . Patient self care activities - Over the next 180 days, patient will: o Continue current medications and lifestyle  COPD . Pharmacist Clinical Goal(s) o Over the next 30 days, patient will work with PharmD and providers to optimize therapy . Current regimen:  . Anoro Ellipta 1 puff daily (not filled yet) . Advair Diskus 250-50 1 puff BID . Interventions: o Discussed benefits of Anoro over Advair including once daily dosing  o Contacted patient's pharmacy to ensure medication is covered - Tier 3 $47/month . Patient self care activities - Over the next 30 days, patient will: o Try Anoro instead of Advair to see if symptoms improve  Medication management . Pharmacist Clinical Goal(s): o Over the next 180 days, patient will work with PharmD and providers to maintain optimal medication adherence . [facility] . Interventions o Comprehensive medication review performed. o Continue current medication management strategy . Patient self care activities - Over the next 180 days, patient will: o Focus on medication adherence by fill date o Take medications as prescribed o Report any questions or concerns to PharmD and/or provider(s)  Initial goal documentation        Hypertension   BP goal is:  <130/80  Office blood pressures are  BP Readings from Last 3 Encounters:  01/05/20 122/70  01/05/20 122/70  07/01/19 120/78   Pulse Readings from Last 3 Encounters:  01/05/20 61  01/05/20 61  07/01/19 62   Kidney Function Lab Results  Component Value Date/Time   CREATININE 1.71 (H) 01/05/2020 08:58 AM   CREATININE 1.64 (H) 07/02/2019 07:42 AM   GFR 38.92 (L) 01/05/2020 08:58 AM   GFRNONAA 44 (L) 06/21/2015 10:05 AM   GFRAA 52 (L) 06/21/2015 10:05 AM   K 4.4 01/05/2020 08:58 AM   K 4.1 07/02/2019 07:42 AM   Patient checks BP at home daily Patient home BP readings are ranging: SBP 110-120  Patient has failed these meds in the past: lisinopril- HCTZ, Patient is currently controlled on the following medications:  Marland Kitchen Metoprolol tartrate 25 mg - 1/2 tab BID  We discussed diet and exercise extensively; BP goals; benefits of medications; pt had dizziness with lisinoprol/HCTZ but denies issues with metoprolol  Plan  Continue current medications   Hyperlipidemia   LDL goal < 70 Hx CAD - mild per Cath 2016  Lipid Panel  Component Value Date/Time   CHOL 109 07/02/2019 0742   CHOL 162 12/02/2014 0806   TRIG 102.0 07/02/2019 0742   TRIG 124 12/02/2014 0806   HDL 42.20 07/02/2019 0742   HDL 36 (L) 12/02/2014 0806   LDLCALC 46 07/02/2019 0742   LDLCALC 101 (H) 12/02/2014 0806    Hepatic Function Latest Ref Rng & Units 01/05/2020 07/02/2019 12/24/2018  Total Protein 6.0 - 8.3 g/dL 6.6 7.0 7.7  Albumin 3.5 - 5.2 g/dL 4.3 4.2 4.6  AST 0 - 37 U/L '16 16 17  ' ALT 0 - 53 U/L '19 21 21  ' Alk Phosphatase 39 - 117 U/L 69 62 61  Total Bilirubin 0.2 - 1.2 mg/dL 1.2 0.9 1.4(H)  Bilirubin, Direct 0.0 - 0.3 mg/dL - - -    The ASCVD Risk score ([place]., et al., 2013) failed to calculate for the following reasons:   The valid total cholesterol range is 130 to 320 mg/dL   Patient has failed these meds in past: n/a Patient is currently controlled on  the following medications:  . Atorvastatin 40 mg daily . Aspirin 325 mg daily  We discussed:  diet and exercise extensively; Cholesterol goals; benefits of statin for ASCVD risk reduction; pt reports he also uses aspirin for pain management benefits; he denies s/sx of bleeding other than from occasional cuts/scrapes  Plan  Continue current medications and control with diet and exercise   COPD   Last spirometry score: not on file  Gold Grade: unknown Current COPD Classification:  unknown  Lab Results  Component Value Date/Time   EOSPCT 3.9 07/02/2019 07:42 AM   EOSABS 0.3 07/02/2019 07:42 AM   Patient has failed these meds in past: Anoro Patient is currently uncontrolled on the following medications:  . Advair (fluticasone/salmetrol) 250-50 mcg/dose 1 puff BID . Anoro (umeclidinium/vilatnerol) 62.5-25 mcg/dose 1 puff daily (never filled)  Using maintenance inhaler regularly? Yes - Advair once daily Frequency of rescue inhaler use:  never  We discussed:  proper inhaler technique; pt has been using Advair once a day at bedtime; he endorses shortness of breath usually occurs overnight; he never picked up Anoro due to insurance issues; per formulary Anoro is covered on the same tier as Advair; contacted pharmacy and verified this - pt is willing to try Anoro again given it is same price as Advair  Preferred inhalers per formulary: -Advair, Breo (Tier 3, $47/month) -Anoro, Bevespi (Tier 3, $47/month) -Trelegy (Tier 3, $47/month)  Plan  Recommend trial of Anoro Ellipta as previously prescribed by PCP  Osteoarthritis   Patient has failed these meds in past: n/a Patient is currently controlled on the following medications:  . Tylenol 500 mg PRN  We discussed:  Patient is satisfied with current regimen and denies issues; pt reports he takes Tylenol at bedtime most days since that is when arthritis pain bothers him most  Plan  Continue current medications  Health  maintenance   Patient has failed these meds in past: n/a Patient is currently controlled on the following medications:  . Flaxseed oil . Mucinex PRN . Pazeo 0.7% eye drops PRN . Systane eye drops PRN  We discussed:  Patient is satisfied with current regimen and denies issues  Plan  Continue current medications  Medication Management   Pt uses [facility] for all medications Uses pill box? No - prefers bottles Pt endorses 100% compliance  We discussed: Current pharmacy is preferred with insurance plan and patient is satisfied with pharmacy services; pt  is also planning to meet with health insurance agent to optimize his plan for 2022 - he would like hearing aids to be covered  Plan  Continue current medication management strategy    Follow up: 1 month phone visit  Charlene Brooke, PharmD, BCACP Clinical Pharmacist [facility] at [facility] [phone]

## 2020-04-27 ENCOUNTER — Other Ambulatory Visit: Payer: Self-pay

## 2020-04-27 ENCOUNTER — Ambulatory Visit: Payer: Medicare HMO | Admitting: Pharmacist

## 2020-04-27 DIAGNOSIS — E7849 Other hyperlipidemia: Secondary | ICD-10-CM

## 2020-04-27 DIAGNOSIS — I10 Essential (primary) hypertension: Secondary | ICD-10-CM

## 2020-04-27 DIAGNOSIS — I2510 Atherosclerotic heart disease of native coronary artery without angina pectoris: Secondary | ICD-10-CM

## 2020-04-27 DIAGNOSIS — J449 Chronic obstructive pulmonary disease, unspecified: Secondary | ICD-10-CM

## 2020-04-27 NOTE — Patient Instructions (Addendum)
Visit Information  Phone number for Pharmacist: [phone]  Thank you for meeting with me to discuss your medications! I look forward to working with you to achieve your health care goals. Below is a summary of what we talked about during the visit:  Goals Addressed            This Visit's Progress   . Pharmacy Care Plan       CARE PLAN ENTRY (see longitudinal plan of care for additional care plan information)  Current Barriers:  . Chronic Disease Management support, education, and care coordination needs related to Hypertension, Hyperlipidemia, Coronary Artery Disease, and COPD   Hypertension BP Readings from Last 3 Encounters:  01/05/20 122/70  01/05/20 122/70  07/01/19 120/78 .  Pharmacist Clinical Goal(s): o Over the next 180 days, patient will work with PharmD and providers to maintain BP goal <130/80 . Current regimen:  o Metoprolol tartrate 25 mg - 1/2 tablet twice daily . Interventions: o Discussed BP goals and benefits of medications for prevention of heart attack / stroke . Patient self care activities - Over the next 180 days, patient will: o Check BP daily, document, and provide at future appointments o Ensure daily salt intake < 2300 mg/day  Hyperlipidemia / CAD Lab Results  Component Value Date/Time   LDLCALC 46 07/02/2019 07:42 AM   LDLCALC 101 (H) 12/02/2014 08:06 AM .  Pharmacist Clinical Goal(s): o Over the next 180 days, patient will work with PharmD and providers to maintain LDL goal < 70 . Current regimen:  o Atorvastatin 40 mg daily o Aspirin 325 mg daily . Interventions: o Discussed cholesterol goals and benefits of medications for prevention of heart attack / stroke . Patient self care activities - Over the next 180 days, patient will: o Continue current medications and lifestyle  COPD . Pharmacist Clinical Goal(s) o Over the next 30 days, patient will work with PharmD and providers to optimize therapy . Current regimen:  . Anoro Ellipta  1 puff daily (not filled yet) . Advair Diskus 250-50 1 puff BID . Interventions: o Discussed benefits of Anoro over Advair including once daily dosing  o Contacted patient's pharmacy to ensure medication is covered - Tier 3 $47/month . Patient self care activities - Over the next 30 days, patient will: o Try Anoro instead of Advair to see if symptoms improve  Medication management . Pharmacist Clinical Goal(s): o Over the next 180 days, patient will work with PharmD and providers to maintain optimal medication adherence . [facility] . Interventions o Comprehensive medication review performed. o Continue current medication management strategy . Patient self care activities - Over the next 180 days, patient will: o Focus on medication adherence by fill date o Take medications as prescribed o Report any questions or concerns to PharmD and/or provider(s)  Initial goal documentation      Preston Boyd was given information about Chronic Care Management services today including:  1. CCM service includes personalized support from designated clinical staff supervised by his physician, including individualized plan of care and coordination with other care providers 2. 24/7 contact phone numbers for assistance for urgent and routine care needs. 3. Standard insurance, coinsurance, copays and deductibles apply for chronic care management only during months in which we provide at least 20 minutes of these services. Most insurances cover these services at 100%, however patients may be responsible for any copay, coinsurance and/or deductible if applicable. This service may help you avoid the need for  more expensive face-to-face services. 4. Only one practitioner may furnish and bill the service in a calendar month. 5. The patient may stop CCM services at any time (effective at the end of the month) by phone call to the office staff.  Patient agreed to services and verbal  consent obtained.   Patient verbalizes understanding of instructions provided today.  Telephone follow up appointment with pharmacy team member scheduled for: 1 month  Al Corpus, PharmD, BCACP Clinical Pharmacist [facility] [phone]  How to Use a Dry Powder Inhaler  A dry powder inhaler (DPI) is a handheld device for taking medicine that must be breathed into the lungs (inhaled). You get one dose each time you inhale the medicine. Most adults use an inhaler two times each day. Your health care provider will tell you how often to use your DPI. You may need this inhaler as part of your long-term treatment for asthma or chronic obstructive lung (pulmonary) disease (COPD). Dry powder inhalers are not for sudden asthma symptoms. The inhaler contains medicine to decrease swelling in your airways (corticosteroids). There are several types of dry powder inhalers. They include:  Disc inhalers. These are shaped like a disc. The medicine is loaded when you slide the lever.  Inhalers that twist. The medicine is loaded when you twist the inhaler. Follow the directions for use from your health care provider. Refer to the package insert that came with your inhaler for specific instructions. What are the risks? If you do not use your inhaler correctly, medicine might not reach your lungs to help you breathe. Inhaler medicine can cause side effects, such as:  Mouth infection.  Throat infection.  Cough.  Hoarseness.  Headache.  Nausea and vomiting.  Lung infection (pneumonia) in people with COPD. Supplies needed: The medicine that you will need comes in the inhaler. Each device contains the amount of medicine needed for a set number of uses (inhalations). The inhaler is the only supply that you need to inhale your medicine. How to use a dry powder inhaler 1. Make sure you remove any gum or candy from your mouth. 2. Stand or sit up straight. 3. Activate  the medicine in your inhaler, depending on the type of DPI you have. 4. Turn your head and breathe out. Do not breathe out into the mouthpiece. 5. Turn your head back to the mouthpiece and seal your lips around it. 6. Take a deep breath through your mouth. Do not breathe through your nose. 7. Hold your breath for 10 seconds. 8. Remove the inhaler from your mouth, turn your head, and breathe out slowly. 9. You may not feel the medicine going into your lungs. This is normal. 10. Check the dose indicator number on the inhaler. It should go down when you use it. 11. Rinse your mouth with water. Do not swallow the water. Follow these instructions at home:  Take your inhaled medicine only as told by your health care provider.  Keep all follow-up visits as told by your health care provider. This is important. General recommendations  Do not drop or shake your inhaler.  Do not wash your inhaler. If the mouthpiece gets dirty, use a dry cloth to wipe it clean. The medicine in your inhaler is in powder form and needs to be kept dry.  Do not breathe into the inhaler.  Store your inhaler in a dry place at room temperature. Do not store it in your bathroom.  Keep track of your  doses. When the dose counter is low, it is time to pick up a new inhaler at your pharmacy. When the dose number shows zero (0), throw the inhaler away. Contact a health care provider if:  You have a sore mouth.  You have a sore throat.  You have a persistent cough.  Your voice changes or gets hoarse.  You have a fever.  You have frequent headaches, nausea, or vomiting after starting to use your inhaler.  Your asthma or COPD symptoms have not improved after you have been using your inhaler for a few weeks. Get help right away if:  You have severe shortness of breath.  You have difficulty breathing. Summary  A dry powder inhaler (DPI) is a handheld device that contains medicine that should help you breathe  better.  Your health care provider will prescribe the strength of the medicine and how often you inhale it. This varies from person to person.  Carefully follow instructions from your health care provider about using your inhaler.  Keep your inhaler dry. The medicine in the device is a powder and must be kept dry.  If you have any questions about how to use your inhaler, talk with your health care provider or pharmacist. This information is not intended to replace advice given to you by your health care provider. Make sure you discuss any questions you have with your health care provider. Document Revised: 01/06/2018 Document Reviewed: 01/31/2016 Elsevier Patient Education  2020 [employer].

## 2020-05-06 DIAGNOSIS — R69 Illness, unspecified: Secondary | ICD-10-CM | POA: Diagnosis not present

## 2020-05-13 ENCOUNTER — Other Ambulatory Visit: Payer: Self-pay | Admitting: Internal Medicine

## 2020-05-22 ENCOUNTER — Ambulatory Visit: Payer: Medicare HMO | Admitting: Pharmacist

## 2020-05-22 ENCOUNTER — Other Ambulatory Visit: Payer: Self-pay

## 2020-05-22 DIAGNOSIS — I10 Essential (primary) hypertension: Secondary | ICD-10-CM

## 2020-05-22 DIAGNOSIS — I2510 Atherosclerotic heart disease of native coronary artery without angina pectoris: Secondary | ICD-10-CM

## 2020-05-22 DIAGNOSIS — J449 Chronic obstructive pulmonary disease, unspecified: Secondary | ICD-10-CM

## 2020-05-22 DIAGNOSIS — E7849 Other hyperlipidemia: Secondary | ICD-10-CM

## 2020-05-22 NOTE — Chronic Care Management (AMB) (Signed)
Chronic Care Management Pharmacy  Name: Preston Boyd  MRN: [id number] DOB: June 08, 1941   Chief Complaint/ HPI  Preston Boyd,  79 y.o. , male presents for their Follow-Up CCM visit with the clinical pharmacist via telephone due to COVID-19 Pandemic.  PCP : Binnie Rail, MD Patient Care Team: Binnie Rail, MD as PCP - General (Internal Medicine) Dorna Leitz, MD as Consulting Physician (Orthopedic Surgery) Sharol Roussel, [place] as Physician Assistant (Optometry) Duncan, [facility] Eduardo Osier., MD as Consulting Physician (Urology) Troy Sine, MD (Cardiology) Wilford Corner, MD as Consulting Physician (Gastroenterology) Charlton Haws, [facility] as Pharmacist (Pharmacist)  Their chronic conditions include: Hypertension, Hyperlipidemia, Coronary Artery Disease, COPD and Chronic Kidney Disease, Migraines, DJD, Prediabetes  Patient lives alone with his dog. He spends most days reading and walking his dog 3 times a day. He has a good relationship with his neighbor.  Office Visits: 01/05/20 Dr Quay Burow OV: trial Anoro to see if it helps more than Advair  Consult Visit: 01/26/20 Dr Katy Fitch (ophthalmology): f/u for allergic conjunctivitis  Allergies  Allergen Reactions  . Amoxicillin Other (See Comments)    Has patient had a PCN reaction causing immediate rash, facial/tongue/throat swelling, SOB or lightheadedness with hypotension:No Has patient had a PCN reaction causing severe rash involving mucus membranes or skin necrosis:No Has patient had a PCN reaction that required hospitalization:No Has patient had a PCN reaction occurring within the last 10 years:Yes If all of the above answers are "NO", then may proceed with Cephalosporin use. BODY ACHES ALL OVER      . Other Other (See Comments)    Amoxicillin > body aches all over per patient    Medications: Outpatient Encounter Medications as of 05/22/2020  Medication Sig  . acetaminophen (TYLENOL) 500 MG tablet Take 500 mg by  mouth every 6 (six) hours as needed for moderate pain.   Marland Kitchen aspirin EC 325 MG tablet Take 1 tablet (325 mg total) by mouth daily.  Marland Kitchen atorvastatin (LIPITOR) 40 MG tablet TAKE ONE TABLET BY MOUTH DAILY  . Flaxseed, Linseed, (FLAX SEED OIL PO) Take 15 mLs by mouth daily.  . GuaiFENesin (MUCINEX PO) Take 1 tablet by mouth as needed (CONGESTION).   Marland Kitchen metoprolol tartrate (LOPRESSOR) 25 MG tablet Take 0.5 tablets (12.5 mg total) by mouth 2 (two) times daily. Annual appt due in Dec must see provider for future refills  . Olopatadine HCl (PAZEO) 0.7 % SOLN Place 1 drop into both eyes daily as needed (for dry eyes).  . Propylene Glycol (SYSTANE BALANCE OP) Place 1 drop into both eyes as needed (for dry eyes).  Marland Kitchen umeclidinium-vilanterol (ANORO ELLIPTA) 62.5-25 MCG/INH AEPB Inhale 1 puff into the lungs daily.  Marland Kitchen ADVAIR DISKUS 250-50 MCG/DOSE AEPB INHALE 1 PUFF BY MOUTH INTO THE LUNGS TWICE DAILY AS NEEDED FOR SHORTNESS OF BREATH (Patient not taking: Reported on 05/22/2020)   No facility-administered encounter medications on file as of 05/22/2020.    Wt Readings from Last 3 Encounters:  01/05/20 249 lb (112.9 kg)  01/05/20 249 lb (112.9 kg)  07/01/19 252 lb 1.9 oz (114.4 kg)    Current Diagnosis/Assessment:    Goals Addressed            This Visit's Progress   . Pharmacy Care Plan   On track    CARE PLAN ENTRY (see longitudinal plan of care for additional care plan information)  Current Barriers:  . Chronic Disease Management support, education, and care coordination needs related to Hypertension,  Hyperlipidemia, Coronary Artery Disease, and COPD   Hypertension BP Readings from Last 3 Encounters:  01/05/20 122/70  01/05/20 122/70  07/01/19 120/78 .  Pharmacist Clinical Goal(s): o Over the next 180 days, patient will work with PharmD and providers to maintain BP goal <130/80 . Current regimen:  o Metoprolol tartrate 25 mg - 1/2 tablet twice daily . Interventions: o Discussed BP goals  and benefits of medications for prevention of heart attack / stroke . Patient self care activities - Over the next 180 days, patient will: o Check BP daily, document, and provide at future appointments o Ensure daily salt intake < 2300 mg/day  Hyperlipidemia / CAD Lab Results  Component Value Date/Time   LDLCALC 46 07/02/2019 07:42 AM   LDLCALC 101 (H) 12/02/2014 08:06 AM .  Pharmacist Clinical Goal(s): o Over the next 180 days, patient will work with PharmD and providers to maintain LDL goal < 70 . Current regimen:  o Atorvastatin 40 mg daily o Aspirin 325 mg daily . Interventions: o Discussed cholesterol goals and benefits of medications for prevention of heart attack / stroke . Patient self care activities - Over the next 180 days, patient will: o Continue current medications and lifestyle  COPD . Pharmacist Clinical Goal(s) o Over the next 30 days, patient will work with PharmD and providers to optimize therapy . Current regimen:  . Anoro Ellipta 1 puff daily . Interventions: o Discussed benefits of Anoro over Advair including once daily dosing  o Contacted patient's pharmacy to ensure medication is covered - Tier 3 $47/month . Patient self care activities - Over the next 30 days, patient will: o Continue Anoro for at least a month, continue if symptoms improve over Advair  Medication management . Pharmacist Clinical Goal(s): o Over the next 180 days, patient will work with PharmD and providers to maintain optimal medication adherence . [facility] . Interventions o Comprehensive medication review performed. o Continue current medication management strategy . Patient self care activities - Over the next 180 days, patient will: o Focus on medication adherence by fill date o Take medications as prescribed o Report any questions or concerns to PharmD and/or provider(s)  Please see past updates related to this goal by clicking on the "Past  Updates" button in the selected goal        Hypertension   BP goal is:  <130/80  Office blood pressures are  BP Readings from Last 3 Encounters:  01/05/20 122/70  01/05/20 122/70  07/01/19 120/78   Pulse Readings from Last 3 Encounters:  01/05/20 61  01/05/20 61  07/01/19 62   Kidney Function Lab Results  Component Value Date/Time   CREATININE 1.71 (H) 01/05/2020 08:58 AM   CREATININE 1.64 (H) 07/02/2019 07:42 AM   GFR 38.92 (L) 01/05/2020 08:58 AM   GFRNONAA 44 (L) 06/21/2015 10:05 AM   GFRAA 52 (L) 06/21/2015 10:05 AM   K 4.4 01/05/2020 08:58 AM   K 4.1 07/02/2019 07:42 AM   Patient checks BP at home daily Patient home BP readings are ranging: SBP 110-120  Patient has failed these meds in the past: lisinopril- HCTZ, Patient is currently controlled on the following medications:  Marland Kitchen Metoprolol tartrate 25 mg - 1/2 tab BID  We discussed diet and exercise extensively; BP goals; benefits of medications; pt had dizziness with lisinoprol/HCTZ but denies issues with metoprolol  Plan  Continue current medications   Hyperlipidemia   LDL goal < 70 Hx CAD - mild per  Cath 2016  Lipid Panel     Component Value Date/Time   CHOL 109 07/02/2019 0742   CHOL 162 12/02/2014 0806   TRIG 102.0 07/02/2019 0742   TRIG 124 12/02/2014 0806   HDL 42.20 07/02/2019 0742   HDL 36 (L) 12/02/2014 0806   LDLCALC 46 07/02/2019 0742   LDLCALC 101 (H) 12/02/2014 0806    Hepatic Function Latest Ref Rng & Units 01/05/2020 07/02/2019 12/24/2018  Total Protein 6.0 - 8.3 g/dL 6.6 7.0 7.7  Albumin 3.5 - 5.2 g/dL 4.3 4.2 4.6  AST 0 - 37 U/L '16 16 17  ' ALT 0 - 53 U/L '19 21 21  ' Alk Phosphatase 39 - 117 U/L 69 62 61  Total Bilirubin 0.2 - 1.2 mg/dL 1.2 0.9 1.4(H)  Bilirubin, Direct 0.0 - 0.3 mg/dL - - -    The ASCVD Risk score ([place]., et al., 2013) failed to calculate for the following reasons:   The valid total cholesterol range is 130 to 320 mg/dL   Patient has failed these meds in  past: n/a Patient is currently controlled on the following medications:  . Atorvastatin 40 mg daily . Aspirin 325 mg daily  We discussed:  diet and exercise extensively; Cholesterol goals; benefits of statin for ASCVD risk reduction; pt reports he also uses aspirin for pain management benefits; he denies s/sx of bleeding other than from occasional cuts/scrapes  Plan  Continue current medications and control with diet and exercise   COPD   Last spirometry score: not on file  Gold Grade: unknown Current COPD Classification:  unknown  Lab Results  Component Value Date/Time   EOSPCT 3.9 07/02/2019 07:42 AM   EOSABS 0.3 07/02/2019 07:42 AM   Patient has failed these meds in past: Advair Patient is currently uncontrolled on the following medications:  . Anoro (umeclidinium/vilatnerol) 62.5-25 mcg/dose 1 puff daily  Using maintenance inhaler regularly? Yes  Frequency of rescue inhaler use:  never  We discussed:  Pt has been using Anoro once daily for ~10 days, he has not noticed significant improvement over Advair yet. Anoro and Advair are the same price, discussed he can use whichever works better for him. If neither works well enough, can consider Trelegy in the future.  Preferred inhalers per formulary: -Advair, Breo (Tier 3, $47/month) -Anoro, Bevespi (Tier 3, $47/month) -Trelegy (Tier 3, $47/month)  Plan  Continue current medications Can consider Trelegy in future if needed  Osteoarthritis   Patient has failed these meds in past: n/a Patient is currently controlled on the following medications:  . Tylenol 500 mg PRN  We discussed:  Patient is satisfied with current regimen and denies issues; pt reports he takes Tylenol at bedtime most days since that is when arthritis pain bothers him most; he reports increased pain in hip lately and again encouraged him to use Tylenol as needed.  Plan  Continue current medications  Medication Management   Pt uses [facility] for all medications Uses pill box? No - prefers bottles Pt endorses 100% compliance  We discussed: Current pharmacy is preferred with insurance plan and patient is satisfied with pharmacy services; pt is also planning to meet with health insurance agent to optimize his plan for 2022 - he would like hearing aids to be covered  Plan  Continue current medication management strategy    Follow up: 6 month phone visit  Charlene Brooke, PharmD, BCACP Clinical Pharmacist [facility] at [facility] [phone]

## 2020-05-22 NOTE — Patient Instructions (Signed)
Visit Information  Phone number for Pharmacist: [phone]  Goals Addressed            This Visit's Progress   . Pharmacy Care Plan   On track    CARE PLAN ENTRY (see longitudinal plan of care for additional care plan information)  Current Barriers:  . Chronic Disease Management support, education, and care coordination needs related to Hypertension, Hyperlipidemia, Coronary Artery Disease, and COPD   Hypertension BP Readings from Last 3 Encounters:  01/05/20 122/70  01/05/20 122/70  07/01/19 120/78 .  Pharmacist Clinical Goal(s): o Over the next 180 days, patient will work with PharmD and providers to maintain BP goal <130/80 . Current regimen:  o Metoprolol tartrate 25 mg - 1/2 tablet twice daily . Interventions: o Discussed BP goals and benefits of medications for prevention of heart attack / stroke . Patient self care activities - Over the next 180 days, patient will: o Check BP daily, document, and provide at future appointments o Ensure daily salt intake < 2300 mg/day  Hyperlipidemia / CAD Lab Results  Component Value Date/Time   LDLCALC 46 07/02/2019 07:42 AM   LDLCALC 101 (H) 12/02/2014 08:06 AM .  Pharmacist Clinical Goal(s): o Over the next 180 days, patient will work with PharmD and providers to maintain LDL goal < 70 . Current regimen:  o Atorvastatin 40 mg daily o Aspirin 325 mg daily . Interventions: o Discussed cholesterol goals and benefits of medications for prevention of heart attack / stroke . Patient self care activities - Over the next 180 days, patient will: o Continue current medications and lifestyle  COPD . Pharmacist Clinical Goal(s) o Over the next 30 days, patient will work with PharmD and providers to optimize therapy . Current regimen:  . Anoro Ellipta 1 puff daily . Interventions: o Discussed benefits of Anoro over Advair including once daily dosing  o Contacted patient's pharmacy to ensure medication is covered - Tier 3  $47/month . Patient self care activities - Over the next 30 days, patient will: o Continue Anoro for at least a month, continue if symptoms improve over Advair  Medication management . Pharmacist Clinical Goal(s): o Over the next 180 days, patient will work with PharmD and providers to maintain optimal medication adherence . [facility] . Interventions o Comprehensive medication review performed. o Continue current medication management strategy . Patient self care activities - Over the next 180 days, patient will: o Focus on medication adherence by fill date o Take medications as prescribed o Report any questions or concerns to PharmD and/or provider(s)  Please see past updates related to this goal by clicking on the "Past Updates" button in the selected goal       Patient verbalizes understanding of instructions provided today.  Telephone follow up appointment with pharmacy team member scheduled for: 6 months  Al Corpus, PharmD, [facility] Clinical Pharmacist [facility] at [facility] [phone]

## 2020-05-28 NOTE — Progress Notes (Signed)
Subjective:    Patient ID: Preston Boyd, male    DOB: 1941-03-30, 79 y.o.   MRN: [id number]  HPI The patient is here for an acute visit.  CKD dates back to 2010.  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    Last week two days he had decreased urination.  He has had lower back pain and was concerned he had an infection.  He has been drinking a lot of water.  His urine amount is improving.  He has difficulting urinating esp first thing in the morning. He knows his prostate is enlarged - he takes flaxseed for that and it has helped.  He wanted to come today because he was nervous about his kidneys.      Medications and allergies reviewed with patient and updated if appropriate.  Patient Active Problem List   Diagnosis Date Noted  . Snores 12/24/2018  . Chronic right flank pain 12/18/2017  . Hearing loss 06/19/2017  . Ventral hernia 06/19/2017  . Prediabetes 06/18/2017  . Aortic atherosclerosis (HCC) 04/03/2017  . Left inguinal hernia 04/03/2017  . CAD (coronary artery disease) 01/20/2015  . Hyperlipidemia 12/05/2014  . CKD (chronic kidney disease) stage 3, GFR 30-59 ml/min (HCC)   . COPD (chronic obstructive pulmonary disease) (HCC)   . MIGRAINE HEADACHE 07/07/2009  . Hypertension 07/07/2009  . Nodular hyperplasia of prostate gland 07/07/2009  . DEGENERATIVE JOINT DISEASE, RIGHT HIP 07/07/2009    Current Outpatient Medications on File Prior to Visit  Medication Sig Dispense Refill  . acetaminophen (TYLENOL) 500 MG tablet Take 500 mg by mouth every 6 (six) hours as needed for moderate pain.     Marland Kitchen aspirin EC 325 MG tablet Take 1 tablet (325 mg total) by mouth daily. 100 tablet 3  . atorvastatin (LIPITOR) 40 MG tablet TAKE ONE TABLET BY MOUTH DAILY 90 tablet 1  . Flaxseed, Linseed, (FLAX SEED OIL PO) Take 15 mLs by mouth daily.    . GuaiFENesin (MUCINEX PO) Take 1 tablet by mouth as needed (CONGESTION).     Marland Kitchen  metoprolol tartrate (LOPRESSOR) 25 MG tablet Take 0.5 tablets (12.5 mg total) by mouth 2 (two) times daily. Annual appt due in Dec must see provider for future refills 90 tablet 0  . Olopatadine HCl (PAZEO) 0.7 % SOLN Place 1 drop into both eyes daily as needed (for dry eyes).    . Propylene Glycol (SYSTANE BALANCE OP) Place 1 drop into both eyes as needed (for dry eyes).    Marland Kitchen umeclidinium-vilanterol (ANORO ELLIPTA) 62.5-25 MCG/INH AEPB Inhale 1 puff into the lungs daily. 1 each 5   No current facility-administered medications on file prior to visit.    Past Medical History:  Diagnosis Date  . Acute cholecystitis s/p lap cholecystectomy 06/14/2015 06/13/2015  . BENIGN PROSTATIC HYPERTROPHY   . CAD (coronary artery disease) 01/2015   mild on cath 01/2015, med mgmt rec  . CKD (chronic kidney disease) stage 3, GFR 30-59 ml/min (HCC)   . COPD (chronic obstructive pulmonary disease) (HCC)   . DEGENERATIVE JOINT DISEASE, RIGHT HIP    s/p THR 11/2008  . GERD   . History of cardiovascular stress test 01/2015   Lexiscan Myoview 7/16:  Inferoseptal, inferior and apical ischemia, EF 67%, TID 1.09; intermediate risk  . Hypertension   . MIGRAINE HEADACHE   . Osteoarthritis of knee    s/p B TKR 07/2009  . RENAL  INSUFFICIENCY     Past Surgical History:  Procedure Laterality Date  . CARDIAC CATHETERIZATION N/A 02/15/2015   Procedure: Left Heart Cath and Coronary Angiography;  Surgeon: Lennette Bihari, MD;  Location: [facility] INVASIVE CV LAB;  Service: Cardiovascular;  Laterality: N/A;  . CATARACT EXTRACTION, BILATERAL  10/2012  . CHOLECYSTECTOMY N/A 06/14/2015   Procedure: LAPAROSCOPIC CHOLECYSTECTOMY WITH INTRAOPERATIVE CHOLANGIOGRAM;  Surgeon: Karie Soda, MD;  Location: [facility] ORS;  Service: General;  Laterality: N/A;  . INGUINAL HERNIA REPAIR Bilateral 1960's  . REPLACEMENT TOTAL KNEE BILATERAL  07/2009   graves  . TONSILLECTOMY    . TOTAL HIP ARTHROPLASTY  11/2008   Dr. Luiz Blare    Social History    Socioeconomic History  . Marital status: Single    Spouse name: Not on file  . Number of children: 0  . Years of education: 53  . Highest education level: Not on file  Occupational History  . Occupation: Retired  Tobacco Use  . Smoking status: Former Smoker    Quit date: 07/22/1962    Years since quitting: 57.8  . Smokeless tobacco: Never Used  Vaping Use  . Vaping Use: Never used  Substance and Sexual Activity  . Alcohol use: Yes    Alcohol/week: 0.0 standard drinks    Comment: ocassionally  . Drug use: No  . Sexual activity: Never  Other Topics Concern  . Not on file  Social History Narrative   Single, lives alone. 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 Difficulty of Paying Living Expenses: Not very hard  Food Insecurity:   . Worried About Programme researcher, broadcasting/film/video in the Last Year: Not on file  . Ran Out of Food in the Last Year: Not on file  Transportation Needs:   . Lack of Transportation (Medical): Not on file  . Lack of Transportation (Non-Medical): Not on file  Physical Activity:   . Days of Exercise per Week: Not on file  . Minutes of Exercise per Session: Not on file  Stress:   . Feeling of Stress : Not on file  Social Connections:   . Frequency of Communication with Friends and Family: Not on file  . Frequency of Social Gatherings with Friends and Family: Not on file  . Attends Religious Services: Not on file  . Active Member of Clubs or Organizations: Not on file  . Attends Banker Meetings: Not on file  . Marital Status: Not on file    Family History  Problem Relation Age of Onset  . Colon cancer Mother   . Lung cancer Father   . Stroke Paternal Grandfather   . Alcohol abuse Other   . Arthritis Other   . Diabetes Other   . Hypertension Other   . Heart disease Other   . Heart attack Maternal Uncle     Review of  Systems  Constitutional: Negative for chills and fever.  Respiratory: Positive for cough, shortness of breath and wheezing.   Cardiovascular: Negative for chest pain, palpitations and leg swelling.  Gastrointestinal: Negative for abdominal pain and nausea.  Genitourinary: Positive for difficulty urinating and frequency (nocturia 4/night). Negative for dysuria and hematuria.  Musculoskeletal: Positive for back pain (lower back).       Objective:   Vitals:   05/29/20 0826  BP: 140/82  Pulse: (!) 56  Temp: 98 F (36.7 C)  SpO2: 98%   BP Readings from Last 3 Encounters:  05/29/20 140/82  01/05/20 122/70  01/05/20 122/70   Wt Readings from Last 3 Encounters:  05/29/20 246 lb (111.6 kg)  01/05/20 249 lb (112.9 kg)  01/05/20 249 lb (112.9 kg)   Body mass index is 32.46 kg/m.   Physical Exam    Constitutional: Appears well-developed and well-nourished. No distress.  Head: Normocephalic and atraumatic.  Neck: Neck supple. No tracheal deviation present. No thyromegaly present.  No cervical lymphadenopathy Cardiovascular: Normal rate, regular rhythm and normal heart sounds.  No murmur heard. No carotid bruit .  No edema Pulmonary/Chest: Effort normal and breath sounds normal. No respiratory distress. No has no wheezes. No rales.  Skin: Skin is warm and dry. Not diaphoretic.  Psychiatric: Normal mood and affect. Behavior is normal.    US Renal CLINICAL DATA:  Chronic RIGHT flank pain.  EXAM: RENAL / URINARY TRACT ULTRASOUND COMPLETE  COMPARISON:  CT 04/01/2017.  FINDINGS: Right Kidney:  Length: 9.5 cm. Echogenicity within normal limits. No mass or hydronephrosis visualized.  Left Kidney:  Length: 10.8 cm. Echogenicity within normal limits. No mass or hydronephrosis visualized.  Bladder:  Appears normal for degree of bladder distention. Enlarged prostate similar to priors.  IMPRESSION: No mass or hydronephrosis.  Prostatomegaly.  Electronically Signed   By:  Elsie Stain M.D.   On: 12/31/2017 16:34     Assessment & Plan:    See Problem List for Assessment and Plan of chronic medical problems.    This visit occurred during the SARS-CoV-2 public health emergency.  Safety protocols were in place, including screening questions prior to the visit, additional usage of staff PPE, and extensive cleaning of exam room while observing appropriate contact time as indicated for disinfecting solutions.

## 2020-05-28 NOTE — Patient Instructions (Addendum)
  Blood work was ordered.     Medications reviewed and updated.  Changes include :  Start flomax for your prostate.     Your prescription(s) have been submitted to your pharmacy. Please take as directed and contact our office if you believe you are having problem(s) with the medication(s).    Please followup in 6 months

## 2020-05-29 ENCOUNTER — Encounter: Payer: Self-pay | Admitting: Internal Medicine

## 2020-05-29 ENCOUNTER — Ambulatory Visit (INDEPENDENT_AMBULATORY_CARE_PROVIDER_SITE_OTHER): Payer: Medicare HMO | Admitting: Internal Medicine

## 2020-05-29 ENCOUNTER — Other Ambulatory Visit: Payer: Self-pay

## 2020-05-29 VITALS — BP 140/82 | HR 56 | Temp 98.0°F | Ht 73.0 in | Wt 246.0 lb

## 2020-05-29 DIAGNOSIS — N400 Enlarged prostate without lower urinary tract symptoms: Secondary | ICD-10-CM | POA: Diagnosis not present

## 2020-05-29 DIAGNOSIS — R7303 Prediabetes: Secondary | ICD-10-CM

## 2020-05-29 DIAGNOSIS — I10 Essential (primary) hypertension: Secondary | ICD-10-CM

## 2020-05-29 DIAGNOSIS — E7849 Other hyperlipidemia: Secondary | ICD-10-CM | POA: Diagnosis not present

## 2020-05-29 DIAGNOSIS — N1832 Chronic kidney disease, stage 3b: Secondary | ICD-10-CM

## 2020-05-29 DIAGNOSIS — R350 Frequency of micturition: Secondary | ICD-10-CM

## 2020-05-29 LAB — URINALYSIS, ROUTINE W REFLEX MICROSCOPIC
Bilirubin Urine: NEGATIVE
Hgb urine dipstick: NEGATIVE
Ketones, ur: NEGATIVE
Leukocytes,Ua: NEGATIVE
Nitrite: NEGATIVE
RBC / HPF: NONE SEEN (ref 0–?)
Specific Gravity, Urine: 1.025 (ref 1.000–1.030)
Total Protein, Urine: NEGATIVE
Urine Glucose: NEGATIVE
Urobilinogen, UA: 0.2 (ref 0.0–1.0)
pH: 6 (ref 5.0–8.0)

## 2020-05-29 LAB — COMPREHENSIVE METABOLIC PANEL
ALT: 17 U/L (ref 0–53)
AST: 18 U/L (ref 0–37)
Albumin: 4.5 g/dL (ref 3.5–5.2)
Alkaline Phosphatase: 65 U/L (ref 39–117)
BUN: 18 mg/dL (ref 6–23)
CO2: 28 mEq/L (ref 19–32)
Calcium: 10 mg/dL (ref 8.4–10.5)
Chloride: 102 mEq/L (ref 96–112)
Creatinine, Ser: 1.73 mg/dL — ABNORMAL HIGH (ref 0.40–1.50)
GFR: 37.39 mL/min — ABNORMAL LOW (ref >60.00)
Glucose, Bld: 110 mg/dL — ABNORMAL HIGH (ref 70–99)
Potassium: 4.8 mEq/L (ref 3.5–5.1)
Sodium: 138 mEq/L (ref 135–145)
Total Bilirubin: 1.4 mg/dL — ABNORMAL HIGH (ref 0.2–1.2)
Total Protein: 7.2 g/dL (ref 6.0–8.3)

## 2020-05-29 LAB — CBC WITH DIFFERENTIAL/PLATELET
Basophils Absolute: 0.1 10*3/uL (ref 0.0–0.1)
Basophils Relative: 1.2 % (ref 0.0–3.0)
Eosinophils Absolute: 0.3 10*3/uL (ref 0.0–0.7)
Eosinophils Relative: 3.4 % (ref 0.0–5.0)
HCT: 52.9 % — ABNORMAL HIGH (ref 39.0–52.0)
Hemoglobin: 17.7 g/dL — ABNORMAL HIGH (ref 13.0–17.0)
Lymphocytes Relative: 33 % (ref 12.0–46.0)
Lymphs Abs: 2.6 10*3/uL (ref 0.7–4.0)
MCHC: 33.5 g/dL (ref 30.0–36.0)
MCV: 91.2 fl (ref 78.0–100.0)
Monocytes Absolute: 0.5 10*3/uL (ref 0.1–1.0)
Monocytes Relative: 6.4 % (ref 3.0–12.0)
Neutro Abs: 4.4 10*3/uL (ref 1.4–7.7)
Neutrophils Relative %: 56 % (ref 43.0–77.0)
Platelets: 195 10*3/uL (ref 150.0–400.0)
RBC: 5.8 Mil/uL (ref 4.22–5.81)
RDW: 14.3 % (ref 11.5–15.5)
WBC: 7.9 10*3/uL (ref 4.0–10.5)

## 2020-05-29 LAB — LIPID PANEL
Cholesterol: 107 mg/dL (ref 0–200)
HDL: 43.2 mg/dL (ref >39.00)
LDL Cholesterol: 44 mg/dL (ref 0–99)
NonHDL: 64.16
Total CHOL/HDL Ratio: 2
Triglycerides: 102 mg/dL (ref 0.0–149.0)
VLDL: 20.4 mg/dL (ref 0.0–40.0)

## 2020-05-29 LAB — HEMOGLOBIN A1C: Hgb A1c MFr Bld: 6.2 % (ref 4.6–6.5)

## 2020-05-29 LAB — VITAMIN D 25 HYDROXY (VIT D DEFICIENCY, FRACTURES): VITD: 41.31 ng/mL (ref 30.00–100.00)

## 2020-05-29 MED ORDER — ASPIRIN EC 81 MG PO TBEC: 81.0000 mg | DELAYED_RELEASE_TABLET | Freq: Every day | ORAL | 11 refills | Status: AC

## 2020-05-29 MED ORDER — TAMSULOSIN HCL 0.4 MG PO CAPS: 0.4000 mg | ORAL_CAPSULE | Freq: Every day | ORAL | 3 refills | Status: DC

## 2020-05-29 NOTE — Assessment & Plan Note (Addendum)
Chronic Having some difficulty urinating, especially in the morning Start flomax 0.4 mg daily - was on this in the past and he was not sure if worked all that well -will retry - if it does not work well he will let me know so we can change it

## 2020-05-29 NOTE — Assessment & Plan Note (Signed)
Chronic Check lipid panel  Continue atorvastatin 40 mg daily Regular exercise and healthy diet encouraged  

## 2020-05-29 NOTE — Assessment & Plan Note (Signed)
Chronic Check a1c Low sugar / carb diet Stressed regular exercise  

## 2020-05-29 NOTE — Assessment & Plan Note (Signed)
BP Readings from Last 3 Encounters:  05/29/20 140/82  01/05/20 122/70  01/05/20 122/70   Chronic BP well controlled Continue metoprolol 12.5 mg BID cmp

## 2020-05-29 NOTE — Assessment & Plan Note (Signed)
Chronic Recently had decreased urination and inc in frequency - ? Infection vs BPH Start flomax 0.4 mg daily check UA, Ucx, CMP Continue increased fluids

## 2020-05-30 LAB — URINE CULTURE: Result:: NO GROWTH

## 2020-06-28 ENCOUNTER — Other Ambulatory Visit: Payer: Self-pay | Admitting: Internal Medicine

## 2020-07-03 ENCOUNTER — Encounter: Payer: Medicare HMO | Admitting: Internal Medicine

## 2020-07-06 ENCOUNTER — Other Ambulatory Visit: Payer: Self-pay

## 2020-07-06 ENCOUNTER — Telehealth: Payer: Self-pay | Admitting: Internal Medicine

## 2020-07-06 MED ORDER — FLUTICASONE-SALMETEROL 250-50 MCG/DOSE IN AEPB
INHALATION_SPRAY | RESPIRATORY_TRACT | 5 refills | Status: DC
Start: 2020-07-06 — End: 2021-03-07

## 2020-07-06 NOTE — Telephone Encounter (Signed)
    Patient states the umeclidinium-vilanterol (ANORO ELLIPTA) 62.5-25 MCG/INH AEPB does not seem to be helping him, would like order for Advair

## 2020-07-06 NOTE — Telephone Encounter (Signed)
Sent in today for patient. 

## 2020-07-19 ENCOUNTER — Telehealth: Payer: Self-pay | Admitting: Pharmacist

## 2020-07-19 NOTE — Progress Notes (Addendum)
Chronic Care Management Pharmacy Assistant   Name: [name]  MRN: [id number] DOB: [birth year]  Reason for Encounter: General Adherence Call   PCP : Preston Sanes, MD  Allergies:   Allergies  Allergen Reactions   Amoxicillin Other (See Comments)    Has patient had a PCN reaction causing immediate rash, facial/tongue/throat swelling, SOB or lightheadedness with hypotension:No Has patient had a PCN reaction causing severe rash involving mucus membranes or skin necrosis:No Has patient had a PCN reaction that required hospitalization:No Has patient had a PCN reaction occurring within the last 10 years:Yes If all of the above answers are "NO", then may proceed with Cephalosporin use. BODY ACHES ALL OVER       Other Other (See Comments)    Amoxicillin > body aches all over per patient    Medications: Outpatient Encounter Medications as of 07/19/2020  Medication Sig   acetaminophen (TYLENOL) 500 MG tablet Take 500 mg by mouth every 6 (six) hours as needed for moderate pain.    aspirin EC 81 MG tablet Take 1 tablet (81 mg total) by mouth daily. Swallow whole.   atorvastatin (LIPITOR) 40 MG tablet TAKE ONE TABLET BY MOUTH DAILY   Flaxseed, Linseed, (FLAX SEED OIL PO) Take 15 mLs by mouth daily.   Fluticasone-Salmeterol (ADVAIR DISKUS) 250-50 MCG/DOSE AEPB INHALE 1 PUFF BY MOUTH INTO THE LUNGS TWICE DAILY AS NEEDED FOR SHORTNESS OF BREATH   GuaiFENesin (MUCINEX PO) Take 1 tablet by mouth as needed (CONGESTION).    metoprolol tartrate (LOPRESSOR) 25 MG tablet TAKE 1/2 TABLET BY MOUTH TWO TIMES A DAY. ANNUAL APPOINTMENT DUE IN [place]. MUST SEE PROVIDER FOR FUTURE REFILLS   Olopatadine HCl (PAZEO) 0.7 % SOLN Place 1 drop into both eyes daily as needed (for dry eyes).   Propylene Glycol (SYSTANE BALANCE OP) Place 1 drop into both eyes as needed (for dry eyes).   tamsulosin (FLOMAX) 0.4 MG CAPS capsule Take 1 capsule (0.4 mg total) by mouth daily.   umeclidinium-vilanterol  (ANORO ELLIPTA) 62.5-25 MCG/INH AEPB Inhale 1 puff into the lungs daily.   No facility-administered encounter medications on file as of 07/19/2020.    Current Diagnosis: Patient Active Problem List   Diagnosis Date Noted   Snores 12/24/2018   Chronic right flank pain 12/18/2017   Hearing loss 06/19/2017   Ventral hernia 06/19/2017   Prediabetes 06/18/2017   Aortic atherosclerosis (HCC) 04/03/2017   Left inguinal hernia 04/03/2017   CAD (coronary artery disease) 01/20/2015   Hyperlipidemia 12/05/2014   CKD (chronic kidney disease) stage 3, GFR 30-59 ml/min (HCC)    COPD (chronic obstructive pulmonary disease) (HCC)    MIGRAINE HEADACHE 07/07/2009   Hypertension 07/07/2009   Nodular hyperplasia of prostate gland 07/07/2009   DEGENERATIVE JOINT DISEASE, RIGHT HIP 07/07/2009    Goals Addressed   None     Follow-Up:  Pharmacist Review   A general adherence wellness call was made to Preston Boyd to check and see how he has been doing since his last encounter with the clinical pharmacist Preston Boyd. The patient stated that he has been doing good so far. The patient stated that he is back taking the Advair inhaler because he felt that the Anoro inhaler just did not work as well. He states that it make his chest burn and it was hard for him to breath. I told the patient that I will pass along the information to [place].  Preston Boyd, Clinical Pharmacist Assistant [facility]  Addendum 07/20/20:  Medication list updated. Preston Boyd informed of DTP and need for patient follow-up to discuss adverse reaction. 6 minutes spent in review, coordination, and documentation. Preston Boyd Clinical Pharmacist [facility]  [phone]

## 2020-09-08 ENCOUNTER — Other Ambulatory Visit: Payer: Self-pay | Admitting: Internal Medicine

## 2020-10-02 ENCOUNTER — Telehealth: Payer: Self-pay | Admitting: Pharmacist

## 2020-10-03 NOTE — Progress Notes (Addendum)
° ° °  Chronic Care Management Pharmacy Assistant   Name: Preston Boyd  MRN: [id number] DOB: 26-Mar-1941   Reason for Encounter: COPD Disease State Call   Conditions to be addressed/monitored: COPD   Recent office visits:  NA  Recent consult visits:  NA  Hospital visits:  None in previous 6 months  Medications: Outpatient Encounter Medications as of 10/02/2020  Medication Sig Note   acetaminophen (TYLENOL) 500 MG tablet Take 500 mg by mouth every 6 (six) hours as needed for moderate pain.     aspirin EC 81 MG tablet Take 1 tablet (81 mg total) by mouth daily. Swallow whole.    atorvastatin (LIPITOR) 40 MG tablet TAKE ONE TABLET BY MOUTH DAILY    Flaxseed, Linseed, (FLAX SEED OIL PO) Take 15 mLs by mouth daily.    Fluticasone-Salmeterol (ADVAIR DISKUS) 250-50 MCG/DOSE AEPB INHALE 1 PUFF BY MOUTH INTO THE LUNGS TWICE DAILY AS NEEDED FOR SHORTNESS OF BREATH    GuaiFENesin (MUCINEX PO) Take 1 tablet by mouth as needed (CONGESTION).     metoprolol tartrate (LOPRESSOR) 25 MG tablet TAKE 1/2 TABLET BY MOUTH TWO TIMES A DAY. ANNUAL APPOINTMENT DUE IN [place]. MUST SEE PROVIDER FOR FUTURE REFILLS    Olopatadine HCl (PAZEO) 0.7 % SOLN Place 1 drop into both eyes daily as needed (for dry eyes).    Propylene Glycol (SYSTANE BALANCE OP) Place 1 drop into both eyes as needed (for dry eyes).    tamsulosin (FLOMAX) 0.4 MG CAPS capsule Take 1 capsule (0.4 mg total) by mouth daily.    umeclidinium-vilanterol (ANORO ELLIPTA) 62.5-25 MCG/INH AEPB Inhale 1 puff into the lungs daily. (Patient not taking: Reported on 07/20/2020) 07/20/2020: Patient reports burning sensation when he uses.   No facility-administered encounter medications on file as of 10/02/2020.   Current COPD regimen: The patient states that he uses and Advair 250-50 mcg inhale 2 puffs twice daily as needed for SOB  No flowsheet data found.  Any recent hospitalizations or ED visits since last visit with CPP? The patient  has not has any hospital or ED visits  Reports denies COPD symptoms  What recent interventions/DTPs have been made by any provider to improve breathing since last visit: None ID  Have you had exacerbation/flare-up since last visit? The patient states that he has not had any flare up since last visit  What do you do when you are short of breath? The patient states when he gets sob he will sit down until he catches his breath  Respiratory Devices/Equipment  Do you have a nebulizer? No  Do you use a Peak Flow Meter? No  Do you use a maintenance inhaler? Advair  How often do you forget to use your daily inhaler? The patient states that he does not forget to use inhaler  Do you use a rescue inhaler? No  How often do you use your rescue inhaler?  The patient states that he does not use a rescue inhaler  Do you use a spacer with your inhaler? No  Adherence Review:  Does the patient have >5 day gap between last estimated fill date for maintenance inhaler medications? No    Horald Pollen, CPA [facility] [phone]   Time spent:40 minutes-telephone call, reviewing chart notes, and documentation

## 2020-10-09 NOTE — Telephone Encounter (Signed)
Patient calling back, requesting a call back at [phone]

## 2020-11-20 ENCOUNTER — Telehealth: Payer: Medicare HMO

## 2020-11-20 NOTE — Progress Notes (Deleted)
Chronic Care Management Pharmacy Note  11/20/2020 Name:  Preston Boyd MRN:  [id number] DOB:  29-Aug-1940  Subjective: Preston Boyd is an 80 y.o. year old male who is a primary patient of Burns, Claudina Lick, MD.  The CCM team was consulted for assistance with disease management and care coordination needs.    Engaged with patient by telephone for follow up visit in response to provider referral for pharmacy case management and/or care coordination services.   Consent to Services:  The patient was given information about Chronic Care Management services, agreed to services, and gave verbal consent prior to initiation of services.  Please see initial visit note for detailed documentation.   Patient Care Team: Binnie Rail, MD as PCP - General (Internal Medicine) Dorna Leitz, MD as Consulting Physician (Orthopedic Surgery) Sharol Roussel, [place] as Physician Assistant (Optometry) [place], [facility] Eduardo Osier., MD as Consulting Physician (Urology) Troy Sine, MD (Cardiology) Wilford Corner, MD as Consulting Physician (Gastroenterology) Charlton Haws, [facility] as Pharmacist (Pharmacist)  Recent office visits: 05/29/20 Dr Quay Burow OV: acute visit for decreased urination, back pain. Rx'd tamsulosin 0.4 mg. UCx negative. Kidney function declined, referred to nephrologist.  Recent consult visits: [facility] visits: None in previous 6 months  Objective:  Lab Results  Component Value Date   CREATININE 1.73 (H) 05/29/2020   BUN 18 05/29/2020   GFR 37.39 (L) 05/29/2020   GFRNONAA 44 (L) 06/21/2015   GFRAA 52 (L) 06/21/2015   NA 138 05/29/2020   K 4.8 05/29/2020   CALCIUM 10.0 05/29/2020   CO2 28 05/29/2020   GLUCOSE 110 (H) 05/29/2020    Lab Results  Component Value Date/Time   HGBA1C 6.2 05/29/2020 09:27 AM   HGBA1C 6.2 01/05/2020 08:58 AM   GFR 37.39 (L) 05/29/2020 09:27 AM   GFR 38.92 (L) 01/05/2020 08:58 AM    Last diabetic Eye exam: No results found for: HMDIABEYEEXA   Last diabetic Foot exam: No results found for: HMDIABFOOTEX   Lab Results  Component Value Date   CHOL 107 05/29/2020   HDL 43.20 05/29/2020   LDLCALC 44 05/29/2020   TRIG 102.0 05/29/2020   CHOLHDL 2 05/29/2020    Hepatic Function Latest Ref Rng & Units 05/29/2020 01/05/2020 07/02/2019  Total Protein 6.0 - 8.3 g/dL 7.2 6.6 7.0  Albumin 3.5 - 5.2 g/dL 4.5 4.3 4.2  AST 0 - 37 U/L '18 16 16  ' ALT 0 - 53 U/L '17 19 21  ' Alk Phosphatase 39 - 117 U/L 65 69 62  Total Bilirubin 0.2 - 1.2 mg/dL 1.4(H) 1.2 0.9  Bilirubin, Direct 0.0 - 0.3 mg/dL - - -    Lab Results  Component Value Date/Time   TSH 1.77 07/02/2019 07:42 AM   TSH 1.73 06/23/2018 10:28 AM    CBC Latest Ref Rng & Units 05/29/2020 07/02/2019 12/24/2018  WBC 4.0 - 10.5 K/uL 7.9 6.9 7.3  Hemoglobin 13.0 - 17.0 g/dL 17.7(H) 16.9 17.7(H)  Hematocrit 39.0 - 52.0 % 52.9(H) 51.7 52.1(H)  Platelets 150.0 - 400.0 K/uL 195.0 166.0 181.0    Lab Results  Component Value Date/Time   VD25OH 41.31 05/29/2020 09:27 AM    Clinical ASCVD: Yes  The ASCVD Risk score Mikey Bussing DC Jr., et al., 2013) failed to calculate for the following reasons:   The valid total cholesterol range is 130 to 320 mg/dL    Depression screen [facility] 2/9 01/05/2020 06/23/2018 06/23/2018  Decreased Interest 0 0 0  Down, Depressed, Hopeless 0 0  0  PHQ - 2 Score 0 0 0  Altered sleeping - 0 -  Tired, decreased energy - 0 -  Change in appetite - 0 -  Feeling bad or failure about yourself  - 0 -  Trouble concentrating - 0 -  Moving slowly or fidgety/restless - 0 -  Suicidal thoughts - 0 -  PHQ-9 Score - 0 -  Difficult doing work/chores - Not difficult at all -     Social History   Tobacco Use  Smoking Status Former Smoker  . Quit date: 07/22/1962  . Years since quitting: 58.3  Smokeless Tobacco Never Used   BP Readings from Last 3 Encounters:  05/29/20 140/82  01/05/20 122/70  01/05/20 122/70   Pulse Readings from Last 3 Encounters:  05/29/20 (!) 56  01/05/20  61  01/05/20 61   Wt Readings from Last 3 Encounters:  05/29/20 246 lb (111.6 kg)  01/05/20 249 lb (112.9 kg)  01/05/20 249 lb (112.9 kg)   BMI Readings from Last 3 Encounters:  05/29/20 32.46 kg/m  01/05/20 32.85 kg/m  01/05/20 32.85 kg/m    Assessment/Interventions: Review of patient past medical history, allergies, medications, health status, including review of consultants reports, laboratory and other test data, was performed as part of comprehensive evaluation and provision of chronic care management services.   SDOH:  (Social Determinants of Health) assessments and interventions performed: Yes  SDOH Screenings   Alcohol Screen: Not on file  Depression (PHQ2-9): Low Risk   . PHQ-2 Score: 0  Financial Resource Strain: Low Risk   . Difficulty of Paying Living Expenses: Not very hard  Food Insecurity: Not on file  Housing: Not on file  Physical Activity: Not on file  Social Connections: Not on file  Stress: Not on file  Tobacco Use: Medium Risk  . Smoking Tobacco Use: Former Smoker  . Smokeless Tobacco Use: Never Used  Transportation Needs: Not on file    CCM Care Plan  Allergies  Allergen Reactions  . Amoxicillin Other (See Comments)    Has patient had a PCN reaction causing immediate rash, facial/tongue/throat swelling, SOB or lightheadedness with hypotension:No Has patient had a PCN reaction causing severe rash involving mucus membranes or skin necrosis:No Has patient had a PCN reaction that required hospitalization:No Has patient had a PCN reaction occurring within the last 10 years:Yes If all of the above answers are "NO", then may proceed with Cephalosporin use. BODY ACHES ALL OVER      . Other Other (See Comments)    Amoxicillin > body aches all over per patient    Medications Reviewed Today    Reviewed by Germaine Pomfret, [facility] (Pharmacist) on 07/20/20 at 1313  Med List Status: <None>  Medication Order Taking? Sig Documenting Provider Last Dose  Status Informant  acetaminophen (TYLENOL) 500 MG tablet [id number]  Take 500 mg by mouth every 6 (six) hours as needed for moderate pain.  [provider]  Active Self  aspirin EC 81 MG tablet [id number]  Take 1 tablet (81 mg total) by mouth daily. Swallow whole. Binnie Rail, MD  Active   atorvastatin (LIPITOR) 40 MG tablet [id number]  TAKE ONE TABLET BY MOUTH DAILY Burns, Claudina Lick, MD  Active   Flaxseed, Linseed, (FLAX SEED OIL PO) [id number]  Take 15 mLs by mouth daily. [provider]  Active Self  Fluticasone-Salmeterol (ADVAIR DISKUS) 250-50 MCG/DOSE AEPB [id number] Yes INHALE 1 PUFF BY MOUTH INTO THE LUNGS TWICE DAILY AS NEEDED FOR SHORTNESS OF  Garnet Koyanagi, MD Taking Active   GuaiFENesin (MUCINEX PO) [id number]  Take 1 tablet by mouth as needed (CONGESTION).  [provider]  Active Self  metoprolol tartrate (LOPRESSOR) 25 MG tablet [id number]  TAKE 1/2 TABLET BY MOUTH TWO TIMES A DAY. ANNUAL APPOINTMENT DUE IN [place]. MUST SEE PROVIDER FOR FUTURE REFILLS Burns, Claudina Lick, MD  Active   Olopatadine HCl (PAZEO) 0.7 % SOLN [id number]  Place 1 drop into both eyes daily as needed (for dry eyes). [provider]  Active Self  Propylene Glycol (SYSTANE BALANCE OP) [id number]  Place 1 drop into both eyes as needed (for dry eyes). [provider]  Active Self  tamsulosin (FLOMAX) 0.4 MG CAPS capsule [id number]  Take 1 capsule (0.4 mg total) by mouth daily. Binnie Rail, MD  Active   umeclidinium-vilanterol [facility] ELLIPTA) 62.5-25 MCG/INH AEPB [id number] No Inhale 1 puff into the lungs daily.  Patient not taking: Reported on 07/20/2020   Binnie Rail, MD Not Taking Active            Med Note Daron Offer A   Thu Jul 20, 2020  1:13 PM) Patient reports burning sensation when he uses.          Patient Active Problem List   Diagnosis Date Noted  . Snores 12/24/2018  . Chronic right flank pain 12/18/2017  . Hearing loss 06/19/2017  . Ventral  hernia 06/19/2017  . Prediabetes 06/18/2017  . Aortic atherosclerosis ([place]) 04/03/2017  . Left inguinal hernia 04/03/2017  . CAD (coronary artery disease) 01/20/2015  . Hyperlipidemia 12/05/2014  . CKD (chronic kidney disease) stage 3, GFR 30-59 ml/min (HCC)   . COPD (chronic obstructive pulmonary disease) ([place])   . MIGRAINE HEADACHE 07/07/2009  . Hypertension 07/07/2009  . Nodular hyperplasia of prostate gland 07/07/2009  . DEGENERATIVE JOINT DISEASE, RIGHT HIP 07/07/2009    Immunization History  Administered Date(s) Administered  . Influenza Split 05/21/2012  . Influenza Whole 07/07/2009  . Influenza, High Dose Seasonal PF 05/18/2013, 06/12/2015, 06/17/2016, 06/19/2017, 06/23/2018, 05/10/2019  . Influenza,inj,Quad PF,6+ Mos 05/19/2014  . Influenza-Unspecified 05/10/2020  . PFIZER(Purple Top)SARS-COV-2 Vaccination 09/14/2019, 09/25/2019  . Pneumococcal Conjugate-13 05/19/2014  . Pneumococcal Polysaccharide-23 07/18/2016  . Td 07/07/2009  . Zoster 03/07/2011    Conditions to be addressed/monitored:  Hypertension, Hyperlipidemia, Coronary Artery Disease, COPD, Osteoarthritis and BPH  There are no care plans that you recently modified to display for this patient.    Medication Assistance: {MEDASSISTANCEINFO:25044}  Patient's preferred pharmacy is:  [facility] [address], [place] [address] Phone: [phone] Fax: [phone]  Uses pill box? {Yes or If no, why not?:20788} Pt endorses ***% compliance  We discussed: {Pharmacy options:24294} Patient decided to: {US Pharmacy Plan:23885}  Care Plan and Follow Up Patient Decision:  {FOLLOWUP:24991}  Plan: {CM FOLLOW UP SAYT:01601}  ***    Current Barriers:  . {pharmacybarriers:24917}  Pharmacist Clinical Goal(s):  Marland Kitchen Patient will {PHARMACYGOALCHOICES:24921} through collaboration with PharmD and provider.   Interventions: . 1:1  collaboration with Binnie Rail, MD regarding development and update of comprehensive plan of care as evidenced by provider attestation and co-signature . Inter-disciplinary care team collaboration (see longitudinal plan of care) . Comprehensive medication review performed; medication list updated in electronic medical record    Hypertension   BP goal is:  <130/80 Patient checks BP at home daily Patient home BP readings are ranging: SBP 110-120  Patient has  failed these meds in the past: lisinopril- HCTZ, Patient is currently controlled on the following medications:   Metoprolol tartrate 25 mg - 1/2 tab BID  We discussed diet and exercise extensively; BP goals; benefits of medications; pt had dizziness with lisinoprol/HCTZ but denies issues with metoprolol  Plan: Continue current medications   Hyperlipidemia   LDL goal < 70 Hx CAD/aortic atherosclerosis - mild per Cath 2016  Patient has failed these meds in past: n/a Patient is currently controlled on the following medications:   Atorvastatin 40 mg daily  Aspirin 325 mg daily  We discussed:  diet and exercise extensively; Cholesterol goals; benefits of statin for ASCVD risk reduction; pt reports he also uses aspirin for pain management benefits; he denies s/sx of bleeding other than from occasional cuts/scrapes  Plan: Continue current medications and control with diet and exercise  COPD   Last spirometry score: not on file Gold Grade: unknown Current COPD Classification:  unknown  Patient has failed these meds in past: Advair Patient is currently uncontrolled on the following medications:   Advair Diskus 250-50 mcg/dose BID  Using maintenance inhaler regularly? Yes  Frequency of rescue inhaler use:  never  We discussed:  Pt has been using Anoro once daily for ~10 days, he has not noticed significant improvement over Advair yet. Anoro and Advair are the same price, discussed he can use whichever works  better for him. If neither works well enough, can consider Trelegy in the future.  Preferred inhalers per formulary: -Advair, Breo (Tier 3, $47/month) -Anoro, Bevespi (Tier 3, $47/month) -Trelegy (Tier 3, $47/month)  Plan Continue current medications Can consider Trelegy in future if needed  Osteoarthritis   Patient has failed these meds in past: n/a Patient is currently controlled on the following medications:   Tylenol 500 mg PRN  We discussed:  Patient is satisfied with current regimen and denies issues; pt reports he takes Tylenol at bedtime most days since that is when arthritis pain bothers him most; he reports increased pain in hip lately and again encouraged him to use Tylenol as needed.  Plan: Continue current medications  BPH   Patient is currently {CHL Controlled/Uncontrolled:2098609762} on the following medications:  . Tamsulosin 0.4 mg daily  We discussed:  ***  Plan: Continue {CHL HP [facility] BRAXE:9407680881}  Patient Goals/Self-Care Activities . Patient will:  - {pharmacypatientgoals:24919}  Follow Up Plan: {CM FOLLOW UP JSRP:59458}

## 2020-11-28 NOTE — Patient Instructions (Addendum)
Blood work was ordered.  Go to the lab downstairs.   Medications changes include :   none  Your prescription(s) have been submitted to your pharmacy. Please take as directed and contact our office if you believe you are having problem(s) with the medication(s).   Work on increasing your walking and weight loss.   Please followup in 6 months    Health Maintenance, Male Adopting a healthy lifestyle and getting preventive care are important in promoting health and wellness. Ask your health care provider about:  The right schedule for you to have regular tests and exams.  Things you can do on your own to prevent diseases and keep yourself healthy. What should I know about diet, weight, and exercise? Eat a healthy diet  Eat a diet that includes plenty of vegetables, fruits, low-fat dairy products, and lean protein.  Do not eat a lot of foods that are high in solid fats, added sugars, or sodium.   Maintain a healthy weight Body mass index (BMI) is a measurement that can be used to identify possible weight problems. It estimates body fat based on height and weight. Your health care provider can help determine your BMI and help you achieve or maintain a healthy weight. Get regular exercise Get regular exercise. This is one of the most important things you can do for your health. Most adults should:  Exercise for at least 150 minutes each week. The exercise should increase your heart rate and make you sweat (moderate-intensity exercise).  Do strengthening exercises at least twice a week. This is in addition to the moderate-intensity exercise.  Spend less time sitting. Even light physical activity can be beneficial. Watch cholesterol and blood lipids Have your blood tested for lipids and cholesterol at 79 years of age, then have this test every 5 years. You may need to have your cholesterol levels checked more often if:  Your lipid or cholesterol levels are high.  You are older  than 79 years of age.  You are at high risk for heart disease. What should I know about cancer screening? Many types of cancers can be detected early and may often be prevented. Depending on your health history and family history, you may need to have cancer screening at various ages. This may include screening for:  Colorectal cancer.  Prostate cancer.  Skin cancer.  Lung cancer. What should I know about heart disease, diabetes, and high blood pressure? Blood pressure and heart disease  High blood pressure causes heart disease and increases the risk of stroke. This is more likely to develop in people who have high blood pressure readings, are of African descent, or are overweight.  Talk with your health care provider about your target blood pressure readings.  Have your blood pressure checked: ? Every 3-5 years if you are 36-42 years of age. ? Every year if you are 54 years old or older.  If you are between the ages of 15 and 72 and are a current or former smoker, ask your health care provider if you should have a one-time screening for abdominal aortic aneurysm (AAA). Diabetes Have regular diabetes screenings. This checks your fasting blood sugar level. Have the screening done:  Once every three years after age 67 if you are at a normal weight and have a low risk for diabetes.  More often and at a younger age if you are overweight or have a high risk for diabetes. What should I know about preventing infection? Hepatitis B If  you have a higher risk for hepatitis B, you should be screened for this virus. Talk with your health care provider to find out if you are at risk for hepatitis B infection. Hepatitis C Blood testing is recommended for:  Everyone born from [birth year] through 1964.  Anyone with known risk factors for hepatitis C. Sexually transmitted infections (STIs)  You should be screened each year for STIs, including gonorrhea and chlamydia, if: ? You are sexually active  and are younger than 79 years of age. ? You are older than 79 years of age and your health care provider tells you that you are at risk for this type of infection. ? Your sexual activity has changed since you were last screened, and you are at increased risk for chlamydia or gonorrhea. Ask your health care provider if you are at risk.  Ask your health care provider about whether you are at high risk for HIV. Your health care provider may recommend a prescription medicine to help prevent HIV infection. If you choose to take medicine to prevent HIV, you should first get tested for HIV. You should then be tested every 3 months for as long as you are taking the medicine. Follow these instructions at home: Lifestyle  Do not use any products that contain nicotine or tobacco, such as cigarettes, e-cigarettes, and chewing tobacco. If you need help quitting, ask your health care provider.  Do not use street drugs.  Do not share needles.  Ask your health care provider for help if you need support or information about quitting drugs. Alcohol use  Do not drink alcohol if your health care provider tells you not to drink.  If you drink alcohol: ? Limit how much you have to 0-2 drinks a day. ? Be aware of how much alcohol is in your drink. In the [place] one drink equals one 12 oz bottle of beer (355 mL), one 5 oz glass of wine (148 mL), or one 1 oz glass of hard liquor (44 mL). General instructions  Schedule regular health, dental, and eye exams.  Stay current with your vaccines.  Tell your health care provider if: ? You often feel depressed. ? You have ever been abused or do not feel safe at home. Summary  Adopting a healthy lifestyle and getting preventive care are important in promoting health and wellness.  Follow your health care provider's instructions about healthy diet, exercising, and getting tested or screened for diseases.  Follow your health care provider's instructions on  monitoring your cholesterol and blood pressure. This information is not intended to replace advice given to you by your health care provider. Make sure you discuss any questions you have with your health care provider. Document Revised: 07/01/2018 Document Reviewed: 07/01/2018 Elsevier Patient Education  2021 [employer].

## 2020-11-28 NOTE — Progress Notes (Signed)
Subjective:    Patient ID: Preston Boyd, male    DOB: July 19, 1941, 80 y.o.   MRN: [id number]  HPI He is here for a physical exam.   He gets cramping in his leg at night and sometimes the right hand during the day.  He tries to drink a lot of water.    Medications and allergies reviewed with patient and updated if appropriate.  Patient Active Problem List   Diagnosis Date Noted  . Snores 12/24/2018  . Chronic right flank pain 12/18/2017  . Hearing loss 06/19/2017  . Ventral hernia 06/19/2017  . Prediabetes 06/18/2017  . Aortic atherosclerosis (HCC) 04/03/2017  . Left inguinal hernia 04/03/2017  . CAD (coronary artery disease) 01/20/2015  . Hyperlipidemia 12/05/2014  . CKD (chronic kidney disease) stage 3, GFR 30-59 ml/min (HCC)   . COPD (chronic obstructive pulmonary disease) (HCC)   . MIGRAINE HEADACHE 07/07/2009  . Hypertension 07/07/2009  . Nodular hyperplasia of prostate gland 07/07/2009  . DEGENERATIVE JOINT DISEASE, RIGHT HIP 07/07/2009    Current Outpatient Medications on File Prior to Visit  Medication Sig Dispense Refill  . acetaminophen (TYLENOL) 500 MG tablet Take 500 mg by mouth every 6 (six) hours as needed for moderate pain.    Marland Kitchen aspirin EC 81 MG tablet Take 1 tablet (81 mg total) by mouth daily. Swallow whole. 30 tablet 11  . atorvastatin (LIPITOR) 40 MG tablet TAKE ONE TABLET BY MOUTH DAILY 90 tablet 1  . Fluticasone-Salmeterol (ADVAIR DISKUS) 250-50 MCG/DOSE AEPB INHALE 1 PUFF BY MOUTH INTO THE LUNGS TWICE DAILY AS NEEDED FOR SHORTNESS OF BREATH 60 each 5  . metoprolol tartrate (LOPRESSOR) 25 MG tablet TAKE 1/2 TABLET BY MOUTH TWO TIMES A DAY. ANNUAL APPOINTMENT DUE IN [place]. MUST SEE PROVIDER FOR FUTURE REFILLS 90 tablet 2  . Olopatadine HCl 0.7 % SOLN Place 1 drop into both eyes daily as needed (for dry eyes).    . Propylene Glycol (SYSTANE BALANCE OP) Place 1 drop into both eyes as needed (for dry eyes).    . tamsulosin (FLOMAX) 0.4 MG CAPS capsule  Take 1 capsule (0.4 mg total) by mouth daily. 90 capsule 3   No current facility-administered medications on file prior to visit.    Past Medical History:  Diagnosis Date  . Acute cholecystitis s/p lap cholecystectomy 06/14/2015 06/13/2015  . BENIGN PROSTATIC HYPERTROPHY   . CAD (coronary artery disease) 01/2015   mild on cath 01/2015, med mgmt rec  . CKD (chronic kidney disease) stage 3, GFR 30-59 ml/min (HCC)   . COPD (chronic obstructive pulmonary disease) (HCC)   . DEGENERATIVE JOINT DISEASE, RIGHT HIP    s/p THR 11/2008  . GERD   . History of cardiovascular stress test 01/2015   Lexiscan Myoview 7/16:  Inferoseptal, inferior and apical ischemia, EF 67%, TID 1.09; intermediate risk  . Hypertension   . MIGRAINE HEADACHE   . Osteoarthritis of knee    s/p B TKR 07/2009  . RENAL INSUFFICIENCY     Past Surgical History:  Procedure Laterality Date  . CARDIAC CATHETERIZATION N/A 02/15/2015   Procedure: Left Heart Cath and Coronary Angiography;  Surgeon: Lennette Bihari, MD;  Location: [facility] INVASIVE CV LAB;  Service: Cardiovascular;  Laterality: N/A;  . CATARACT EXTRACTION, BILATERAL  10/2012  . CHOLECYSTECTOMY N/A 06/14/2015   Procedure: LAPAROSCOPIC CHOLECYSTECTOMY WITH INTRAOPERATIVE CHOLANGIOGRAM;  Surgeon: Karie Soda, MD;  Location: [facility] ORS;  Service: General;  Laterality: N/A;  . INGUINAL HERNIA REPAIR Bilateral 1960's  .  REPLACEMENT TOTAL KNEE BILATERAL  07/2009   graves  . TONSILLECTOMY    . TOTAL HIP ARTHROPLASTY  11/2008   Dr. Luiz Blare    Social History   Socioeconomic History  . Marital status: Single    Spouse name: Not on file  . Number of children: 0  . Years of education: 68  . Highest education level: Not on file  Occupational History  . Occupation: Retired  Tobacco Use  . Smoking status: Former Smoker    Quit date: 07/22/1962    Years since quitting: 58.3  . Smokeless tobacco: Never Used  Vaping Use  . Vaping Use: Never used  Substance and Sexual Activity  .  Alcohol use: Yes    Alcohol/week: 0.0 standard drinks    Comment: ocassionally  . Drug use: No  . Sexual activity: Never  Other Topics Concern  . Not on file  Social History Narrative   Single, lives alone. 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 Difficulty of Paying Living Expenses: Not very hard  Food Insecurity: Not on file  Transportation Needs: Not on file  Physical Activity: Not on file  Stress: Not on file  Social Connections: Not on file    Family History  Problem Relation Age of Onset  . Colon cancer Mother   . Lung cancer Father   . Stroke Paternal Grandfather   . Alcohol abuse Other   . Arthritis Other   . Diabetes Other   . Hypertension Other   . Heart disease Other   . Heart attack Maternal Uncle     Review of Systems  Constitutional: Negative for chills and fever.  Eyes: Negative for visual disturbance.  Respiratory: Positive for cough (occ, dry) and shortness of breath (chronic). Negative for wheezing.   Cardiovascular: Negative for chest pain, palpitations and leg swelling.  Gastrointestinal: Positive for constipation (occ) and diarrhea (occ). Negative for abdominal pain, blood in stool and nausea.       No gerd  Genitourinary: Positive for difficulty urinating (slow stream) and frequency.  Musculoskeletal: Positive for arthralgias. Negative for back pain.  Skin: Negative for rash.  Neurological: Positive for headaches (rare).  Psychiatric/Behavioral: Negative for dysphoric mood. The patient is not nervous/anxious.        Objective:   Vitals:   11/29/20 0810  BP: 128/70  Pulse: 65  Temp: 98 F (36.7 C)  SpO2: 99%   Filed Weights   11/29/20 0810  Weight: 252 lb (114.3 kg)   Body mass index is 33.25 kg/m.  BP Readings from Last 3 Encounters:  11/29/20 128/70  05/29/20 140/82  01/05/20 122/70    Wt Readings from  Last 3 Encounters:  11/29/20 252 lb (114.3 kg)  05/29/20 246 lb (111.6 kg)  01/05/20 249 lb (112.9 kg)     Physical Exam Constitutional: He appears well-developed and well-nourished. No distress.  HENT:  Head: Normocephalic and atraumatic.  Right Ear: External ear normal.  Left Ear: External ear normal.  Mouth/Throat: Oropharynx is clear and moist.  Normal ear canals and TM b/l  Eyes: Conjunctivae and EOM are normal.  Neck: Neck supple. No tracheal deviation present. No thyromegaly present.  No carotid bruit  Cardiovascular: Normal rate, regular rhythm, normal heart sounds and intact distal pulses.   2/6 systolic murmur heard. Pulmonary/Chest: Effort normal and breath sounds normal. No respiratory distress. He has  no wheezes. He has no rales.  Abdominal: Soft.  Ventral hernia.  He exhibits no distension. There is no tenderness.  Genitourinary: deferred  Musculoskeletal: He exhibits no edema.  Lymphadenopathy:   He has no cervical adenopathy.  Skin: Skin is warm and dry. He is not diaphoretic.  Psychiatric: He has a normal mood and affect. His behavior is normal.         Assessment & Plan:   Physical exam: Screening blood work  ordered Immunizations  Discussed tdap, had 1st covid booster - advised getting 4th injection Colonoscopy   N/a due to age Eye exams   Up to date Exercise   Going to the gym 3/week Weight  Encouraged weight loss Substance abuse     See Problem List for Assessment and Plan of chronic medical problems.   This visit occurred during the SARS-CoV-2 public health emergency.  Safety protocols were in place, including screening questions prior to the visit, additional usage of staff PPE, and extensive cleaning of exam room while observing appropriate contact time as indicated for disinfecting solutions.

## 2020-11-29 ENCOUNTER — Encounter: Payer: Self-pay | Admitting: Internal Medicine

## 2020-11-29 ENCOUNTER — Ambulatory Visit (INDEPENDENT_AMBULATORY_CARE_PROVIDER_SITE_OTHER): Payer: Medicare HMO | Admitting: Internal Medicine

## 2020-11-29 ENCOUNTER — Other Ambulatory Visit: Payer: Self-pay

## 2020-11-29 VITALS — BP 128/70 | HR 65 | Temp 98.0°F | Ht 73.0 in | Wt 252.0 lb

## 2020-11-29 DIAGNOSIS — N1832 Chronic kidney disease, stage 3b: Secondary | ICD-10-CM

## 2020-11-29 DIAGNOSIS — N400 Enlarged prostate without lower urinary tract symptoms: Secondary | ICD-10-CM

## 2020-11-29 DIAGNOSIS — E7849 Other hyperlipidemia: Secondary | ICD-10-CM | POA: Diagnosis not present

## 2020-11-29 DIAGNOSIS — R7303 Prediabetes: Secondary | ICD-10-CM

## 2020-11-29 DIAGNOSIS — Z0001 Encounter for general adult medical examination with abnormal findings: Secondary | ICD-10-CM

## 2020-11-29 DIAGNOSIS — R011 Cardiac murmur, unspecified: Secondary | ICD-10-CM | POA: Insufficient documentation

## 2020-11-29 DIAGNOSIS — Z0000 Encounter for general adult medical examination without abnormal findings: Secondary | ICD-10-CM

## 2020-11-29 DIAGNOSIS — I700 Atherosclerosis of aorta: Secondary | ICD-10-CM

## 2020-11-29 DIAGNOSIS — J449 Chronic obstructive pulmonary disease, unspecified: Secondary | ICD-10-CM

## 2020-11-29 DIAGNOSIS — Z1159 Encounter for screening for other viral diseases: Secondary | ICD-10-CM

## 2020-11-29 DIAGNOSIS — I10 Essential (primary) hypertension: Secondary | ICD-10-CM | POA: Diagnosis not present

## 2020-11-29 DIAGNOSIS — K439 Ventral hernia without obstruction or gangrene: Secondary | ICD-10-CM

## 2020-11-29 DIAGNOSIS — I2510 Atherosclerotic heart disease of native coronary artery without angina pectoris: Secondary | ICD-10-CM | POA: Diagnosis not present

## 2020-11-29 LAB — COMPREHENSIVE METABOLIC PANEL
ALT: 15 U/L (ref 0–53)
AST: 15 U/L (ref 0–37)
Albumin: 4.2 g/dL (ref 3.5–5.2)
Alkaline Phosphatase: 58 U/L (ref 39–117)
BUN: 22 mg/dL (ref 6–23)
CO2: 29 mEq/L (ref 19–32)
Calcium: 9.4 mg/dL (ref 8.4–10.5)
Chloride: 105 mEq/L (ref 96–112)
Creatinine, Ser: 2.07 mg/dL — ABNORMAL HIGH (ref 0.40–1.50)
GFR: 30.04 mL/min — ABNORMAL LOW (ref >60.00)
Glucose, Bld: 114 mg/dL — ABNORMAL HIGH (ref 70–99)
Potassium: 4.7 mEq/L (ref 3.5–5.1)
Sodium: 140 mEq/L (ref 135–145)
Total Bilirubin: 0.9 mg/dL (ref 0.2–1.2)
Total Protein: 6.8 g/dL (ref 6.0–8.3)

## 2020-11-29 LAB — CBC WITH DIFFERENTIAL/PLATELET
Basophils Absolute: 0.1 10*3/uL (ref 0.0–0.1)
Basophils Relative: 1 % (ref 0.0–3.0)
Eosinophils Absolute: 0.2 10*3/uL (ref 0.0–0.7)
Eosinophils Relative: 3 % (ref 0.0–5.0)
HCT: 49.7 % (ref 39.0–52.0)
Hemoglobin: 16.6 g/dL (ref 13.0–17.0)
Lymphocytes Relative: 31.2 % (ref 12.0–46.0)
Lymphs Abs: 2.3 10*3/uL (ref 0.7–4.0)
MCHC: 33.4 g/dL (ref 30.0–36.0)
MCV: 91.7 fl (ref 78.0–100.0)
Monocytes Absolute: 0.4 10*3/uL (ref 0.1–1.0)
Monocytes Relative: 5.7 % (ref 3.0–12.0)
Neutro Abs: 4.4 10*3/uL (ref 1.4–7.7)
Neutrophils Relative %: 59.1 % (ref 43.0–77.0)
Platelets: 165 10*3/uL (ref 150.0–400.0)
RBC: 5.43 Mil/uL (ref 4.22–5.81)
RDW: 14.9 % (ref 11.5–15.5)
WBC: 7.4 10*3/uL (ref 4.0–10.5)

## 2020-11-29 LAB — LIPID PANEL
Cholesterol: 95 mg/dL (ref 0–200)
HDL: 40.3 mg/dL (ref >39.00)
LDL Cholesterol: 36 mg/dL (ref 0–99)
NonHDL: 55.06
Total CHOL/HDL Ratio: 2
Triglycerides: 97 mg/dL (ref 0.0–149.0)
VLDL: 19.4 mg/dL (ref 0.0–40.0)

## 2020-11-29 LAB — TSH: TSH: 1.89 u[IU]/mL (ref 0.35–4.50)

## 2020-11-29 LAB — VITAMIN D 25 HYDROXY (VIT D DEFICIENCY, FRACTURES): VITD: 38.86 ng/mL (ref 30.00–100.00)

## 2020-11-29 LAB — HEMOGLOBIN A1C: Hgb A1c MFr Bld: 6.3 % (ref 4.6–6.5)

## 2020-11-29 MED ORDER — METOPROLOL TARTRATE 25 MG PO TABS: ORAL_TABLET | ORAL | 3 refills | Status: DC

## 2020-11-29 NOTE — Assessment & Plan Note (Signed)
Chronic Continue atorvastatin 40 mg daily Heart healthy diet and regular exercise encouraged

## 2020-11-29 NOTE — Assessment & Plan Note (Signed)
Chronic Check a1c Low sugar / carb diet Stressed regular exercise  

## 2020-11-29 NOTE — Assessment & Plan Note (Signed)
Chronic No concerning angina like symptoms Continue ASA 81 mg daily, atorvastatin 40 mg daily, metoprolol 12.5 mg bid Cbc, cmp, lipids, tsh

## 2020-11-29 NOTE — Assessment & Plan Note (Signed)
Chronic Asymptomatic Monitor only 

## 2020-11-29 NOTE — Assessment & Plan Note (Addendum)
Chronic Still has frequency and stream Continue flomax 0.4 mg daily Deferred adding second medication

## 2020-11-29 NOTE — Assessment & Plan Note (Signed)
Mild on exam Will monitor clinically

## 2020-11-29 NOTE — Assessment & Plan Note (Signed)
Chronic BP well controlled Continue metoprolol 12.5 mg bid cmp  

## 2020-11-29 NOTE — Assessment & Plan Note (Signed)
Chronic Check lipid panel  Continue atorvastatin 40 mg daily Regular exercise and healthy diet encouraged  

## 2020-11-29 NOTE — Assessment & Plan Note (Signed)
Chronic cmp 

## 2020-11-30 LAB — HEPATITIS C ANTIBODY
Hepatitis C Ab: NONREACTIVE
SIGNAL TO CUT-OFF: 0.01 (ref <1.00)

## 2020-11-30 NOTE — Addendum Note (Signed)
Addended by: Pincus Sanes on: 11/30/2020 08:52 PM   Modules accepted: Orders

## 2020-12-01 ENCOUNTER — Telehealth: Payer: Self-pay | Admitting: Internal Medicine

## 2020-12-01 NOTE — Telephone Encounter (Signed)
Spoke with patient today. 

## 2020-12-01 NOTE — Telephone Encounter (Signed)
Patient calling back to get his lab results.  

## 2020-12-19 ENCOUNTER — Telehealth: Payer: Self-pay | Admitting: Pharmacist

## 2020-12-19 NOTE — Progress Notes (Signed)
    Chronic Care Management Pharmacy Assistant   Name: Preston Boyd  MRN: [id number] DOB: 1941-06-13  Medications: Outpatient Encounter Medications as of 12/19/2020  Medication Sig  . acetaminophen (TYLENOL) 500 MG tablet Take 500 mg by mouth every 6 (six) hours as needed for moderate pain.  Marland Kitchen aspirin EC 81 MG tablet Take 1 tablet (81 mg total) by mouth daily. Swallow whole.  Marland Kitchen atorvastatin (LIPITOR) 40 MG tablet TAKE ONE TABLET BY MOUTH DAILY  . Fluticasone-Salmeterol (ADVAIR DISKUS) 250-50 MCG/DOSE AEPB INHALE 1 PUFF BY MOUTH INTO THE LUNGS TWICE DAILY AS NEEDED FOR SHORTNESS OF BREATH  . metoprolol tartrate (LOPRESSOR) 25 MG tablet TAKE 1/2 TABLET BY MOUTH TWO TIMES A DAY.  Marland Kitchen Olopatadine HCl 0.7 % SOLN Place 1 drop into both eyes daily as needed (for dry eyes).  . Propylene Glycol (SYSTANE BALANCE OP) Place 1 drop into both eyes as needed (for dry eyes).  . tamsulosin (FLOMAX) 0.4 MG CAPS capsule Take 1 capsule (0.4 mg total) by mouth daily.   No facility-administered encounter medications on file as of 12/19/2020.    Pharmacist Review  Reviewed chart for medication changes and adherence.  Recent OV, Consult or Hospital visit: Patient last Dr. Yetta Barre 11/29/20 Recent medication changes indicated: Metoprolol 25 mg 11/29/20  No gaps in adherence identified. Patient has follow up scheduled with pharmacy team. No further action required.  Velvet Bathe Clinical Pharmacist Assistant [phone]  Time spent:8

## 2021-01-11 DIAGNOSIS — E785 Hyperlipidemia, unspecified: Secondary | ICD-10-CM | POA: Diagnosis not present

## 2021-01-11 DIAGNOSIS — D631 Anemia in chronic kidney disease: Secondary | ICD-10-CM | POA: Diagnosis not present

## 2021-01-11 DIAGNOSIS — N1832 Chronic kidney disease, stage 3b: Secondary | ICD-10-CM | POA: Diagnosis not present

## 2021-01-11 DIAGNOSIS — N2581 Secondary hyperparathyroidism of renal origin: Secondary | ICD-10-CM | POA: Diagnosis not present

## 2021-01-11 DIAGNOSIS — I129 Hypertensive chronic kidney disease with stage 1 through stage 4 chronic kidney disease, or unspecified chronic kidney disease: Secondary | ICD-10-CM | POA: Diagnosis not present

## 2021-01-29 DIAGNOSIS — H5213 Myopia, bilateral: Secondary | ICD-10-CM | POA: Diagnosis not present

## 2021-01-29 DIAGNOSIS — H43812 Vitreous degeneration, left eye: Secondary | ICD-10-CM | POA: Diagnosis not present

## 2021-01-29 DIAGNOSIS — H52203 Unspecified astigmatism, bilateral: Secondary | ICD-10-CM | POA: Diagnosis not present

## 2021-01-29 DIAGNOSIS — H1045 Other chronic allergic conjunctivitis: Secondary | ICD-10-CM | POA: Diagnosis not present

## 2021-01-29 DIAGNOSIS — Z961 Presence of intraocular lens: Secondary | ICD-10-CM | POA: Diagnosis not present

## 2021-01-29 DIAGNOSIS — H0288B Meibomian gland dysfunction left eye, upper and lower eyelids: Secondary | ICD-10-CM | POA: Diagnosis not present

## 2021-01-29 DIAGNOSIS — H0288A Meibomian gland dysfunction right eye, upper and lower eyelids: Secondary | ICD-10-CM | POA: Diagnosis not present

## 2021-01-29 DIAGNOSIS — H04123 Dry eye syndrome of bilateral lacrimal glands: Secondary | ICD-10-CM | POA: Diagnosis not present

## 2021-01-29 DIAGNOSIS — H26492 Other secondary cataract, left eye: Secondary | ICD-10-CM | POA: Diagnosis not present

## 2021-01-29 DIAGNOSIS — H524 Presbyopia: Secondary | ICD-10-CM | POA: Diagnosis not present

## 2021-02-12 ENCOUNTER — Telehealth: Payer: Self-pay | Admitting: Pharmacist

## 2021-02-12 NOTE — Progress Notes (Signed)
    Chronic Care Management Pharmacy Assistant   Name: GEORGI NAVARRETE  MRN: [id number] DOB: 10-Apr-1941   Reason for Encounter: Disease State   Conditions to be addressed/monitored: General Call   Recent office visits:  11/29/20 Dr. Cheryll Cockayne (PCP) Encounter for preventative adult health care exam with abnormal findings Blood work was ordered Return in about 6 months   Recent consult visits:  None ID  Hospital visits:  None in previous 6 months  Medications: Outpatient Encounter Medications as of 02/12/2021  Medication Sig   acetaminophen (TYLENOL) 500 MG tablet Take 500 mg by mouth every 6 (six) hours as needed for moderate pain.   aspirin EC 81 MG tablet Take 1 tablet (81 mg total) by mouth daily. Swallow whole.   atorvastatin (LIPITOR) 40 MG tablet TAKE ONE TABLET BY MOUTH DAILY   Fluticasone-Salmeterol (ADVAIR DISKUS) 250-50 MCG/DOSE AEPB INHALE 1 PUFF BY MOUTH INTO THE LUNGS TWICE DAILY AS NEEDED FOR SHORTNESS OF BREATH   metoprolol tartrate (LOPRESSOR) 25 MG tablet TAKE 1/2 TABLET BY MOUTH TWO TIMES A DAY.   Olopatadine HCl 0.7 % SOLN Place 1 drop into both eyes daily as needed (for dry eyes).   Propylene Glycol (SYSTANE BALANCE OP) Place 1 drop into both eyes as needed (for dry eyes).   tamsulosin (FLOMAX) 0.4 MG CAPS capsule Take 1 capsule (0.4 mg total) by mouth daily.   No facility-administered encounter medications on file as of 02/12/2021.    Pharmacist Review  Have you had any problems recently with your health? Patient states that he is doing ok, he does not have any new health issues that he is concerned about at this time  Have you had any problems with your pharmacy? Patient states that he is not having any problems with getting medication or the cost of medication from the pharmacy  What issues or side effects are you having with your medications? Patient states that he does not have any side effects from medications  What would you like me to pass  along to [facility] for them to help you with?  Patient states that right now he is doing fine  What can we do to take care of you better?  Patient states not at this time  Star Rating Drugs: Atorvastatin 40 mg 01/06/21 60 ds  Velvet Bathe Clinical Pharmacist Assistant [phone]   Time spent:21

## 2021-03-07 ENCOUNTER — Other Ambulatory Visit: Payer: Self-pay | Admitting: Internal Medicine

## 2021-03-10 ENCOUNTER — Other Ambulatory Visit: Payer: Self-pay | Admitting: Internal Medicine

## 2021-04-26 ENCOUNTER — Other Ambulatory Visit: Payer: Self-pay

## 2021-04-26 ENCOUNTER — Ambulatory Visit (INDEPENDENT_AMBULATORY_CARE_PROVIDER_SITE_OTHER): Payer: Medicare HMO

## 2021-04-26 VITALS — BP 122/70 | HR 65 | Temp 98.3°F | Ht 73.0 in | Wt 250.6 lb

## 2021-04-26 DIAGNOSIS — Z0000 Encounter for general adult medical examination without abnormal findings: Secondary | ICD-10-CM

## 2021-04-26 NOTE — Patient Instructions (Addendum)
Preston Boyd , Thank you for taking time to come for your Medicare Wellness Visit. I appreciate your ongoing commitment to your health goals. Please review the following plan we discussed and let me know if I can assist you in the future.   Screening recommendations/referrals: Colonoscopy: never done Recommended yearly ophthalmology/optometry visit for glaucoma screening and checkup Recommended yearly dental visit for hygiene and checkup  Vaccinations: Influenza vaccine: 05/10/2020 Pneumococcal vaccine: 05/19/2014, 07/18/2016 Tdap vaccine: 07/07/2009; due every 10 years (overdue) Shingles vaccine: never done   Covid-19: 09/14/2019, 09/25/2019, 07/08/2020  Advanced directives: Advance directive discussed with you today. Even though you declined this today please call our office should you change your mind and we can give you the proper paperwork for you to fill out.  Conditions/risks identified: Yes; no goals at this time.  Next appointment: Please schedule your next Medicare Wellness Visit with your Nurse Health Advisor in 1 year by calling [phone].  Preventive Care 38 Years and Older, Male Preventive care refers to lifestyle choices and visits with your health care provider that can promote health and wellness. What does preventive care include? A yearly physical exam. This is also called an annual well check. Dental exams once or twice a year. Routine eye exams. Ask your health care provider how often you should have your eyes checked. Personal lifestyle choices, including: Daily care of your teeth and gums. Regular physical activity. Eating a healthy diet. Avoiding tobacco and drug use. Limiting alcohol use. Practicing safe sex. Taking low doses of aspirin every day. Taking vitamin and mineral supplements as recommended by your health care provider. What happens during an annual well check? The services and screenings done by your health care provider during your annual well  check will depend on your age, overall health, lifestyle risk factors, and family history of disease. Counseling  Your health care provider may ask you questions about your: Alcohol use. Tobacco use. Drug use. Emotional well-being. Home and relationship well-being. Sexual activity. Eating habits. History of falls. Memory and ability to understand (cognition). Work and work Astronomer. Screening  You may have the following tests or measurements: Height, weight, and BMI. Blood pressure. Lipid and cholesterol levels. These may be checked every 5 years, or more frequently if you are over 37 years old. Skin check. Lung cancer screening. You may have this screening every year starting at age 55 if you have a 30-pack-year history of smoking and currently smoke or have quit within the past 15 years. Fecal occult blood test (FOBT) of the stool. You may have this test every year starting at age 4. Flexible sigmoidoscopy or colonoscopy. You may have a sigmoidoscopy every 5 years or a colonoscopy every 10 years starting at age 72. Prostate cancer screening. Recommendations will vary depending on your family history and other risks. Hepatitis C blood test. Hepatitis B blood test. Sexually transmitted disease (STD) testing. Diabetes screening. This is done by checking your blood sugar (glucose) after you have not eaten for a while (fasting). You may have this done every 1-3 years. Abdominal aortic aneurysm (AAA) screening. You may need this if you are a current or former smoker. Osteoporosis. You may be screened starting at age 105 if you are at high risk. Talk with your health care provider about your test results, treatment options, and if necessary, the need for more tests. Vaccines  Your health care provider may recommend certain vaccines, such as: Influenza vaccine. This is recommended every year. Tetanus, diphtheria, and acellular pertussis (Tdap,  Td) vaccine. You may need a Td booster  every 10 years. Zoster vaccine. You may need this after age 106. Pneumococcal 13-valent conjugate (PCV13) vaccine. One dose is recommended after age 57. Pneumococcal polysaccharide (PPSV23) vaccine. One dose is recommended after age 34. Talk to your health care provider about which screenings and vaccines you need and how often you need them. This information is not intended to replace advice given to you by your health care provider. Make sure you discuss any questions you have with your health care provider. Document Released: 08/04/2015 Document Revised: 03/27/2016 Document Reviewed: 05/09/2015 Elsevier Interactive Patient Education  2017 [place] Prevention in the Home Falls can cause injuries. They can happen to people of all ages. There are many things you can do to make your home safe and to help prevent falls. What can I do on the outside of my home? Regularly fix the edges of walkways and driveways and fix any cracks. Remove anything that might make you trip as you walk through a door, such as a raised step or threshold. Trim any bushes or trees on the path to your home. Use bright outdoor lighting. Clear any walking paths of anything that might make someone trip, such as rocks or tools. Regularly check to see if handrails are loose or broken. Make sure that both sides of any steps have handrails. Any raised decks and porches should have guardrails on the edges. Have any leaves, snow, or ice cleared regularly. Use sand or salt on walking paths during winter. Clean up any spills in your garage right away. This includes oil or grease spills. What can I do in the bathroom? Use night lights. Install grab bars by the toilet and in the tub and shower. Do not use towel bars as grab bars. Use non-skid mats or decals in the tub or shower. If you need to sit down in the shower, use a plastic, non-slip stool. Keep the floor dry. Clean up any water that spills on the floor as soon  as it happens. Remove soap buildup in the tub or shower regularly. Attach bath mats securely with double-sided non-slip rug tape. Do not have throw rugs and other things on the floor that can make you trip. What can I do in the bedroom? Use night lights. Make sure that you have a light by your bed that is easy to reach. Do not use any sheets or blankets that are too big for your bed. They should not hang down onto the floor. Have a firm chair that has side arms. You can use this for support while you get dressed. Do not have throw rugs and other things on the floor that can make you trip. What can I do in the kitchen? Clean up any spills right away. Avoid walking on wet floors. Keep items that you use a lot in easy-to-reach places. If you need to reach something above you, use a strong step stool that has a grab bar. Keep electrical cords out of the way. Do not use floor polish or wax that makes floors slippery. If you must use wax, use non-skid floor wax. Do not have throw rugs and other things on the floor that can make you trip. What can I do with my stairs? Do not leave any items on the stairs. Make sure that there are handrails on both sides of the stairs and use them. Fix handrails that are broken or loose. Make sure that handrails are as  long as the stairways. Check any carpeting to make sure that it is firmly attached to the stairs. Fix any carpet that is loose or worn. Avoid having throw rugs at the top or bottom of the stairs. If you do have throw rugs, attach them to the floor with carpet tape. Make sure that you have a light switch at the top of the stairs and the bottom of the stairs. If you do not have them, ask someone to add them for you. What else can I do to help prevent falls? Wear shoes that: Do not have high heels. Have rubber bottoms. Are comfortable and fit you well. Are closed at the toe. Do not wear sandals. If you use a stepladder: Make sure that it is fully  opened. Do not climb a closed stepladder. Make sure that both sides of the stepladder are locked into place. Ask someone to hold it for you, if possible. Clearly mark and make sure that you can see: Any grab bars or handrails. First and last steps. Where the edge of each step is. Use tools that help you move around (mobility aids) if they are needed. These include: Canes. Walkers. Scooters. Crutches. Turn on the lights when you go into a dark area. Replace any light bulbs as soon as they burn out. Set up your furniture so you have a clear path. Avoid moving your furniture around. If any of your floors are uneven, fix them. If there are any pets around you, be aware of where they are. Review your medicines with your doctor. Some medicines can make you feel dizzy. This can increase your chance of falling. Ask your doctor what other things that you can do to help prevent falls. This information is not intended to replace advice given to you by your health care provider. Make sure you discuss any questions you have with your health care provider. Document Released: 05/04/2009 Document Revised: 12/14/2015 Document Reviewed: 08/12/2014 Elsevier Interactive Patient Education  2017 [employer].

## 2021-04-26 NOTE — Progress Notes (Signed)
Subjective:   Preston Boyd is a 80 y.o. male who presents for Medicare Annual/Subsequent preventive examination.  Review of Systems     Cardiac Risk Factors include: advanced age (>68men, >81 women);dyslipidemia;family history of premature cardiovascular disease;hypertension;male gender;obesity (BMI >30kg/m2)     Objective:    Today's Vitals   04/26/21 0844  BP: 122/70  Pulse: 65  Temp: 98.3 F (36.8 C)  SpO2: 99%  Weight: 250 lb 9.6 oz (113.7 kg)  Height: 6\' 1"  (1.854 m)  PainSc: 0-No pain   Body mass index is 33.06 kg/m.  Advanced Directives 04/26/2021 01/05/2020 06/23/2018 06/19/2017 06/21/2015 06/18/2015 06/18/2015  Does Patient Have a Medical Advance Directive? No No No No No No No  Would patient like information on creating a medical advance directive? No - Patient declined No - Patient declined No - Patient declined Yes (ED - Information included in AVS) No - patient declined information - -    Current Medications (verified) Outpatient Encounter Medications as of 04/26/2021  Medication Sig   acetaminophen (TYLENOL) 500 MG tablet Take 500 mg by mouth every 6 (six) hours as needed for moderate pain.   aspirin EC 81 MG tablet Take 1 tablet (81 mg total) by mouth daily. Swallow whole.   atorvastatin (LIPITOR) 40 MG tablet TAKE ONE TABLET BY MOUTH DAILY   fluticasone-salmeterol (ADVAIR DISKUS) 250-50 MCG/ACT AEPB INHALE ONE PUFF BY MOUTH INTO THE LUNGS TWICE A DAY AS NEEDED FOR SHORTNESS OF BREATH   metoprolol tartrate (LOPRESSOR) 25 MG tablet TAKE 1/2 TABLET BY MOUTH TWO TIMES A DAY.   tamsulosin (FLOMAX) 0.4 MG CAPS capsule Take 1 capsule (0.4 mg total) by mouth daily.   Olopatadine HCl 0.7 % SOLN Place 1 drop into both eyes daily as needed (for dry eyes). (Patient not taking: Reported on 04/26/2021)   Propylene Glycol (SYSTANE BALANCE OP) Place 1 drop into both eyes as needed (for dry eyes). (Patient not taking: Reported on 04/26/2021)   No facility-administered  encounter medications on file as of 04/26/2021.    Allergies (verified) Amoxicillin and Other   History: Past Medical History:  Diagnosis Date   Acute cholecystitis s/p lap cholecystectomy 06/14/2015 06/13/2015   BENIGN PROSTATIC HYPERTROPHY    CAD (coronary artery disease) 01/2015   mild on cath 01/2015, med mgmt rec   CKD (chronic kidney disease) stage 3, GFR 30-59 ml/min (HCC)    COPD (chronic obstructive pulmonary disease) (HCC)    DEGENERATIVE JOINT DISEASE, RIGHT HIP    s/p THR 11/2008   GERD    History of cardiovascular stress test 01/2015   Lexiscan Myoview 7/16:  Inferoseptal, inferior and apical ischemia, EF 67%, TID 1.09; intermediate risk   Hypertension    MIGRAINE HEADACHE    Osteoarthritis of knee    s/p B TKR 07/2009   RENAL INSUFFICIENCY    Past Surgical History:  Procedure Laterality Date   CARDIAC CATHETERIZATION N/A 02/15/2015   Procedure: Left Heart Cath and Coronary Angiography;  Surgeon: 02/17/2015, MD;  Location: [facility];  Service: Cardiovascular;  Laterality: N/A;   CATARACT EXTRACTION, BILATERAL  10/2012   CHOLECYSTECTOMY N/A 06/14/2015   Procedure: LAPAROSCOPIC CHOLECYSTECTOMY WITH INTRAOPERATIVE CHOLANGIOGRAM;  Surgeon: 06/16/2015, MD;  Location: [facility];  Service: General;  Laterality: N/A;   INGUINAL HERNIA REPAIR Bilateral 1960's   REPLACEMENT TOTAL KNEE BILATERAL  07/2009   graves   TONSILLECTOMY     TOTAL HIP ARTHROPLASTY  11/2008   Dr. 12/2008   Family  History  Problem Relation Age of Onset   Colon cancer Mother    Lung cancer Father    Stroke Paternal Grandfather    Alcohol abuse Other    Arthritis Other    Diabetes Other    Hypertension Other    Heart disease Other    Heart attack Maternal Uncle    Social History   Socioeconomic History   Marital status: Single    Spouse name: Not on file   Number of children: 0   Years of education: 16   Highest education level: Not on file  Occupational History   Occupation:  Retired  Tobacco Use   Smoking status: Former    Types: Cigarettes    Quit date: 07/22/1962    Years since quitting: 58.8   Smokeless tobacco: Never  Vaping Use   Vaping Use: Never used  Substance and Sexual Activity   Alcohol use: Yes    Alcohol/week: 0.0 standard drinks    Comment: ocassionally   Drug use: No   Sexual activity: Never  Other Topics Concern   Not on file  Social History Narrative   Single, lives alone. Retired from 3rd shift [employer]. 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   Social Determinants of Health   Financial Resource Strain: Low Risk    Difficulty of Paying Living Expenses: Not hard at all  Food Insecurity: No Food Insecurity   Worried About Programme researcher, broadcasting/film/video in the Last Year: Never true   Barista in the Last Year: Never true  Transportation Needs: No Transportation Needs   Lack of Transportation (Medical): No   Lack of Transportation (Non-Medical): No  Physical Activity: Sufficiently Active   Days of Exercise per Week: 7 days   Minutes of Exercise per Session: 30 min  Stress: No Stress Concern Present   Feeling of Stress : Not at all  Social Connections: Socially Isolated   Frequency of Communication with Friends and Family: More than three times a week   Frequency of Social Gatherings with Friends and Family: Once a week   Attends Religious Services: Never   Database administrator or Organizations: No   Attends Engineer, structural: Never   Marital Status: Never married    Tobacco Counseling Counseling given: Not Answered   Clinical Intake:  Pre-visit preparation completed: Yes  Pain : No/denies pain Pain Score: 0-No pain     BMI - recorded: 33.06 Nutritional Status: BMI > 30  Obese Nutritional Risks: None Diabetes: No  How often do you need to have someone help you when you read instructions, pamphlets, or other written materials from your doctor or pharmacy?: 1 - Never What is the last grade  level you completed in school?: Associate's degree  Diabetic? no  Interpreter Needed?: No  Information entered by :: Susie Cassette, LPN   Activities of Daily Living In your present state of health, do you have any difficulty performing the following activities: 04/26/2021  Hearing? Y  Vision? N  Difficulty concentrating or making decisions? N  Walking or climbing stairs? N  Dressing or bathing? N  Doing errands, shopping? N  Preparing Food and eating ? N  Using the Toilet? N  In the past six months, have you accidently leaked urine? N  Do you have problems with loss of bowel control? N  Managing your Medications? N  Managing your Finances? N  Housekeeping or managing your Housekeeping? N  Some recent data might  be hidden    Patient Care Team: Pincus Sanes, MD as PCP - General (Internal Medicine) Jodi Geralds, MD as Consulting Physician (Orthopedic Surgery) [place], [place] Lowella Bandy., MD as Consulting Physician (Urology) Lennette Bihari, MD (Cardiology) Charlott Rakes, MD as Consulting Physician (Gastroenterology) Kathyrn Sheriff, [facility] as Pharmacist (Pharmacist) [facility] as Consulting Physician (Ophthalmology)  Indicate any recent Medical Services you may have received from other than [facility] providers in the past year (date may be approximate).     Assessment:   This is a routine wellness examination for [facility].  Hearing/Vision screen Hearing Screening - Comments:: Patient has decreased hearing loss. No hearing aids; not able to afford them at this time.  Vision Screening - Comments:: Patient wears corrective glasses/contacts.  Eye exam done annually by: [facility]  Dietary issues and exercise activities discussed: Current Exercise Habits: Home exercise routine;Structured exercise class, Type of exercise: walking (walks dog 1.5 miles every dayl; goe to gym 3 times a week for 1.5 hours each.), Time (Minutes): 30, Frequency (Times/Week):  7, Weekly Exercise (Minutes/Week): 210, Intensity: Moderate   Goals Addressed   None   Depression Screen PHQ 2/9 Scores 04/26/2021 01/05/2020 06/23/2018 06/23/2018 06/19/2017 06/17/2016 12/01/2014  PHQ - 2 Score 0 0 0 0 0 0 0  PHQ- 9 Score - - 0 - - - -    Fall Risk Fall Risk  04/26/2021 05/29/2020 01/05/2020 01/05/2020 06/23/2018  Falls in the past year? 0 0 0 0 0  Number falls in past yr: 0 0 0 0 -  Injury with Fall? 0 0 0 0 -  Risk for fall due to : No Fall Risks - No Fall Risks No Fall Risks -  Follow up Falls evaluation completed - Falls evaluation completed - -    FALL RISK PREVENTION PERTAINING TO THE HOME:  Any stairs in or around the home? No  If so, are there any without handrails? No  Home free of loose throw rugs in walkways, pet beds, electrical cords, etc? Yes  Adequate lighting in your home to reduce risk of falls? Yes   ASSISTIVE DEVICES UTILIZED TO PREVENT FALLS:  Life alert? No  Use of a cane, walker or w/c? No  Grab bars in the bathroom? No  Shower chair or bench in shower? No  Elevated toilet seat or a handicapped toilet? No   TIMED UP AND GO:  Was the test performed? Yes .  Length of time to ambulate 10 feet: 5 sec.   Gait steady and fast without use of assistive device  Cognitive Function: Normal cognitive status assessed by direct observation by this Nurse Health Advisor. No abnormalities found.   MMSE - Mini Mental State Exam 06/19/2017  Orientation to time 5  Orientation to Place 5  Registration 3  Attention/ Calculation 4  Recall 2  Language- name 2 objects 2  Language- repeat 1  Language- follow 3 step command 3  Language- read & follow direction 1  Write a sentence 1  Copy design 1  Total score 28     6CIT Screen 01/05/2020  What Year? 0 points  What month? 0 points  What time? 0 points  Count back from 20 0 points  Months in reverse 0 points  Repeat phrase 2 points  Total Score 2    Immunizations Immunization History   Administered Date(s) Administered   Influenza Split 05/21/2012   Influenza Whole 07/07/2009   Influenza, High Dose Seasonal PF 05/18/2013, 06/12/2015,  06/17/2016, 06/19/2017, 06/23/2018, 05/10/2019   Influenza,inj,Quad PF,6+ Mos 05/19/2014   Influenza-Unspecified 05/10/2020   PFIZER(Purple Top)SARS-COV-2 Vaccination 09/14/2019, 09/25/2019   Pneumococcal Conjugate-13 05/19/2014   Pneumococcal Polysaccharide-23 07/18/2016   Td 07/07/2009   Zoster, Live 03/07/2011    TDAP status: Due, Education has been provided regarding the importance of this vaccine. Advised may receive this vaccine at local pharmacy or Health Dept. Aware to provide a copy of the vaccination record if obtained from local pharmacy or Health Dept. Verbalized acceptance and understanding.  Flu Vaccine status: Due, Education has been provided regarding the importance of this vaccine. Advised may receive this vaccine at local pharmacy or Health Dept. Aware to provide a copy of the vaccination record if obtained from local pharmacy or Health Dept. Verbalized acceptance and understanding.  Pneumococcal vaccine status: Up to date  Covid-19 vaccine status: Completed vaccines  Qualifies for Shingles Vaccine? Yes   Zostavax completed Yes   Shingrix Completed?: No.    Education has been provided regarding the importance of this vaccine. Patient has been advised to call insurance company to determine out of pocket expense if they have not yet received this vaccine. Advised may also receive vaccine at local pharmacy or Health Dept. Verbalized acceptance and understanding.  Screening Tests Health Maintenance  Topic Date Due   Zoster Vaccines- Shingrix (1 of 2) Never done   COVID-19 Vaccine (3 - Booster) 02/25/2020   INFLUENZA VACCINE  02/19/2021   TETANUS/TDAP  11/29/2021 (Originally 07/08/2019)   Hepatitis C Screening  Completed   HPV VACCINES  Aged Out    Health Maintenance  Health Maintenance Due  Topic Date Due    Zoster Vaccines- Shingrix (1 of 2) Never done   COVID-19 Vaccine (3 - Booster) 02/25/2020   INFLUENZA VACCINE  02/19/2021    Colorectal cancer screening: No longer required.   Lung Cancer Screening: (Low Dose CT Chest recommended if Age 9-80 years, 30 pack-year currently smoking OR have quit w/in 15years.) does not qualify.   Lung Cancer Screening Referral: no  Additional Screening:  Hepatitis C Screening: does qualify; Completed yes  Vision Screening: Recommended annual ophthalmology exams for early detection of glaucoma and other disorders of the eye. Is the patient up to date with their annual eye exam?  Yes  Who is the provider or what is the name of the office in which the patient attends annual eye exams? [facility]  If pt is not established with a provider, would they like to be referred to a provider to establish care? No .   Dental Screening: Recommended annual dental exams for proper oral hygiene  Community Resource Referral / Chronic Care Management: CRR required this visit?  No   CCM required this visit?  No      Plan:     I have personally reviewed and noted the following in the patient's chart:   Medical and social history Use of alcohol, tobacco or illicit drugs  Current medications and supplements including opioid prescriptions. Patient is not currently taking opioid prescriptions. Functional ability and status Nutritional status Physical activity Advanced directives List of other physicians Hospitalizations, surgeries, and ER visits in previous 12 months Vitals Screenings to include cognitive, depression, and falls Referrals and appointments  In addition, I have reviewed and discussed with patient certain preventive protocols, quality metrics, and best practice recommendations. A written personalized care plan for preventive services as well as general preventive health recommendations were provided to patient.     Mickeal Needy,  LPN   [id number]   Nurse Notes:  Hearing Screening - Comments:: Patient has decreased hearing loss. No hearing aids; not able to afford them at this time.  Vision Screening - Comments:: Patient wears corrective glasses/contacts.  Eye exam done annually by: [facility]

## 2021-05-17 DIAGNOSIS — N2581 Secondary hyperparathyroidism of renal origin: Secondary | ICD-10-CM | POA: Diagnosis not present

## 2021-05-17 DIAGNOSIS — N1832 Chronic kidney disease, stage 3b: Secondary | ICD-10-CM | POA: Diagnosis not present

## 2021-05-17 DIAGNOSIS — I129 Hypertensive chronic kidney disease with stage 1 through stage 4 chronic kidney disease, or unspecified chronic kidney disease: Secondary | ICD-10-CM | POA: Diagnosis not present

## 2021-05-17 DIAGNOSIS — D631 Anemia in chronic kidney disease: Secondary | ICD-10-CM | POA: Diagnosis not present

## 2021-05-17 DIAGNOSIS — N189 Chronic kidney disease, unspecified: Secondary | ICD-10-CM | POA: Diagnosis not present

## 2021-05-31 ENCOUNTER — Other Ambulatory Visit: Payer: Self-pay | Admitting: Internal Medicine

## 2021-06-05 ENCOUNTER — Encounter: Payer: Self-pay | Admitting: Internal Medicine

## 2021-06-05 ENCOUNTER — Ambulatory Visit (INDEPENDENT_AMBULATORY_CARE_PROVIDER_SITE_OTHER): Payer: Medicare HMO | Admitting: Internal Medicine

## 2021-06-05 ENCOUNTER — Other Ambulatory Visit: Payer: Self-pay

## 2021-06-05 VITALS — BP 108/80 | HR 84 | Temp 97.9°F | Ht 73.0 in | Wt 245.0 lb

## 2021-06-05 DIAGNOSIS — I2510 Atherosclerotic heart disease of native coronary artery without angina pectoris: Secondary | ICD-10-CM

## 2021-06-05 DIAGNOSIS — Z23 Encounter for immunization: Secondary | ICD-10-CM

## 2021-06-05 DIAGNOSIS — N1832 Chronic kidney disease, stage 3b: Secondary | ICD-10-CM

## 2021-06-05 DIAGNOSIS — I700 Atherosclerosis of aorta: Secondary | ICD-10-CM | POA: Diagnosis not present

## 2021-06-05 DIAGNOSIS — R7303 Prediabetes: Secondary | ICD-10-CM

## 2021-06-05 DIAGNOSIS — E7849 Other hyperlipidemia: Secondary | ICD-10-CM | POA: Diagnosis not present

## 2021-06-05 DIAGNOSIS — N400 Enlarged prostate without lower urinary tract symptoms: Secondary | ICD-10-CM | POA: Diagnosis not present

## 2021-06-05 DIAGNOSIS — I10 Essential (primary) hypertension: Secondary | ICD-10-CM | POA: Diagnosis not present

## 2021-06-05 LAB — COMPREHENSIVE METABOLIC PANEL
ALT: 17 U/L (ref 0–53)
AST: 14 U/L (ref 0–37)
Albumin: 4.3 g/dL (ref 3.5–5.2)
Alkaline Phosphatase: 62 U/L (ref 39–117)
BUN: 23 mg/dL (ref 6–23)
CO2: 28 mEq/L (ref 19–32)
Calcium: 9.7 mg/dL (ref 8.4–10.5)
Chloride: 102 mEq/L (ref 96–112)
Creatinine, Ser: 1.71 mg/dL — ABNORMAL HIGH (ref 0.40–1.50)
GFR: 37.65 mL/min — ABNORMAL LOW (ref >60.00)
Glucose, Bld: 121 mg/dL — ABNORMAL HIGH (ref 70–99)
Potassium: 4.8 mEq/L (ref 3.5–5.1)
Sodium: 137 mEq/L (ref 135–145)
Total Bilirubin: 1.7 mg/dL — ABNORMAL HIGH (ref 0.2–1.2)
Total Protein: 7.3 g/dL (ref 6.0–8.3)

## 2021-06-05 LAB — HEMOGLOBIN A1C: Hgb A1c MFr Bld: 6.5 % (ref 4.6–6.5)

## 2021-06-05 LAB — LIPID PANEL
Cholesterol: 111 mg/dL (ref 0–200)
HDL: 50.1 mg/dL (ref >39.00)
LDL Cholesterol: 46 mg/dL (ref 0–99)
NonHDL: 61.13
Total CHOL/HDL Ratio: 2
Triglycerides: 75 mg/dL (ref 0.0–149.0)
VLDL: 15 mg/dL (ref 0.0–40.0)

## 2021-06-05 LAB — CBC WITH DIFFERENTIAL/PLATELET
Basophils Absolute: 0.1 10*3/uL (ref 0.0–0.1)
Basophils Relative: 0.9 % (ref 0.0–3.0)
Eosinophils Absolute: 0.2 10*3/uL (ref 0.0–0.7)
Eosinophils Relative: 2.2 % (ref 0.0–5.0)
HCT: 49.2 % (ref 39.0–52.0)
Hemoglobin: 16.3 g/dL (ref 13.0–17.0)
Lymphocytes Relative: 21.1 % (ref 12.0–46.0)
Lymphs Abs: 2.2 10*3/uL (ref 0.7–4.0)
MCHC: 33.3 g/dL (ref 30.0–36.0)
MCV: 92.1 fl (ref 78.0–100.0)
Monocytes Absolute: 1 10*3/uL (ref 0.1–1.0)
Monocytes Relative: 9.4 % (ref 3.0–12.0)
Neutro Abs: 6.9 10*3/uL (ref 1.4–7.7)
Neutrophils Relative %: 66.4 % (ref 43.0–77.0)
Platelets: 204 10*3/uL (ref 150.0–400.0)
RBC: 5.34 Mil/uL (ref 4.22–5.81)
RDW: 14.6 % (ref 11.5–15.5)
WBC: 10.4 10*3/uL (ref 4.0–10.5)

## 2021-06-05 NOTE — Assessment & Plan Note (Signed)
Chronic Mild disease cardiac catheterization 2016 No symptoms consistent with angina Continue atorvastatin 40 mg daily, aspirin 81 mg daily, metoprolol 12.5 mg twice daily CBC, CMP, lipid panel

## 2021-06-05 NOTE — Assessment & Plan Note (Signed)
Chronic CMP 

## 2021-06-05 NOTE — Progress Notes (Signed)
Subjective:    Patient ID: Preston Boyd, male    DOB: 03/07/1941, 80 y.o.   MRN: [id number]  This visit occurred during the SARS-CoV-2 public health emergency.  Safety protocols were in place, including screening questions prior to the visit, additional usage of staff PPE, and extensive cleaning of exam room while observing appropriate contact time as indicated for disinfecting solutions.     HPI The patient is here for follow up of their chronic medical problems, including CAD, HTN, HLD, prediabetes, CKD, COPD  He is getting over a cold - had it for two weeks.  Symptoms are improving.     Medications and allergies reviewed with patient and updated if appropriate.  Patient Active Problem List   Diagnosis Date Noted   Cardiac murmur 11/29/2020   Snores 12/24/2018   Chronic right flank pain 12/18/2017   Hearing loss 06/19/2017   Ventral hernia 06/19/2017   Prediabetes 06/18/2017   Aortic atherosclerosis ([place]) 04/03/2017   Left inguinal hernia 04/03/2017   CAD (coronary artery disease) 01/20/2015   Hyperlipidemia 12/05/2014   CKD (chronic kidney disease) stage 3, GFR 30-59 ml/min (HCC)    COPD (chronic obstructive pulmonary disease) ([place])    MIGRAINE HEADACHE 07/07/2009   Hypertension 07/07/2009   Nodular hyperplasia of prostate gland 07/07/2009   DEGENERATIVE JOINT DISEASE, RIGHT HIP 07/07/2009    Current Outpatient Medications on File Prior to Visit  Medication Sig Dispense Refill   acetaminophen (TYLENOL) 500 MG tablet Take 500 mg by mouth every 6 (six) hours as needed for moderate pain.     aspirin EC 81 MG tablet Take 1 tablet (81 mg total) by mouth daily. Swallow whole. 30 tablet 11   atorvastatin (LIPITOR) 40 MG tablet TAKE ONE TABLET BY MOUTH DAILY 60 tablet 1   fluticasone-salmeterol (ADVAIR DISKUS) 250-50 MCG/ACT AEPB INHALE ONE PUFF BY MOUTH INTO THE LUNGS TWICE A DAY AS NEEDED FOR SHORTNESS OF BREATH 60 each 5   metoprolol tartrate (LOPRESSOR) 25 MG  tablet TAKE 1/2 TABLET BY MOUTH TWO TIMES A DAY. 90 tablet 3   tamsulosin (FLOMAX) 0.4 MG CAPS capsule TAKE ONE CAPSULE BY MOUTH DAILY 90 capsule 3   No current facility-administered medications on file prior to visit.    Past Medical History:  Diagnosis Date   Acute cholecystitis s/p lap cholecystectomy 06/14/2015 06/13/2015   BENIGN PROSTATIC HYPERTROPHY    CAD (coronary artery disease) 01/2015   mild on cath 01/2015, med mgmt rec   CKD (chronic kidney disease) stage 3, GFR 30-59 ml/min (HCC)    COPD (chronic obstructive pulmonary disease) ([place])    DEGENERATIVE JOINT DISEASE, RIGHT HIP    s/p THR 11/2008   GERD    History of cardiovascular stress test 01/2015   Lexiscan Myoview 7/16:  Inferoseptal, inferior and apical ischemia, EF 67%, TID 1.09; intermediate risk   Hypertension    MIGRAINE HEADACHE    Osteoarthritis of knee    s/p B TKR 07/2009   RENAL INSUFFICIENCY     Past Surgical History:  Procedure Laterality Date   CARDIAC CATHETERIZATION N/A 02/15/2015   Procedure: Left Heart Cath and Coronary Angiography;  Surgeon: Troy Sine, MD;  Location: [facility];  Service: Cardiovascular;  Laterality: N/A;   CATARACT EXTRACTION, BILATERAL  10/2012   CHOLECYSTECTOMY N/A 06/14/2015   Procedure: LAPAROSCOPIC CHOLECYSTECTOMY WITH INTRAOPERATIVE CHOLANGIOGRAM;  Surgeon: Michael Boston, MD;  Location: [facility];  Service: General;  Laterality: N/A;   INGUINAL HERNIA REPAIR  Bilateral 1960's   REPLACEMENT TOTAL KNEE BILATERAL  07/2009   graves   TONSILLECTOMY     TOTAL HIP ARTHROPLASTY  11/2008   Dr. Luiz Blare    Social History   Socioeconomic History   Marital status: Single    Spouse name: Not on file   Number of children: 0   Years of education: 16   Highest education level: Not on file  Occupational History   Occupation: Retired  Tobacco Use   Smoking status: Former    Types: Cigarettes    Quit date: 07/22/1962    Years since quitting: 58.9   Smokeless tobacco: Never   Vaping Use   Vaping Use: Never used  Substance and Sexual Activity   Alcohol use: Yes    Alcohol/week: 0.0 standard drinks    Comment: ocassionally   Drug use: No   Sexual activity: Never  Other Topics Concern   Not on file  Social History Narrative   Single, lives alone. Retired from 3rd shift [employer]. 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   Social Determinants of Health   Financial Resource Strain: Low Risk    Difficulty of Paying Living Expenses: Not hard at all  Food Insecurity: No Food Insecurity   Worried About Programme researcher, broadcasting/film/video in the Last Year: Never true   Barista in the Last Year: Never true  Transportation Needs: No Transportation Needs   Lack of Transportation (Medical): No   Lack of Transportation (Non-Medical): No  Physical Activity: Sufficiently Active   Days of Exercise per Week: 7 days   Minutes of Exercise per Session: 30 min  Stress: No Stress Concern Present   Feeling of Stress : Not at all  Social Connections: Socially Isolated   Frequency of Communication with Friends and Family: More than three times a week   Frequency of Social Gatherings with Friends and Family: Once a week   Attends Religious Services: Never   Database administrator or Organizations: No   Attends Engineer, structural: Never   Marital Status: Never married    Family History  Problem Relation Age of Onset   Colon cancer Mother    Lung cancer Father    Stroke Paternal Grandfather    Alcohol abuse Other    Arthritis Other    Diabetes Other    Hypertension Other    Heart disease Other    Heart attack Maternal Uncle     Review of Systems  Constitutional:  Positive for chills. Negative for fever.  Respiratory:  Positive for cough (improved) and shortness of breath (chronic). Negative for chest tightness and wheezing.   Cardiovascular:  Negative for chest pain, palpitations and leg swelling.  Neurological:  Positive for dizziness (occ) and  headaches (sinus HA occ).      Objective:   Vitals:   06/05/21 0810  BP: 108/80  Pulse: 84  Temp: 97.9 F (36.6 C)  SpO2: 99%   BP Readings from Last 3 Encounters:  06/05/21 108/80  04/26/21 122/70  11/29/20 128/70   Wt Readings from Last 3 Encounters:  06/05/21 245 lb (111.1 kg)  04/26/21 250 lb 9.6 oz (113.7 kg)  11/29/20 252 lb (114.3 kg)   Body mass index is 32.32 kg/m.   Physical Exam    Constitutional: Appears well-developed and well-nourished. No distress.  HENT:  Head: Normocephalic and atraumatic.  Neck: Neck supple. No tracheal deviation present. No thyromegaly present.  No cervical lymphadenopathy Cardiovascular: Normal  rate, regular rhythm and normal heart sounds.   No murmur heard. No carotid bruit .  No edema Pulmonary/Chest: Effort normal and breath sounds normal. No respiratory distress. No has no wheezes. No rales.  Skin: Skin is warm and dry. Not diaphoretic.  Psychiatric: Normal mood and affect. Behavior is normal.      Assessment & Plan:    See Problem List for Assessment and Plan of chronic medical problems.

## 2021-06-05 NOTE — Assessment & Plan Note (Addendum)
Chronic Regular exercise and healthy diet encouraged Check lipid panel  Continue atorvastatin 40 mg daily 

## 2021-06-05 NOTE — Assessment & Plan Note (Signed)
Chronic Blood pressure well controlled CMP Continue metoprolol 12.5 mg twice daily 

## 2021-06-05 NOTE — Assessment & Plan Note (Signed)
Chronic Continue atorvastatin 40 mg daily Check lipid panel Encouraged healthy diet and regular exercise

## 2021-06-05 NOTE — Addendum Note (Signed)
Addended by: Madelon Lips on: 06/05/2021 08:52 AM   Modules accepted: Orders

## 2021-06-05 NOTE — Assessment & Plan Note (Signed)
Chronic Check a1c Low sugar / carb diet Stressed regular exercise  

## 2021-06-05 NOTE — Assessment & Plan Note (Signed)
Chronic Controlled, stable Continue flomax 0.4 mg daily  

## 2021-06-05 NOTE — Patient Instructions (Addendum)
   Consider getting the shingles vaccine at your pharmacy.  Consider getting the COVID booster at your pharmacy.   Flu immunization administered today.     Blood work was ordered.     Medications changes include :   none    Please followup in 6 months

## 2021-06-05 NOTE — Addendum Note (Signed)
Addended by: Karma Ganja on: 06/05/2021 04:47 PM   Modules accepted: Orders

## 2021-06-09 ENCOUNTER — Other Ambulatory Visit: Payer: Self-pay | Admitting: Internal Medicine

## 2021-10-04 ENCOUNTER — Telehealth: Payer: Self-pay

## 2021-10-04 NOTE — Progress Notes (Signed)
? ? ?  Chronic Care Management ?Pharmacy Assistant  ? ?Name: Preston Boyd  MRN: [id number] DOB: [birth year] ? ?Preston Boyd is an 81 y.o. year old male who presents for his follow-up CCM visit with the clinical pharmacist. ? ?Reason for Encounter: Disease State-General ?  ? ?Recent office visits:  ?06/05/21 Pincus Sanes, MD-PCP (CAD) Blood work was ordered.Medications changes include :   none ? ?Recent consult visits:  ?None ID ? ?Hospital visits:  ?None in previous 6 months ? ?Medications: ?Outpatient Encounter Medications as of 10/04/2021  ?Medication Sig  ? acetaminophen (TYLENOL) 500 MG tablet Take 500 mg by mouth every 6 (six) hours as needed for moderate pain.  ? aspirin EC 81 MG tablet Take 1 tablet (81 mg total) by mouth daily. Swallow whole.  ? atorvastatin (LIPITOR) 40 MG tablet TAKE ONE TABLET BY MOUTH DAILY  ? fluticasone-salmeterol (ADVAIR DISKUS) 250-50 MCG/ACT AEPB INHALE ONE PUFF BY MOUTH INTO THE LUNGS TWICE A DAY AS NEEDED FOR SHORTNESS OF BREATH  ? metoprolol tartrate (LOPRESSOR) 25 MG tablet TAKE 1/2 TABLET BY MOUTH TWO TIMES A DAY.  ? tamsulosin (FLOMAX) 0.4 MG CAPS capsule TAKE ONE CAPSULE BY MOUTH DAILY  ? ?No facility-administered encounter medications on file as of 10/04/2021.  ? ?Have you had any problems recently with your health?Patient states that he is doing well and there has been no changes with medications ? ?Have you had any problems with your pharmacy?Patient states that he is not having any problems with getting medications or the cost of medications from the pharmacy ? ?What issues or side effects are you having with your medications?Patient states that he is not having any side effects from medications ? ?What would you like me to pass along to Toma Aran clinical pharmacist for them to help you with? Patient states that he is doing fine ? ?What can we do to take care of you better? Patient states that he does not need anything at this time ? ?Care  Gaps: ?Colonoscopy-NA ?Diabetic Foot Exam-NA ?Ophthalmology-NA ?Dexa Scan - NA ?Annual Well Visit - 11/29/20 ?Micro albumin-NA ?Hemoglobin A1c- 11/158/22 ? ?Star Rating Drugs: ?Atorvastatin 40 mg-last fill 06/11/21 90 ds ? ?Velvet Bathe ?Clinical Pharmacist Assistant ?[phone]  ?

## 2021-12-03 ENCOUNTER — Encounter: Payer: Self-pay | Admitting: Internal Medicine

## 2021-12-03 NOTE — Progress Notes (Signed)
? ? ? ? ?Subjective:  ? ? Patient ID: Preston Boyd, male    DOB: Dec 06, 1940, 81 y.o.   MRN: [id number] ? ? ? ? ?HPI ?Preston Boyd is here for follow up of his chronic medical problems, including CAD, htn, hld, prediabetes, CKD, COPD, BPH ? ?Going to gym.  Walking dog.   ? ?Medications and allergies reviewed with patient and updated if appropriate. ? ?Current Outpatient Medications on File Prior to Visit  ?Medication Sig Dispense Refill  ? acetaminophen (TYLENOL) 500 MG tablet Take 500 mg by mouth every 6 (six) hours as needed for moderate pain.    ? aspirin EC 81 MG tablet Take 1 tablet (81 mg total) by mouth daily. Swallow whole. 30 tablet 11  ? atorvastatin (LIPITOR) 40 MG tablet TAKE ONE TABLET BY MOUTH DAILY 90 tablet 2  ? fluticasone-salmeterol (ADVAIR DISKUS) 250-50 MCG/ACT AEPB INHALE ONE PUFF BY MOUTH INTO THE LUNGS TWICE A DAY AS NEEDED FOR SHORTNESS OF BREATH 60 each 5  ? metoprolol tartrate (LOPRESSOR) 25 MG tablet TAKE 1/2 TABLET BY MOUTH TWO TIMES A DAY. 90 tablet 3  ? tamsulosin (FLOMAX) 0.4 MG CAPS capsule TAKE ONE CAPSULE BY MOUTH DAILY 90 capsule 3  ? ?No current facility-administered medications on file prior to visit.  ? ? ? ?Review of Systems  ?Constitutional:  Negative for fever.  ?Respiratory:  Positive for cough (occ), shortness of breath (occ) and wheezing (occ).   ?Cardiovascular:  Positive for leg swelling. Negative for chest pain and palpitations.  ?Neurological:  Positive for dizziness (occ) and light-headedness (occ). Negative for headaches.  ? ?   ?Objective:  ? ?Vitals:  ? 12/04/21 0921  ?BP: 114/76  ?Pulse: (!) 54  ?Temp: 97.7 ?F (36.5 ?C)  ?SpO2: 98%  ? ?BP Readings from Last 3 Encounters:  ?12/04/21 114/76  ?06/05/21 108/80  ?04/26/21 122/70  ? ?Wt Readings from Last 3 Encounters:  ?12/04/21 241 lb (109.3 kg)  ?06/05/21 245 lb (111.1 kg)  ?04/26/21 250 lb 9.6 oz (113.7 kg)  ? ?Body mass index is 31.8 kg/m?. ? ?  ?Physical Exam ?Constitutional:   ?   General: He is not in acute  distress. ?   Appearance: Normal appearance. He is not ill-appearing.  ?HENT:  ?   Head: Normocephalic and atraumatic.  ?Eyes:  ?   Conjunctiva/sclera: Conjunctivae normal.  ?Cardiovascular:  ?   Rate and Rhythm: Normal rate and regular rhythm.  ?   Heart sounds: Normal heart sounds. No murmur heard. ?Pulmonary:  ?   Effort: Pulmonary effort is normal. No respiratory distress.  ?   Breath sounds: Normal breath sounds. No wheezing or rales.  ?Musculoskeletal:  ?   Right lower leg: No edema.  ?   Left lower leg: No edema.  ?Skin: ?   General: Skin is warm and dry.  ?   Findings: No rash.  ?Neurological:  ?   Mental Status: He is alert. Mental status is at baseline.  ?Psychiatric:     ?   Mood and Affect: Mood normal.  ? ?   ? ?Lab Results  ?Component Value Date  ? WBC 10.4 06/05/2021  ? HGB 16.3 06/05/2021  ? HCT 49.2 06/05/2021  ? PLT 204.0 06/05/2021  ? GLUCOSE 121 (H) 06/05/2021  ? CHOL 111 06/05/2021  ? TRIG 75.0 06/05/2021  ? HDL 50.10 06/05/2021  ? [place] 46 06/05/2021  ? ALT 17 06/05/2021  ? AST 14 06/05/2021  ? NA 137 06/05/2021  ? K 4.8  06/05/2021  ? CL 102 06/05/2021  ? CREATININE 1.71 (H) 06/05/2021  ? BUN 23 06/05/2021  ? CO2 28 06/05/2021  ? TSH 1.89 11/29/2020  ? PSA 2.31 03/07/2011  ? INR 1.24 06/14/2015  ? HGBA1C 6.5 06/05/2021  ? ? ? ?Assessment & Plan:  ? ? ?See Problem List for Assessment and Plan of chronic medical problems.  ? ? ?

## 2021-12-03 NOTE — Patient Instructions (Addendum)
? ? ? ?  Blood work was ordered.   ? ? ?Medications changes include :   None ? ? ?Your prescription(s) have been sent to your pharmacy.  ? ? ? ?Return in about 6 months (around 06/06/2022) for follow up. ? ?

## 2021-12-04 ENCOUNTER — Ambulatory Visit (INDEPENDENT_AMBULATORY_CARE_PROVIDER_SITE_OTHER): Payer: Medicare HMO | Admitting: Internal Medicine

## 2021-12-04 VITALS — BP 114/76 | HR 54 | Temp 97.7°F | Ht 73.0 in | Wt 241.0 lb

## 2021-12-04 DIAGNOSIS — E7849 Other hyperlipidemia: Secondary | ICD-10-CM

## 2021-12-04 DIAGNOSIS — R7303 Prediabetes: Secondary | ICD-10-CM | POA: Diagnosis not present

## 2021-12-04 DIAGNOSIS — J449 Chronic obstructive pulmonary disease, unspecified: Secondary | ICD-10-CM | POA: Diagnosis not present

## 2021-12-04 DIAGNOSIS — I10 Essential (primary) hypertension: Secondary | ICD-10-CM

## 2021-12-04 DIAGNOSIS — I700 Atherosclerosis of aorta: Secondary | ICD-10-CM | POA: Diagnosis not present

## 2021-12-04 DIAGNOSIS — N400 Enlarged prostate without lower urinary tract symptoms: Secondary | ICD-10-CM | POA: Diagnosis not present

## 2021-12-04 DIAGNOSIS — N1832 Chronic kidney disease, stage 3b: Secondary | ICD-10-CM | POA: Diagnosis not present

## 2021-12-04 DIAGNOSIS — I2510 Atherosclerotic heart disease of native coronary artery without angina pectoris: Secondary | ICD-10-CM | POA: Diagnosis not present

## 2021-12-04 LAB — COMPREHENSIVE METABOLIC PANEL
ALT: 20 U/L (ref 0–53)
AST: 19 U/L (ref 0–37)
Albumin: 4.4 g/dL (ref 3.5–5.2)
Alkaline Phosphatase: 61 U/L (ref 39–117)
BUN: 22 mg/dL (ref 6–23)
CO2: 27 mEq/L (ref 19–32)
Calcium: 9.6 mg/dL (ref 8.4–10.5)
Chloride: 104 mEq/L (ref 96–112)
Creatinine, Ser: 1.6 mg/dL — ABNORMAL HIGH (ref 0.40–1.50)
GFR: 40.63 mL/min — ABNORMAL LOW (ref >60.00)
Glucose, Bld: 105 mg/dL — ABNORMAL HIGH (ref 70–99)
Potassium: 4.3 mEq/L (ref 3.5–5.1)
Sodium: 140 mEq/L (ref 135–145)
Total Bilirubin: 0.9 mg/dL (ref 0.2–1.2)
Total Protein: 7.2 g/dL (ref 6.0–8.3)

## 2021-12-04 LAB — LIPID PANEL
Cholesterol: 107 mg/dL (ref 0–200)
HDL: 44.1 mg/dL (ref >39.00)
LDL Cholesterol: 48 mg/dL (ref 0–99)
NonHDL: 63.27
Total CHOL/HDL Ratio: 2
Triglycerides: 78 mg/dL (ref 0.0–149.0)
VLDL: 15.6 mg/dL (ref 0.0–40.0)

## 2021-12-04 LAB — HEMOGLOBIN A1C: Hgb A1c MFr Bld: 5.9 % (ref 4.6–6.5)

## 2021-12-04 MED ORDER — ATORVASTATIN CALCIUM 40 MG PO TABS: 40.0000 mg | ORAL_TABLET | Freq: Every day | ORAL | 2 refills | Status: DC

## 2021-12-04 MED ORDER — FLUTICASONE-SALMETEROL 250-50 MCG/ACT IN AEPB
INHALATION_SPRAY | RESPIRATORY_TRACT | 5 refills | Status: DC
Start: 2021-12-04 — End: 2022-12-05

## 2021-12-04 MED ORDER — TAMSULOSIN HCL 0.4 MG PO CAPS
0.4000 mg | ORAL_CAPSULE | Freq: Every day | ORAL | 3 refills | Status: DC
Start: 2021-12-04 — End: 2022-12-05

## 2021-12-04 MED ORDER — METOPROLOL TARTRATE 25 MG PO TABS: ORAL_TABLET | ORAL | 3 refills | Status: DC

## 2021-12-04 NOTE — Assessment & Plan Note (Signed)
Chronic ?Has coughing, wheezing and shortness of breath ?Taking Advair daily ? continue Advair ? ?

## 2021-12-04 NOTE — Assessment & Plan Note (Signed)
Chronic Check a1c Low sugar / carb diet Stressed regular exercise  

## 2021-12-04 NOTE — Assessment & Plan Note (Signed)
Chronic Controlled, Stable Continue Flomax 0.4 mg daily 

## 2021-12-04 NOTE — Assessment & Plan Note (Signed)
Chronic Regular exercise and healthy diet encouraged Check lipid panel  Continue atorvastatin 40 mg daily 

## 2021-12-04 NOTE — Assessment & Plan Note (Signed)
Chronic ?Mild disease on cardiac catheterization 2016 ?Denies symptoms consistent with angina ?Continue atorvastatin 40 mg daily, aspirin 81 mg daily, metoprolol 12.5 mg twice daily ?

## 2021-12-04 NOTE — Assessment & Plan Note (Signed)
Chronic ?Continue atorvastatin 40 mg daily ?Has lost weight ?Continue regular exercise ?

## 2021-12-04 NOTE — Assessment & Plan Note (Signed)
Chronic Blood pressure well controlled CMP Continue metoprolol 12.5 mg twice daily 

## 2021-12-04 NOTE — Assessment & Plan Note (Signed)
Chronic CMP 

## 2021-12-05 ENCOUNTER — Telehealth: Payer: Self-pay

## 2021-12-05 NOTE — Telephone Encounter (Signed)
Pt called back for results. I advised pt of result note per Dr. Lawerance Bach states Kidney function is slightly better.  Liver tests are normal.  Your cholesterol is good.  Sugars are very well controlled -a1c is 5.9%.   ? ?No further questions or concerns ? ?FYI ? ?

## 2022-01-31 DIAGNOSIS — H43812 Vitreous degeneration, left eye: Secondary | ICD-10-CM | POA: Diagnosis not present

## 2022-01-31 DIAGNOSIS — H26492 Other secondary cataract, left eye: Secondary | ICD-10-CM | POA: Diagnosis not present

## 2022-01-31 DIAGNOSIS — H0288B Meibomian gland dysfunction left eye, upper and lower eyelids: Secondary | ICD-10-CM | POA: Diagnosis not present

## 2022-01-31 DIAGNOSIS — H0288A Meibomian gland dysfunction right eye, upper and lower eyelids: Secondary | ICD-10-CM | POA: Diagnosis not present

## 2022-01-31 DIAGNOSIS — Z961 Presence of intraocular lens: Secondary | ICD-10-CM | POA: Diagnosis not present

## 2022-01-31 DIAGNOSIS — H04123 Dry eye syndrome of bilateral lacrimal glands: Secondary | ICD-10-CM | POA: Diagnosis not present

## 2022-04-26 ENCOUNTER — Telehealth: Payer: Self-pay | Admitting: Internal Medicine

## 2022-04-26 NOTE — Telephone Encounter (Signed)
Left message for patient to call back to schedule Medicare Annual Wellness Visit   Last AWV  04/26/21  Please schedule at anytime with [facility]-Nurse Health Advisor if patient calls the office back.     Any questions, please call me at [phone]  

## 2022-04-29 ENCOUNTER — Telehealth: Payer: Self-pay | Admitting: Internal Medicine

## 2022-04-29 NOTE — Telephone Encounter (Signed)
LVM for pt to rtn my call to schedule AWV with [facility] call back # [phone] 

## 2022-05-02 DIAGNOSIS — N2581 Secondary hyperparathyroidism of renal origin: Secondary | ICD-10-CM | POA: Diagnosis not present

## 2022-05-02 DIAGNOSIS — N1832 Chronic kidney disease, stage 3b: Secondary | ICD-10-CM | POA: Diagnosis not present

## 2022-05-02 DIAGNOSIS — D631 Anemia in chronic kidney disease: Secondary | ICD-10-CM | POA: Diagnosis not present

## 2022-05-02 DIAGNOSIS — I129 Hypertensive chronic kidney disease with stage 1 through stage 4 chronic kidney disease, or unspecified chronic kidney disease: Secondary | ICD-10-CM | POA: Diagnosis not present

## 2022-05-02 DIAGNOSIS — E785 Hyperlipidemia, unspecified: Secondary | ICD-10-CM | POA: Diagnosis not present

## 2022-06-03 NOTE — Patient Instructions (Signed)
Health Maintenance, Male Adopting a healthy lifestyle and getting preventive care are important in promoting health and wellness. Ask your health care provider about: The right schedule for you to have regular tests and exams. Things you can do on your own to prevent diseases and keep yourself healthy. What should I know about diet, weight, and exercise? Eat a healthy diet  Eat a diet that includes plenty of vegetables, fruits, low-fat dairy products, and lean protein. Do not eat a lot of foods that are high in solid fats, added sugars, or sodium. Maintain a healthy weight Body mass index (BMI) is a measurement that can be used to identify possible weight problems. It estimates body fat based on height and weight. Your health care provider can help determine your BMI and help you achieve or maintain a healthy weight. Get regular exercise Get regular exercise. This is one of the most important things you can do for your health. Most adults should: Exercise for at least 150 minutes each week. The exercise should increase your heart rate and make you sweat (moderate-intensity exercise). Do strengthening exercises at least twice a week. This is in addition to the moderate-intensity exercise. Spend less time sitting. Even light physical activity can be beneficial. Watch cholesterol and blood lipids Have your blood tested for lipids and cholesterol at 80 years of age, then have this test every 5 years. You may need to have your cholesterol levels checked more often if: Your lipid or cholesterol levels are high. You are older than 80 years of age. You are at high risk for heart disease. What should I know about cancer screening? Many types of cancers can be detected early and may often be prevented. Depending on your health history and family history, you may need to have cancer screening at various ages. This may include screening for: Colorectal cancer. Prostate cancer. Skin cancer. Lung  cancer. What should I know about heart disease, diabetes, and high blood pressure? Blood pressure and heart disease High blood pressure causes heart disease and increases the risk of stroke. This is more likely to develop in people who have high blood pressure readings or are overweight. Talk with your health care provider about your target blood pressure readings. Have your blood pressure checked: Every 3-5 years if you are 18-39 years of age. Every year if you are 40 years old or older. If you are between the ages of 65 and 75 and are a current or former smoker, ask your health care provider if you should have a one-time screening for abdominal aortic aneurysm (AAA). Diabetes Have regular diabetes screenings. This checks your fasting blood sugar level. Have the screening done: Once every three years after age 45 if you are at a normal weight and have a low risk for diabetes. More often and at a younger age if you are overweight or have a high risk for diabetes. What should I know about preventing infection? Hepatitis B If you have a higher risk for hepatitis B, you should be screened for this virus. Talk with your health care provider to find out if you are at risk for hepatitis B infection. Hepatitis C Blood testing is recommended for: Everyone born from 1945 through 1965. Anyone with known risk factors for hepatitis C. Sexually transmitted infections (STIs) You should be screened each year for STIs, including gonorrhea and chlamydia, if: You are sexually active and are younger than 80 years of age. You are older than 80 years of age and your   health care provider tells you that you are at risk for this type of infection. Your sexual activity has changed since you were last screened, and you are at increased risk for chlamydia or gonorrhea. Ask your health care provider if you are at risk. Ask your health care provider about whether you are at high risk for HIV. Your health care provider  may recommend a prescription medicine to help prevent HIV infection. If you choose to take medicine to prevent HIV, you should first get tested for HIV. You should then be tested every 3 months for as long as you are taking the medicine. Follow these instructions at home: Alcohol use Do not drink alcohol if your health care provider tells you not to drink. If you drink alcohol: Limit how much you have to 0-2 drinks a day. Know how much alcohol is in your drink. In the [place] one drink equals one 12 oz bottle of beer (355 mL), one 5 oz glass of Petrice Beedy (148 mL), or one 1 oz glass of hard liquor (44 mL). Lifestyle Do not use any products that contain nicotine or tobacco. These products include cigarettes, chewing tobacco, and vaping devices, such as e-cigarettes. If you need help quitting, ask your health care provider. Do not use street drugs. Do not share needles. Ask your health care provider for help if you need support or information about quitting drugs. General instructions Schedule regular health, dental, and eye exams. Stay current with your vaccines. Tell your health care provider if: You often feel depressed. You have ever been abused or do not feel safe at home. Summary Adopting a healthy lifestyle and getting preventive care are important in promoting health and wellness. Follow your health care provider's instructions about healthy diet, exercising, and getting tested or screened for diseases. Follow your health care provider's instructions on monitoring your cholesterol and blood pressure. This information is not intended to replace advice given to you by your health care provider. Make sure you discuss any questions you have with your health care provider. Document Revised: 11/27/2020 Document Reviewed: 11/27/2020 Elsevier Patient Education  2023 [employer]  

## 2022-06-03 NOTE — Progress Notes (Unsigned)
Subjective:   Preston Boyd is a 81 y.o. male who presents for Medicare Annual/Subsequent preventive examination. I connected with  Charna Busman on 06/03/22 by a audio enabled telemedicine application and verified that I am speaking with the correct person using two identifiers.  Patient Location: Home  Provider Location: Home Office  I discussed the limitations of evaluation and management by telemedicine. The patient expressed understanding and agreed to proceed.  Review of Systems    Deferred to PCP       Objective:    There were no vitals filed for this visit. There is no height or weight on file to calculate BMI.     04/26/2021    9:08 AM 01/05/2020    9:02 AM 06/23/2018   11:15 AM 06/19/2017   11:20 AM 06/21/2015    9:17 AM 06/18/2015    5:23 PM 06/18/2015    2:42 AM  Advanced Directives  Does Patient Have a Medical Advance Directive? No No No No No No No  Would patient like information on creating a medical advance directive? No - Patient declined No - Patient declined No - Patient declined Yes (ED - Information included in AVS) No - patient declined information      Current Medications (verified) Outpatient Encounter Medications as of 06/04/2022  Medication Sig   acetaminophen (TYLENOL) 500 MG tablet Take 500 mg by mouth every 6 (six) hours as needed for moderate pain.   aspirin EC 81 MG tablet Take 1 tablet (81 mg total) by mouth daily. Swallow whole.   atorvastatin (LIPITOR) 40 MG tablet Take 1 tablet (40 mg total) by mouth daily.   fluticasone-salmeterol (ADVAIR DISKUS) 250-50 MCG/ACT AEPB INHALE ONE PUFF BY MOUTH INTO THE LUNGS TWICE A DAY AS NEEDED FOR SHORTNESS OF BREATH   metoprolol tartrate (LOPRESSOR) 25 MG tablet TAKE 1/2 TABLET BY MOUTH TWO TIMES A DAY.   tamsulosin (FLOMAX) 0.4 MG CAPS capsule Take 1 capsule (0.4 mg total) by mouth daily.   No facility-administered encounter medications on file as of 06/04/2022.    Allergies  (verified) Amoxicillin and Other   History: Past Medical History:  Diagnosis Date   Acute cholecystitis s/p lap cholecystectomy 06/14/2015 06/13/2015   BENIGN PROSTATIC HYPERTROPHY    CAD (coronary artery disease) 01/2015   mild on cath 01/2015, med mgmt rec   CKD (chronic kidney disease) stage 3, GFR 30-59 ml/min (HCC)    COPD (chronic obstructive pulmonary disease) (HCC)    DEGENERATIVE JOINT DISEASE, RIGHT HIP    s/p THR 11/2008   GERD    History of cardiovascular stress test 01/2015   Lexiscan Myoview 7/16:  Inferoseptal, inferior and apical ischemia, EF 67%, TID 1.09; intermediate risk   Hypertension    MIGRAINE HEADACHE    Osteoarthritis of knee    s/p B TKR 07/2009   RENAL INSUFFICIENCY    Past Surgical History:  Procedure Laterality Date   CARDIAC CATHETERIZATION N/A 02/15/2015   Procedure: Left Heart Cath and Coronary Angiography;  Surgeon: Lennette Bihari, MD;  Location: [facility];  Service: Cardiovascular;  Laterality: N/A;   CATARACT EXTRACTION, BILATERAL  10/2012   CHOLECYSTECTOMY N/A 06/14/2015   Procedure: LAPAROSCOPIC CHOLECYSTECTOMY WITH INTRAOPERATIVE CHOLANGIOGRAM;  Surgeon: Karie Soda, MD;  Location: [facility];  Service: General;  Laterality: N/A;   INGUINAL HERNIA REPAIR Bilateral 1960's   REPLACEMENT TOTAL KNEE BILATERAL  07/2009   graves   TONSILLECTOMY     TOTAL HIP ARTHROPLASTY  11/2008  Dr. Luiz Blare   Family History  Problem Relation Age of Onset   Colon cancer Mother    Lung cancer Father    Stroke Paternal Grandfather    Alcohol abuse Other    Arthritis Other    Diabetes Other    Hypertension Other    Heart disease Other    Heart attack Maternal Uncle    Social History   Socioeconomic History   Marital status: Single    Spouse name: Not on file   Number of children: 0   Years of education: 16   Highest education level: Not on file  Occupational History   Occupation: Retired  Tobacco Use   Smoking status: Former    Types: Cigarettes     Quit date: 07/22/1962    Years since quitting: 59.9   Smokeless tobacco: Never  Vaping Use   Vaping Use: Never used  Substance and Sexual Activity   Alcohol use: Yes    Alcohol/week: 0.0 standard drinks of alcohol    Comment: ocassionally   Drug use: No   Sexual activity: Never  Other Topics Concern   Not on file  Social History Narrative   Single, lives alone. Retired from 3rd shift [employer]. 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   Social Determinants of Health   Financial Resource Strain: Low Risk  (04/26/2021)   Overall Financial Resource Strain (CARDIA)    Difficulty of Paying Living Expenses: Not hard at all  Food Insecurity: No Food Insecurity (04/26/2021)   Hunger Vital Sign    Worried About Running Out of Food in the Last Year: Never true    Ran Out of Food in the Last Year: Never true  Transportation Needs: No Transportation Needs (04/26/2021)   PRAPARE - Administrator, Civil Service (Medical): No    Lack of Transportation (Non-Medical): No  Physical Activity: Sufficiently Active (04/26/2021)   Exercise Vital Sign    Days of Exercise per Week: 7 days    Minutes of Exercise per Session: 30 min  Stress: No Stress Concern Present (04/26/2021)   [employer] of Occupational Health - Occupational Stress Questionnaire    Feeling of Stress : Not at all  Social Connections: Socially Isolated (04/26/2021)   Social Connection and Isolation Panel [NHANES]    Frequency of Communication with Friends and Family: More than three times a week    Frequency of Social Gatherings with Friends and Family: Once a week    Attends Religious Services: Never    Database administrator or Organizations: No    Attends Banker Meetings: Never    Marital Status: Never married    Tobacco Counseling Counseling given: Not Answered   Clinical Intake:                 Diabetic?No         Activities of Daily Living     No data to  display           Patient Care Team: Pincus Sanes, MD as PCP - General (Internal Medicine) Jodi Geralds, MD as Consulting Physician (Orthopedic Surgery) [place], [place] Lowella Bandy., MD as Consulting Physician (Urology) Lennette Bihari, MD (Cardiology) Charlott Rakes, MD as Consulting Physician (Gastroenterology) Kathyrn Sheriff, [facility] as Pharmacist (Pharmacist) [facility] as Consulting Physician (Ophthalmology)  Indicate any recent Medical Services you may have received from other than [facility] providers in the past year (date may be approximate).  Assessment:   This is a routine wellness examination for [place].  Hearing/Vision screen No results found.  Dietary issues and exercise activities discussed:     Goals Addressed   None   Depression Screen    12/04/2021    9:28 AM 04/26/2021    9:05 AM 01/05/2020    9:02 AM 06/23/2018   11:16 AM 06/23/2018    9:04 AM 06/19/2017    8:47 AM 06/17/2016    8:53 AM  PHQ 2/9 Scores  PHQ - 2 Score 0 0 0 0 0 0 0  PHQ- 9 Score 2   0       Fall Risk    12/04/2021    9:29 AM 04/26/2021    9:10 AM 05/29/2020    8:38 AM 01/05/2020    9:02 AM 01/05/2020    8:28 AM  Fall Risk   Falls in the past year? 1 0 0 0 0  Number falls in past yr: 0 0 0 0 0  Injury with Fall? 0 0 0 0 0  Risk for fall due to : No Fall Risks No Fall Risks  No Fall Risks No Fall Risks  Follow up Falls evaluation completed Falls evaluation completed  Falls evaluation completed     FALL RISK PREVENTION PERTAINING TO THE HOME:  Any stairs in or around the home? {YES/NO:21197} If so, are there any without handrails? {YES/NO:21197} Home free of loose throw rugs in walkways, pet beds, electrical cords, etc? {YES/NO:21197} Adequate lighting in your home to reduce risk of falls? {YES/NO:21197}  ASSISTIVE DEVICES UTILIZED TO PREVENT FALLS:  Life alert? {YES/NO:21197} Use of a cane, walker or w/c? {YES/NO:21197} Grab bars in the bathroom?  {YES/NO:21197} Shower chair or bench in shower? {YES/NO:21197} Elevated toilet seat or a handicapped toilet? {YES/NO:21197}  Cognitive Function:    06/19/2017   11:21 AM  MMSE - Mini Mental State Exam  Orientation to time 5  Orientation to Place 5  Registration 3  Attention/ Calculation 4  Recall 2  Language- name 2 objects 2  Language- repeat 1  Language- follow 3 step command 3  Language- read & follow direction 1  Write a sentence 1  Copy design 1  Total score 28        01/05/2020    9:04 AM  6CIT Screen  What Year? 0 points  What month? 0 points  What time? 0 points  Count back from 20 0 points  Months in reverse 0 points  Repeat phrase 2 points  Total Score 2 points    Immunizations Immunization History  Administered Date(s) Administered   Fluad Quad(high Dose 65+) 06/05/2021   Influenza Split 05/21/2012   Influenza Whole 07/07/2009   Influenza, High Dose Seasonal PF 05/18/2013, 06/12/2015, 06/17/2016, 06/19/2017, 06/23/2018, 05/10/2019   Influenza,inj,Quad PF,6+ Mos 05/19/2014   Influenza-Unspecified 05/10/2020   PFIZER(Purple Top)SARS-COV-2 Vaccination 09/14/2019, 09/25/2019   Pneumococcal Conjugate-13 05/19/2014   Pneumococcal Polysaccharide-23 07/18/2016   Td 07/07/2009   Zoster, Live 03/07/2011    TDAP status: Due, Education has been provided regarding the importance of this vaccine. Advised may receive this vaccine at local pharmacy or Health Dept. Aware to provide a copy of the vaccination record if obtained from local pharmacy or Health Dept. Verbalized acceptance and understanding.  Flu Vaccine status: Due, Education has been provided regarding the importance of this vaccine. Advised may receive this vaccine at local pharmacy or Health Dept. Aware to provide a copy of the vaccination record if obtained from local  pharmacy or Health Dept. Verbalized acceptance and understanding.  Pneumococcal vaccine status: Up to date  Covid-19 vaccine status:  Information provided on how to obtain vaccines.   Qualifies for Shingles Vaccine? Yes   Zostavax completed No   Shingrix Completed?: No.    Education has been provided regarding the importance of this vaccine. Patient has been advised to call insurance company to determine out of pocket expense if they have not yet received this vaccine. Advised may also receive vaccine at local pharmacy or Health Dept. Verbalized acceptance and understanding.  Screening Tests Health Maintenance  Topic Date Due   Zoster Vaccines- Shingrix (1 of 2) Never done   TETANUS/TDAP  07/08/2019   COVID-19 Vaccine (3 - Pfizer series) 11/20/2019   INFLUENZA VACCINE  02/19/2022   Medicare Annual Wellness (AWV)  06/05/2023   Pneumonia Vaccine 8265+ Years old  Completed   HPV VACCINES  Aged Out    Health Maintenance  Health Maintenance Due  Topic Date Due   Zoster Vaccines- Shingrix (1 of 2) Never done   TETANUS/TDAP  07/08/2019   COVID-19 Vaccine (3 - Pfizer series) 11/20/2019   INFLUENZA VACCINE  02/19/2022    Colorectal cancer screening: No longer required.   Lung Cancer Screening: (Low Dose CT Chest recommended if Age 71-80 years, 30 pack-year currently smoking OR have quit w/in 15years.) does not qualify.   Additional Screening:  Hepatitis C Screening: does qualify; Completed eduction provided  Vision Screening: Recommended annual ophthalmology exams for early detection of glaucoma and other disorders of the eye. Is the patient up to date with their annual eye exam?  Yes  Who is the provider or what is the name of the office in which the patient attends annual eye exams? Dr. Dione BoozeGroat If pt is not established with a provider, would they like to be referred to a provider to establish care?  N/A .   Dental Screening: Recommended annual dental exams for proper oral hygiene  Community Resource Referral / Chronic Care Management: CRR required this visit?  {YES/NO:21197}  CCM required this visit?   {YES/NO:21197}     Plan:     I have personally reviewed and noted the following in the patient's chart:   Medical and social history Use of alcohol, tobacco or illicit drugs  Current medications and supplements including opioid prescriptions. Patient is not currently taking opioid prescriptions. Functional ability and status Nutritional status Physical activity Advanced directives List of other physicians Hospitalizations, surgeries, and ER visits in previous 12 months Vitals Screenings to include cognitive, depression, and falls Referrals and appointments  In addition, I have reviewed and discussed with patient certain preventive protocols, quality metrics, and best practice recommendations. A written personalized care plan for preventive services as well as general preventive health recommendations were provided to patient.     Wanda PlumpJill A Antonio Woodhams, RN   06/03/2022   Nurse Notes:  Preston Boyd , Thank you for taking time to come for your Medicare Wellness Visit. I appreciate your ongoing commitment to your health goals. Please review the following plan we discussed and let me know if I can assist you in the future.   These are the goals we discussed:  Goals      Patient Stated     Stay as healthy and as independent as possible. 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     Pharmacy Care Plan     CARE PLAN ENTRY (see longitudinal plan of care for additional care  plan information)  Current Barriers:  Chronic Disease Management support, education, and care coordination needs related to Hypertension, Hyperlipidemia, Coronary Artery Disease, and COPD   Hypertension BP Readings from Last 3 Encounters:  01/05/20 122/70  01/05/20 122/70  07/01/19 120/78  Pharmacist Clinical Goal(s): Over the next 180 days, patient will work with PharmD and providers to maintain BP goal <130/80 Current regimen:  Metoprolol tartrate 25 mg - 1/2 tablet twice  daily Interventions: Discussed BP goals and benefits of medications for prevention of heart attack / stroke Patient self care activities - Over the next 180 days, patient will: Check BP daily, document, and provide at future appointments Ensure daily salt intake < 2300 mg/day  Hyperlipidemia / CAD Lab Results  Component Value Date/Time   LDLCALC 46 07/02/2019 07:42 AM   LDLCALC 101 (H) 12/02/2014 08:06 AM  Pharmacist Clinical Goal(s): Over the next 180 days, patient will work with PharmD and providers to maintain LDL goal < 70 Current regimen:  Atorvastatin 40 mg daily Aspirin 325 mg daily Interventions: Discussed cholesterol goals and benefits of medications for prevention of heart attack / stroke Patient self care activities - Over the next 180 days, patient will: Continue current medications and lifestyle  COPD Pharmacist Clinical Goal(s) Over the next 30 days, patient will work with PharmD and providers to optimize therapy Current regimen:  Anoro Ellipta 1 puff daily Interventions: Discussed benefits of Anoro over Advair including once daily dosing  Contacted patient's pharmacy to ensure medication is covered - Tier 3 $47/month Patient self care activities - Over the next 30 days, patient will: Continue Anoro for at least a month, continue if symptoms improve over Advair  Medication management Pharmacist Clinical Goal(s): Over the next 180 days, patient will work with PharmD and providers to maintain optimal medication adherence [facility] Interventions Comprehensive medication review performed. Continue current medication management strategy Patient self care activities - Over the next 180 days, patient will: Focus on medication adherence by fill date Take medications as prescribed Report any questions or concerns to PharmD and/or provider(s)  Please see past updates related to this goal by clicking on the "Past Updates" button in the  selected goal         This is a list of the screening recommended for you and due dates:  Health Maintenance  Topic Date Due   Zoster (Shingles) Vaccine (1 of 2) Never done   Tetanus Vaccine  07/08/2019   COVID-19 Vaccine (3 - Pfizer series) 11/20/2019   Flu Shot  02/19/2022   Medicare Annual Wellness Visit  06/05/2023   Pneumonia Vaccine  Completed   HPV Vaccine  Aged Out

## 2022-06-04 ENCOUNTER — Ambulatory Visit: Payer: Medicare HMO

## 2022-06-04 DIAGNOSIS — Z0000 Encounter for general adult medical examination without abnormal findings: Secondary | ICD-10-CM

## 2022-06-04 NOTE — Progress Notes (Signed)
Subjective:    Subjective  Preston Boyd is a 81 y.o. male who presents for Medicare Annual/Subsequent preventive examination.   Review of Systems    Refer to PCP.        Objective:    Objective  There were no vitals filed for this visit. There is no height or weight on file to calculate BMI.       06/04/2022    9:41 AM 04/26/2021    9:08 AM 01/05/2020    9:02 AM 06/23/2018   11:15 AM 06/19/2017   11:20 AM 06/21/2015    9:17 AM 06/18/2015    5:23 PM  Advanced Directives  Does Patient Have a Medical Advance Directive? No No No No No No No  Would patient like information on creating a medical advance directive? No - Patient declined No - Patient declined No - Patient declined No - Patient declined Yes (ED - Information included in AVS) No - patient declined information        Current Medications (verified)     Outpatient Encounter Medications as of 06/04/2022  Medication Sig   acetaminophen (TYLENOL) 500 MG tablet Take 500 mg by mouth every 6 (six) hours as needed for moderate pain.   aspirin EC 81 MG tablet Take 1 tablet (81 mg total) by mouth daily. Swallow whole.   atorvastatin (LIPITOR) 40 MG tablet Take 1 tablet (40 mg total) by mouth daily.   fluticasone-salmeterol (ADVAIR DISKUS) 250-50 MCG/ACT AEPB INHALE ONE PUFF BY MOUTH INTO THE LUNGS TWICE A DAY AS NEEDED FOR SHORTNESS OF BREATH   metoprolol tartrate (LOPRESSOR) 25 MG tablet TAKE 1/2 TABLET BY MOUTH TWO TIMES A DAY.   tamsulosin (FLOMAX) 0.4 MG CAPS capsule Take 1 capsule (0.4 mg total) by mouth daily.    No facility-administered encounter medications on file as of 06/04/2022.      Allergies (verified) Amoxicillin and Other    History:     Past Medical History:  Diagnosis Date   Acute cholecystitis s/p lap cholecystectomy 06/14/2015 06/13/2015   BENIGN PROSTATIC HYPERTROPHY     CAD (coronary artery disease) 01/2015    mild on cath 01/2015, med mgmt rec   CKD (chronic kidney disease) stage 3, GFR  30-59 ml/min (HCC)     COPD (chronic obstructive pulmonary disease) ([place])     DEGENERATIVE JOINT DISEASE, RIGHT HIP      s/p THR 11/2008   GERD     History of cardiovascular stress test 01/2015    Lexiscan Myoview 7/16:  Inferoseptal, inferior and apical ischemia, EF 67%, TID 1.09; intermediate risk   Hypertension     MIGRAINE HEADACHE     Osteoarthritis of knee      s/p B TKR 07/2009   RENAL INSUFFICIENCY           Past Surgical History:  Procedure Laterality Date   CARDIAC CATHETERIZATION N/A 02/15/2015    Procedure: Left Heart Cath and Coronary Angiography;  Surgeon: Troy Sine, MD;  Location: [facility];  Service: Cardiovascular;  Laterality: N/A;   CATARACT EXTRACTION, BILATERAL   10/2012   CHOLECYSTECTOMY N/A 06/14/2015    Procedure: LAPAROSCOPIC CHOLECYSTECTOMY WITH INTRAOPERATIVE CHOLANGIOGRAM;  Surgeon: Michael Boston, MD;  Location: [facility];  Service: General;  Laterality: N/A;   INGUINAL HERNIA REPAIR Bilateral 1960's   REPLACEMENT TOTAL KNEE BILATERAL   07/2009    graves   TONSILLECTOMY       TOTAL HIP ARTHROPLASTY   11/2008  Dr. Luiz Blare         Family History  Problem Relation Age of Onset   Colon cancer Mother     Lung cancer Father     Stroke Paternal Grandfather     Alcohol abuse Other     Arthritis Other     Diabetes Other     Hypertension Other     Heart disease Other     Heart attack Maternal Uncle      Social History         Socioeconomic History   Marital status: Single      Spouse name: Not on file   Number of children: 0   Years of education: 16   Highest education level: Not on file  Occupational History   Occupation: Retired  Tobacco Use   Smoking status: Former      Types: Cigarettes      Quit date: 07/22/1962      Years since quitting: 59.9   Smokeless tobacco: Never  Vaping Use   Vaping Use: Never used  Substance and Sexual Activity   Alcohol use: Yes      Alcohol/week: 0.0 standard drinks of alcohol      Comment:  ocassionally   Drug use: No   Sexual activity: Never  Other Topics Concern   Not on file  Social History Narrative    Single, lives alone. Retired from 3rd shift [employer]. 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    Social Determinants of Health        Financial Resource Strain: Low Risk  (04/26/2021)    Overall Financial Resource Strain (CARDIA)     Difficulty of Paying Living Expenses: Not hard at all  Food Insecurity: No Food Insecurity (04/26/2021)    Hunger Vital Sign     Worried About Running Out of Food in the Last Year: Never true     Ran Out of Food in the Last Year: Never true  Transportation Needs: No Transportation Needs (04/26/2021)    PRAPARE - Therapist, art (Medical): No     Lack of Transportation (Non-Medical): No  Physical Activity: Sufficiently Active (04/26/2021)    Exercise Vital Sign     Days of Exercise per Week: 7 days     Minutes of Exercise per Session: 30 min  Stress: No Stress Concern Present (04/26/2021)    [employer] of Occupational Health - Occupational Stress Questionnaire     Feeling of Stress : Not at all  Social Connections: Socially Isolated (04/26/2021)    Social Connection and Isolation Panel [NHANES]     Frequency of Communication with Friends and Family: More than three times a week     Frequency of Social Gatherings with Friends and Family: Once a week     Attends Religious Services: Never     Database administrator or Organizations: No     Attends Banker Meetings: Never     Marital Status: Never married      Tobacco Counseling Patient stopped smoking in 1964.    Clinical Intake:     Pre-visit preparation completed: YesPain : No/denies pain     Nutritional Status: BMI > 30  Obese Nutritional Risks: None Diabetes: No   How often do you need to have someone help you when you read instructions, pamphlets, or other written materials from your doctor or pharmacy?: 1 -  Never   Diabetic: No   Interpreter Needed?:  No       Activities of Daily Living     06/04/2022    9:38 AM  In your present state of health, do you have any difficulty performing the following activities:  Hearing? 1  Vision? 1  Difficulty concentrating or making decisions? 0  Walking or climbing stairs? 1  Dressing or bathing? 0  Doing errands, shopping? 0      Patient Care Team: Binnie Rail, MD as PCP - General (Internal Medicine) Dorna Leitz, MD as Consulting Physician (Orthopedic Surgery) [place], [place] Eduardo Osier., MD as Consulting Physician (Urology) Troy Sine, MD (Cardiology) Wilford Corner, MD as Consulting Physician (Gastroenterology) Charlton Haws, [facility] as Pharmacist (Pharmacist) [facility] as Consulting Physician (Ophthalmology)   Indicate any recent Medical Services you may have received from other than [facility] providers in the past year (date may be approximate).       Assessment:    Assessment This is a routine wellness examination for Preston Boyd.   Hearing/Vision screen No results found. Pt is hard of hearing and will get hearing aids in the next few weeks.    Dietary issues and exercise activities discussed: Current Exercise Habits: Structured exercise class, Type of exercise: strength training/weights;walking, Time (Minutes): 30, Frequency (Times/Week): 7, Weekly Exercise (Minutes/Week): 210, Intensity: Moderate, Exercise limited by: None identified     Goals Addressed   None      Depression Screen     06/04/2022    9:38 AM 12/04/2021    9:28 AM 04/26/2021    9:05 AM 01/05/2020    9:02 AM 06/23/2018   11:16 AM 06/23/2018    9:04 AM 06/19/2017    8:47 AM  PHQ 2/9 Scores  PHQ - 2 Score 0 0 0 0 0 0 0  PHQ- 9 Score 0 2     0        Fall Risk     06/04/2022    9:37 AM 12/04/2021    9:29 AM 04/26/2021    9:10 AM 05/29/2020    8:38 AM 01/05/2020    9:02 AM  Fall Risk   Falls in the past year? 1 1 0 0 0  Number  falls in past yr: 0 0 0 0 0  Injury with Fall? 0 0 0 0 0  Risk for fall due to : History of fall(s);Impaired mobility No Fall Risks No Fall Risks   No Fall Risks  Follow up Falls evaluation completed;Education provided;Falls prevention discussed Falls evaluation completed Falls evaluation completed   Falls evaluation completed      FALL RISK PREVENTION PERTAINING TO THE HOME:   Any stairs in or around the home? Yes  If so, are there any without handrails? Yes  Home free of loose throw rugs in walkways, pet beds, electrical cords, etc? No  Adequate lighting in your home to reduce risk of falls? Yes    ASSISTIVE DEVICES UTILIZED TO PREVENT FALLS:   Life alert? No  Use of a cane, walker or w/c? No  Grab bars in the bathroom? No  Shower chair or bench in shower? No  Elevated toilet seat or a handicapped toilet? Yes    TIMED UP AND GO:   Was the test performed? No .  Length of time to ambulate 10 feet: NA sec.    Gait unsteady without use of assistive device, provider informed and interventions were implemented   Cognitive Function:     06/19/2017   11:21 AM  MMSE -  Mini Mental State Exam  Orientation to time 5  Orientation to Place 5  Registration 3  Attention/ Calculation 4  Recall 2  Language- name 2 objects 2  Language- repeat 1  Language- follow 3 step command 3  Language- read & follow direction 1  Write a sentence 1  Copy design 1  Total score 28        01/05/2020    9:04 AM  6CIT Screen  What Year? 0 points  What month? 0 points  What time? 0 points  Count back from 20 0 points  Months in reverse 0 points  Repeat phrase 2 points  Total Score 2 points      Immunizations     Immunization History  Administered Date(s) Administered   Fluad Quad(high Dose 65+) 06/05/2021   Influenza Split 05/21/2012   Influenza Whole 07/07/2009   Influenza, High Dose Seasonal PF 05/18/2013, 06/12/2015, 06/17/2016, 06/19/2017, 06/23/2018, 05/10/2019   Influenza,inj,Quad  PF,6+ Mos 05/19/2014   Influenza-Unspecified 05/10/2020   PFIZER(Purple Top)SARS-COV-2 Vaccination 09/14/2019, 09/25/2019   Pneumococcal Conjugate-13 05/19/2014   Pneumococcal Polysaccharide-23 07/18/2016   Td 07/07/2009   Zoster, Live 03/07/2011      TDAP status: Due, Education has been provided regarding the importance of this vaccine. Advised may receive this vaccine at local pharmacy or Health Dept. Aware to provide a copy of the vaccination record if obtained from local pharmacy or Health Dept. Verbalized acceptance and understanding.   Flu Vaccine status: Due, Education has been provided regarding the importance of this vaccine. Advised may receive this vaccine at local pharmacy or Health Dept. Aware to provide a copy of the vaccination record if obtained from local pharmacy or Health Dept. Verbalized acceptance and understanding.   Pneumonia vaccine: Completed    Covid-19 vaccine status: Completed vaccines   Qualifies for Shingles Vaccine? Yes   Zostavax completed No   Shingrix Completed?: No.    Education has been provided regarding the importance of this vaccine. Patient has been advised to call insurance company to determine out of pocket expense if they have not yet received this vaccine. Advised may also receive vaccine at local pharmacy or Health Dept. Verbalized acceptance and understanding.   Screening Tests     Health Maintenance  Topic Date Due   Zoster Vaccines- Shingrix (1 of 2) 09/04/2022 (Originally 02/04/1992)   INFLUENZA VACCINE  10/20/2022 (Originally 02/19/2022)   TETANUS/TDAP  06/05/2023 (Originally 07/08/2019)   COVID-19 Vaccine (3 - Pfizer series) 06/05/2023 (Originally 11/20/2019)   Medicare Annual Wellness (AWV)  06/05/2023   Pneumonia Vaccine 5965+ Years old  Completed   HPV VACCINES  Aged Out      Health Maintenance   There are no preventive care reminders to display for this patient.   Colorectal cancer screening: Type of screening: Colonoscopy.  Completed No. Repeat every Unknown years   Lung Cancer Screening: (Low Dose CT Chest recommended if Age 25-80 years, 30 pack-year currently smoking OR have quit w/in 15years.) does qualify.    Lung Cancer Screening Referral: No   Additional Screening:   Hepatitis C Screening: does not qualify; Completed yes   Vision Screening: Recommended annual ophthalmology exams for early detection of glaucoma and other disorders of the eye. Is the patient up to date with their annual eye exam?  Yes  Who is the provider or what is the name of the office in which the patient attends annual eye exams? Dr. Lavona [place]Grout If pt is not established with a provider, would they like  to be referred to a provider to establish care? No .    Dental Screening: Recommended annual dental exams for proper oral hygiene   Community Resource Referral / Chronic Care Management: CRR required this visit?  No    CCM required this visit?  No          Plan:    Plan I have personally reviewed and noted the following in the patient's chart:    Medical and social history Use of alcohol, tobacco or illicit drugs  Current medications and supplements including opioid prescriptions. Patient is not currently taking opioid prescriptions. Functional ability and status Nutritional status Physical activity Advanced directives List of other physicians Hospitalizations, surgeries, and ER visits in previous 12 months Vitals Screenings to include cognitive, depression, and falls Referrals and appointments   In addition, I have reviewed and discussed with patient certain preventive protocols, quality metrics, and best practice recommendations. A written personalized care plan for preventive services as well as general preventive health recommendations were provided to patient.         Henrene Dodge, RN                          06/04/2022    AWV was conducted by: Elicia Lamp, RN Clinical Supervisor 06/04/2022 Nurse Notes:  Face to Face 40 minutes

## 2022-06-05 ENCOUNTER — Encounter: Payer: Self-pay | Admitting: Internal Medicine

## 2022-06-05 NOTE — Patient Instructions (Addendum)
   You can get tetanus and shingles vaccine at the pharmacy.   Flu immunization administered today.     Blood work was ordered.   The lab is on the first floor.    Medications changes include :   none     Return in about 6 months (around 12/05/2022) for follow up.

## 2022-06-05 NOTE — Progress Notes (Unsigned)
Subjective:    Patient ID: Preston Boyd, male    DOB: 17-Feb-1941, 81 y.o.   MRN: [id number]     HPI Leonard is here for follow up of his chronic medical problems, including CAD, htn, hld, prediabetes, CKD, COPD, BPH  Eating well.    Goes to gym 3/week.  Walks the dog.    Medications and allergies reviewed with patient and updated if appropriate.  Current Outpatient Medications on File Prior to Visit  Medication Sig Dispense Refill   acetaminophen (TYLENOL) 500 MG tablet Take 500 mg by mouth every 6 (six) hours as needed for moderate pain.     aspirin EC 81 MG tablet Take 1 tablet (81 mg total) by mouth daily. Swallow whole. 30 tablet 11   atorvastatin (LIPITOR) 40 MG tablet Take 1 tablet (40 mg total) by mouth daily. 90 tablet 2   fluticasone-salmeterol (ADVAIR DISKUS) 250-50 MCG/ACT AEPB INHALE ONE PUFF BY MOUTH INTO THE LUNGS TWICE A DAY AS NEEDED FOR SHORTNESS OF BREATH 60 each 5   metoprolol tartrate (LOPRESSOR) 25 MG tablet TAKE 1/2 TABLET BY MOUTH TWO TIMES A DAY. 90 tablet 3   tamsulosin (FLOMAX) 0.4 MG CAPS capsule Take 1 capsule (0.4 mg total) by mouth daily. 90 capsule 3   No current facility-administered medications on file prior to visit.     Review of Systems  Constitutional:  Negative for fever.  Respiratory:  Positive for cough (occ to clear throat - has phlegm). Negative for shortness of breath and wheezing.   Cardiovascular:  Negative for chest pain, palpitations and leg swelling.  Gastrointestinal:        Denies GERD  Genitourinary:  Positive for difficulty urinating (slow) and frequency. Negative for dysuria.  Neurological:  Negative for light-headedness and headaches.       Objective:   Vitals:   06/06/22 0804  BP: 106/68  Pulse: 60  Temp: 97.9 F (36.6 C)  SpO2: 98%   BP Readings from Last 3 Encounters:  06/06/22 106/68  12/04/21 114/76  06/05/21 108/80   Wt Readings from Last 3 Encounters:  06/06/22 248 lb (112.5 kg)  12/04/21  241 lb (109.3 kg)  06/05/21 245 lb (111.1 kg)   Body mass index is 32.72 kg/m.    Physical Exam Constitutional:      General: He is not in acute distress.    Appearance: Normal appearance. He is not ill-appearing.  HENT:     Head: Normocephalic and atraumatic.  Eyes:     Conjunctiva/sclera: Conjunctivae normal.  Cardiovascular:     Rate and Rhythm: Normal rate and regular rhythm.     Heart sounds: Normal heart sounds. No murmur heard. Pulmonary:     Effort: Pulmonary effort is normal. No respiratory distress.     Breath sounds: Normal breath sounds. No wheezing or rales.  Musculoskeletal:     Right lower leg: No edema.     Left lower leg: No edema.  Skin:    General: Skin is warm and dry.     Findings: No rash.  Neurological:     Mental Status: He is alert. Mental status is at baseline.  Psychiatric:        Mood and Affect: Mood normal.        Lab Results  Component Value Date   WBC 10.4 06/05/2021   HGB 16.3 06/05/2021   HCT 49.2 06/05/2021   PLT 204.0 06/05/2021   GLUCOSE 105 (H) 12/04/2021   CHOL 107  12/04/2021   TRIG 78.0 12/04/2021   HDL 44.10 12/04/2021   LDLCALC 48 12/04/2021   ALT 20 12/04/2021   AST 19 12/04/2021   NA 140 12/04/2021   K 4.3 12/04/2021   CL 104 12/04/2021   CREATININE 1.60 (H) 12/04/2021   BUN 22 12/04/2021   CO2 27 12/04/2021   TSH 1.89 11/29/2020   PSA 2.31 03/07/2011   INR 1.24 06/14/2015   HGBA1C 5.9 12/04/2021     Assessment & Plan:    See Problem List for Assessment and Plan of chronic medical problems.

## 2022-06-06 ENCOUNTER — Ambulatory Visit (INDEPENDENT_AMBULATORY_CARE_PROVIDER_SITE_OTHER): Payer: Medicare HMO | Admitting: Internal Medicine

## 2022-06-06 VITALS — BP 106/68 | HR 60 | Temp 97.9°F | Ht 73.0 in | Wt 248.0 lb

## 2022-06-06 DIAGNOSIS — I10 Essential (primary) hypertension: Secondary | ICD-10-CM | POA: Diagnosis not present

## 2022-06-06 DIAGNOSIS — E7849 Other hyperlipidemia: Secondary | ICD-10-CM | POA: Diagnosis not present

## 2022-06-06 DIAGNOSIS — N400 Enlarged prostate without lower urinary tract symptoms: Secondary | ICD-10-CM

## 2022-06-06 DIAGNOSIS — I2510 Atherosclerotic heart disease of native coronary artery without angina pectoris: Secondary | ICD-10-CM

## 2022-06-06 DIAGNOSIS — Z23 Encounter for immunization: Secondary | ICD-10-CM

## 2022-06-06 DIAGNOSIS — J449 Chronic obstructive pulmonary disease, unspecified: Secondary | ICD-10-CM

## 2022-06-06 DIAGNOSIS — R7303 Prediabetes: Secondary | ICD-10-CM

## 2022-06-06 DIAGNOSIS — N1832 Chronic kidney disease, stage 3b: Secondary | ICD-10-CM | POA: Diagnosis not present

## 2022-06-06 LAB — COMPREHENSIVE METABOLIC PANEL
ALT: 18 U/L (ref 0–53)
AST: 19 U/L (ref 0–37)
Albumin: 4 g/dL (ref 3.5–5.2)
Alkaline Phosphatase: 59 U/L (ref 39–117)
BUN: 20 mg/dL (ref 6–23)
CO2: 30 mEq/L (ref 19–32)
Calcium: 9.5 mg/dL (ref 8.4–10.5)
Chloride: 104 mEq/L (ref 96–112)
Creatinine, Ser: 1.63 mg/dL — ABNORMAL HIGH (ref 0.40–1.50)
GFR: 39.6 mL/min — ABNORMAL LOW (ref >60.00)
Glucose, Bld: 126 mg/dL — ABNORMAL HIGH (ref 70–99)
Potassium: 4.5 mEq/L (ref 3.5–5.1)
Sodium: 140 mEq/L (ref 135–145)
Total Bilirubin: 0.8 mg/dL (ref 0.2–1.2)
Total Protein: 6.7 g/dL (ref 6.0–8.3)

## 2022-06-06 LAB — LIPID PANEL
Cholesterol: 107 mg/dL (ref 0–200)
HDL: 43.3 mg/dL (ref >39.00)
LDL Cholesterol: 46 mg/dL (ref 0–99)
NonHDL: 63.27
Total CHOL/HDL Ratio: 2
Triglycerides: 88 mg/dL (ref 0.0–149.0)
VLDL: 17.6 mg/dL (ref 0.0–40.0)

## 2022-06-06 LAB — CBC WITH DIFFERENTIAL/PLATELET
Basophils Absolute: 0.1 10*3/uL (ref 0.0–0.1)
Basophils Relative: 1.2 % (ref 0.0–3.0)
Eosinophils Absolute: 0.2 10*3/uL (ref 0.0–0.7)
Eosinophils Relative: 3.2 % (ref 0.0–5.0)
HCT: 49.7 % (ref 39.0–52.0)
Hemoglobin: 16.5 g/dL (ref 13.0–17.0)
Lymphocytes Relative: 31.3 % (ref 12.0–46.0)
Lymphs Abs: 2.1 10*3/uL (ref 0.7–4.0)
MCHC: 33.2 g/dL (ref 30.0–36.0)
MCV: 92.4 fl (ref 78.0–100.0)
Monocytes Absolute: 0.5 10*3/uL (ref 0.1–1.0)
Monocytes Relative: 7.4 % (ref 3.0–12.0)
Neutro Abs: 3.8 10*3/uL (ref 1.4–7.7)
Neutrophils Relative %: 56.9 % (ref 43.0–77.0)
Platelets: 175 10*3/uL (ref 150.0–400.0)
RBC: 5.38 Mil/uL (ref 4.22–5.81)
RDW: 14.8 % (ref 11.5–15.5)
WBC: 6.7 10*3/uL (ref 4.0–10.5)

## 2022-06-06 LAB — HEMOGLOBIN A1C: Hgb A1c MFr Bld: 6.1 % (ref 4.6–6.5)

## 2022-06-06 NOTE — Assessment & Plan Note (Addendum)
Chronic Controlled, stable Slow stream Continue flomax 0.4 mg daily

## 2022-06-06 NOTE — Assessment & Plan Note (Signed)
Chronic Check lipid panel  Continue atorvastatin 40 mg daily continue exercise and healthy diet encouraged

## 2022-06-06 NOTE — Assessment & Plan Note (Signed)
Chronic Has occ cough to clear phlegm Not much wheeze, sob Continue Advair 250-50 1-2 times a day

## 2022-06-06 NOTE — Assessment & Plan Note (Signed)
Chronic Check a1c Low sugar / carb diet Stressed regular exercise  

## 2022-06-06 NOTE — Assessment & Plan Note (Signed)
Chronic Asymptomatic  Continue atorvastatin 40 mg daily, asa 81 mg daily, metoprolol 12.5 mg twice daily

## 2022-06-06 NOTE — Assessment & Plan Note (Signed)
Chronic Stable cmp 

## 2022-06-06 NOTE — Addendum Note (Signed)
Addended by: Karma Ganja on: 06/06/2022 09:40 AM   Modules accepted: Orders

## 2022-08-31 ENCOUNTER — Other Ambulatory Visit: Payer: Self-pay | Admitting: Internal Medicine

## 2022-12-04 ENCOUNTER — Encounter: Payer: Self-pay | Admitting: Internal Medicine

## 2022-12-04 NOTE — Progress Notes (Unsigned)
Subjective:    Patient ID: Preston Boyd, male    DOB: 04/25/1941, 82 y.o.   MRN: [id number]     HPI Jaterrion is here for follow up of his chronic medical problems.  Doing well.  Right hand wants to draw up- very painful.    2 new moles on chin.   Exercising 3 / weeks.    Medications and allergies reviewed with patient and updated if appropriate.  Current Outpatient Medications on File Prior to Visit  Medication Sig Dispense Refill   acetaminophen (TYLENOL) 500 MG tablet Take 500 mg by mouth every 6 (six) hours as needed for moderate pain.     aspirin EC 81 MG tablet Take 1 tablet (81 mg total) by mouth daily. Swallow whole. 30 tablet 11   atorvastatin (LIPITOR) 40 MG tablet TAKE ONE TABLET BY MOUTH DAILY 90 tablet 2   fluticasone-salmeterol (ADVAIR DISKUS) 250-50 MCG/ACT AEPB INHALE ONE PUFF BY MOUTH INTO THE LUNGS TWICE A DAY AS NEEDED FOR SHORTNESS OF BREATH 60 each 5   metoprolol tartrate (LOPRESSOR) 25 MG tablet TAKE 1/2 TABLET BY MOUTH TWO TIMES A DAY. 90 tablet 3   tamsulosin (FLOMAX) 0.4 MG CAPS capsule Take 1 capsule (0.4 mg total) by mouth daily. 90 capsule 3   No current facility-administered medications on file prior to visit.     Review of Systems  Constitutional:  Negative for fever.  Respiratory:  Positive for cough (to clear mucus), shortness of breath and wheezing (occ).   Cardiovascular:  Negative for chest pain, palpitations and leg swelling.  Genitourinary:  Positive for difficulty urinating (slow at times).  Musculoskeletal:  Positive for arthralgias.  Neurological:  Negative for dizziness, light-headedness and headaches.       Objective:   Vitals:   12/05/22 0830  BP: 112/72  Pulse: (!) 59  Temp: 98 F (36.7 C)  SpO2: 96%   BP Readings from Last 3 Encounters:  12/05/22 112/72  06/06/22 106/68  12/04/21 114/76   Wt Readings from Last 3 Encounters:  12/05/22 251 lb (113.9 kg)  06/06/22 248 lb (112.5 kg)  12/04/21 241 lb (109.3  kg)   Body mass index is 33.12 kg/m.    Physical Exam Constitutional:      General: He is not in acute distress.    Appearance: Normal appearance. He is not ill-appearing.  HENT:     Head: Normocephalic and atraumatic.  Eyes:     Conjunctiva/sclera: Conjunctivae normal.  Cardiovascular:     Rate and Rhythm: Normal rate and regular rhythm.     Heart sounds: Normal heart sounds.  Pulmonary:     Effort: Pulmonary effort is normal. No respiratory distress.     Breath sounds: Normal breath sounds. No wheezing or rales.  Musculoskeletal:     Right lower leg: Edema (trace) present.     Left lower leg: Edema (mild) present.  Skin:    General: Skin is warm and dry.     Findings: No rash.     Comments: 2 moles on chin - new per patient  Neurological:     Mental Status: He is alert. Mental status is at baseline.  Psychiatric:        Mood and Affect: Mood normal.        Lab Results  Component Value Date   WBC 6.7 06/06/2022   HGB 16.5 06/06/2022   HCT 49.7 06/06/2022   PLT 175.0 06/06/2022   GLUCOSE 126 (H) 06/06/2022  CHOL 107 06/06/2022   TRIG 88.0 06/06/2022   HDL 43.30 06/06/2022   LDLCALC 46 06/06/2022   ALT 18 06/06/2022   AST 19 06/06/2022   NA 140 06/06/2022   K 4.5 06/06/2022   CL 104 06/06/2022   CREATININE 1.63 (H) 06/06/2022   BUN 20 06/06/2022   CO2 30 06/06/2022   TSH 1.89 11/29/2020   PSA 2.31 03/07/2011   INR 1.24 06/14/2015   HGBA1C 6.1 06/06/2022     Assessment & Plan:    See Problem List for Assessment and Plan of chronic medical problems.

## 2022-12-04 NOTE — Patient Instructions (Addendum)
      Blood work was ordered.   The lab is on the first floor.    Medications changes include :   None    A referral was ordered for [facility] for your hands.     Monitor your moles - if they change we should have you see a dermatologist.    Return in about 6 months (around 06/07/2023) for follow up.

## 2022-12-05 ENCOUNTER — Ambulatory Visit (INDEPENDENT_AMBULATORY_CARE_PROVIDER_SITE_OTHER): Payer: Medicare HMO | Admitting: Internal Medicine

## 2022-12-05 VITALS — BP 112/72 | HR 59 | Temp 98.0°F | Ht 73.0 in | Wt 251.0 lb

## 2022-12-05 DIAGNOSIS — R7303 Prediabetes: Secondary | ICD-10-CM

## 2022-12-05 DIAGNOSIS — E7849 Other hyperlipidemia: Secondary | ICD-10-CM | POA: Diagnosis not present

## 2022-12-05 DIAGNOSIS — N400 Enlarged prostate without lower urinary tract symptoms: Secondary | ICD-10-CM | POA: Diagnosis not present

## 2022-12-05 DIAGNOSIS — M79642 Pain in left hand: Secondary | ICD-10-CM

## 2022-12-05 DIAGNOSIS — D229 Melanocytic nevi, unspecified: Secondary | ICD-10-CM | POA: Insufficient documentation

## 2022-12-05 DIAGNOSIS — M79641 Pain in right hand: Secondary | ICD-10-CM | POA: Diagnosis not present

## 2022-12-05 DIAGNOSIS — J449 Chronic obstructive pulmonary disease, unspecified: Secondary | ICD-10-CM

## 2022-12-05 DIAGNOSIS — N1832 Chronic kidney disease, stage 3b: Secondary | ICD-10-CM | POA: Diagnosis not present

## 2022-12-05 DIAGNOSIS — I700 Atherosclerosis of aorta: Secondary | ICD-10-CM

## 2022-12-05 DIAGNOSIS — I2510 Atherosclerotic heart disease of native coronary artery without angina pectoris: Secondary | ICD-10-CM | POA: Diagnosis not present

## 2022-12-05 DIAGNOSIS — I10 Essential (primary) hypertension: Secondary | ICD-10-CM

## 2022-12-05 LAB — HEMOGLOBIN A1C: Hgb A1c MFr Bld: 6.2 % (ref 4.6–6.5)

## 2022-12-05 LAB — CBC WITH DIFFERENTIAL/PLATELET
Basophils Absolute: 0.1 10*3/uL (ref 0.0–0.1)
Basophils Relative: 1 % (ref 0.0–3.0)
Eosinophils Absolute: 0.2 10*3/uL (ref 0.0–0.7)
Eosinophils Relative: 2.8 % (ref 0.0–5.0)
HCT: 50 % (ref 39.0–52.0)
Hemoglobin: 16.7 g/dL (ref 13.0–17.0)
Lymphocytes Relative: 34.7 % (ref 12.0–46.0)
Lymphs Abs: 2.6 10*3/uL (ref 0.7–4.0)
MCHC: 33.4 g/dL (ref 30.0–36.0)
MCV: 92 fl (ref 78.0–100.0)
Monocytes Absolute: 0.5 10*3/uL (ref 0.1–1.0)
Monocytes Relative: 7.1 % (ref 3.0–12.0)
Neutro Abs: 4.2 10*3/uL (ref 1.4–7.7)
Neutrophils Relative %: 54.4 % (ref 43.0–77.0)
Platelets: 172 10*3/uL (ref 150.0–400.0)
RBC: 5.43 Mil/uL (ref 4.22–5.81)
RDW: 14.7 % (ref 11.5–15.5)
WBC: 7.6 10*3/uL (ref 4.0–10.5)

## 2022-12-05 LAB — COMPREHENSIVE METABOLIC PANEL
ALT: 16 U/L (ref 0–53)
AST: 15 U/L (ref 0–37)
Albumin: 4.2 g/dL (ref 3.5–5.2)
Alkaline Phosphatase: 59 U/L (ref 39–117)
BUN: 23 mg/dL (ref 6–23)
CO2: 27 mEq/L (ref 19–32)
Calcium: 9.8 mg/dL (ref 8.4–10.5)
Chloride: 104 mEq/L (ref 96–112)
Creatinine, Ser: 1.8 mg/dL — ABNORMAL HIGH (ref 0.40–1.50)
GFR: 35.03 mL/min — ABNORMAL LOW (ref >60.00)
Glucose, Bld: 119 mg/dL — ABNORMAL HIGH (ref 70–99)
Potassium: 4.3 mEq/L (ref 3.5–5.1)
Sodium: 140 mEq/L (ref 135–145)
Total Bilirubin: 1.1 mg/dL (ref 0.2–1.2)
Total Protein: 7.2 g/dL (ref 6.0–8.3)

## 2022-12-05 LAB — LIPID PANEL
Cholesterol: 103 mg/dL (ref 0–200)
HDL: 42.3 mg/dL (ref >39.00)
LDL Cholesterol: 40 mg/dL (ref 0–99)
NonHDL: 60.23
Total CHOL/HDL Ratio: 2
Triglycerides: 99 mg/dL (ref 0.0–149.0)
VLDL: 19.8 mg/dL (ref 0.0–40.0)

## 2022-12-05 MED ORDER — TAMSULOSIN HCL 0.4 MG PO CAPS: 0.4000 mg | ORAL_CAPSULE | Freq: Every day | ORAL | 3 refills | Status: DC

## 2022-12-05 MED ORDER — FLUTICASONE-SALMETEROL 250-50 MCG/ACT IN AEPB: INHALATION_SPRAY | RESPIRATORY_TRACT | 5 refills | Status: DC

## 2022-12-05 NOTE — Assessment & Plan Note (Signed)
Chronic Stable cmp, CBC

## 2022-12-05 NOTE — Assessment & Plan Note (Signed)
2 new moles on chin No obvious change since he noticed them Deferred derm referral He will monitor for now

## 2022-12-05 NOTE — Assessment & Plan Note (Signed)
Chronic Asymptomatic  Continue atorvastatin 40 mg daily, asa 81 mg daily, metoprolol 12.5 mg twice daily 

## 2022-12-05 NOTE — Assessment & Plan Note (Signed)
Chronic Check a1c Low sugar / carb diet Stressed regular exercise  

## 2022-12-05 NOTE — Assessment & Plan Note (Addendum)
Chronic cough to clear phlegm, chronic SOB, and occ wheeze Continue Advair 250-50 1-2 times a day

## 2022-12-05 NOTE — Assessment & Plan Note (Signed)
Chronic Controlled, stable Slow stream Continue flomax 0.4 mg daily  

## 2022-12-05 NOTE — Assessment & Plan Note (Signed)
New B/l hands contract at times and hurt mostly in 4-5th fingers. Referral to [facility]

## 2022-12-05 NOTE — Assessment & Plan Note (Signed)
Chronic Check lipid panel  Continue atorvastatin 40 mg daily Regular exercise and healthy diet encouraged  

## 2022-12-05 NOTE — Assessment & Plan Note (Signed)
Chronic Continue atorvastatin 40 mg daily Continue regular exercise

## 2022-12-05 NOTE — Assessment & Plan Note (Signed)
Chronic Blood pressure well controlled CMP Continue metoprolol 12.5 mg twice daily 

## 2023-01-30 ENCOUNTER — Other Ambulatory Visit: Payer: Self-pay | Admitting: Internal Medicine

## 2023-02-11 DIAGNOSIS — H43812 Vitreous degeneration, left eye: Secondary | ICD-10-CM | POA: Diagnosis not present

## 2023-02-11 DIAGNOSIS — H26492 Other secondary cataract, left eye: Secondary | ICD-10-CM | POA: Diagnosis not present

## 2023-02-11 DIAGNOSIS — H0288B Meibomian gland dysfunction left eye, upper and lower eyelids: Secondary | ICD-10-CM | POA: Diagnosis not present

## 2023-02-11 DIAGNOSIS — H0288A Meibomian gland dysfunction right eye, upper and lower eyelids: Secondary | ICD-10-CM | POA: Diagnosis not present

## 2023-02-11 DIAGNOSIS — Z961 Presence of intraocular lens: Secondary | ICD-10-CM | POA: Diagnosis not present

## 2023-02-11 DIAGNOSIS — H04123 Dry eye syndrome of bilateral lacrimal glands: Secondary | ICD-10-CM | POA: Diagnosis not present

## 2023-03-06 DIAGNOSIS — H903 Sensorineural hearing loss, bilateral: Secondary | ICD-10-CM | POA: Diagnosis not present

## 2023-04-23 DIAGNOSIS — M25511 Pain in right shoulder: Secondary | ICD-10-CM | POA: Diagnosis not present

## 2023-04-29 DIAGNOSIS — I129 Hypertensive chronic kidney disease with stage 1 through stage 4 chronic kidney disease, or unspecified chronic kidney disease: Secondary | ICD-10-CM | POA: Diagnosis not present

## 2023-04-29 DIAGNOSIS — D631 Anemia in chronic kidney disease: Secondary | ICD-10-CM | POA: Diagnosis not present

## 2023-04-29 DIAGNOSIS — N2581 Secondary hyperparathyroidism of renal origin: Secondary | ICD-10-CM | POA: Diagnosis not present

## 2023-04-29 DIAGNOSIS — N1832 Chronic kidney disease, stage 3b: Secondary | ICD-10-CM | POA: Diagnosis not present

## 2023-05-01 LAB — LAB REPORT - SCANNED
Creatinine, POC: 117.4 mg/dL
EGFR: 40

## 2023-05-31 ENCOUNTER — Other Ambulatory Visit: Payer: Self-pay | Admitting: Internal Medicine

## 2023-06-08 NOTE — Patient Instructions (Addendum)
     Flu immunization administered today.      Blood work was ordered.   The lab is on the first floor.    Medications changes include :   none      Return in about 6 months (around 12/08/2023) for follow up.

## 2023-06-08 NOTE — Progress Notes (Unsigned)
      Subjective:    Patient ID: Preston Boyd, male    DOB: 02/25/1941, 81 y.o.   MRN: [id number]     HPI Preston Boyd is here for follow up of his chronic medical problems.  Going to the gym.  Limits his sugars - does not watch his carbs.   Medications and allergies reviewed with patient and updated if appropriate.  Current Outpatient Medications on File Prior to Visit  Medication Sig Dispense Refill   acetaminophen (TYLENOL) 500 MG tablet Take 500 mg by mouth every 6 (six) hours as needed for moderate pain.     aspirin EC 81 MG tablet Take 1 tablet (81 mg total) by mouth daily. Swallow whole. 30 tablet 11   atorvastatin (LIPITOR) 40 MG tablet TAKE 1 TABLET BY MOUTH DAILY 90 tablet 2   fluticasone-salmeterol (ADVAIR DISKUS) 250-50 MCG/ACT AEPB INHALE ONE PUFF BY MOUTH INTO THE LUNGS TWICE A DAY AS NEEDED FOR SHORTNESS OF BREATH 60 each 5   metoprolol tartrate (LOPRESSOR) 25 MG tablet TAKE 1/2 TABLET BY MOUTH TWICE A DAY 90 tablet 3   tamsulosin (FLOMAX) 0.4 MG CAPS capsule Take 1 capsule (0.4 mg total) by mouth daily. 90 capsule 3   No current facility-administered medications on file prior to visit.     Review of Systems  Constitutional:  Negative for fever.  Respiratory:  Positive for cough (occ clearing cough) and shortness of breath (occ with strenous exercise). Negative for wheezing.   Cardiovascular:  Negative for chest pain, palpitations and leg swelling.  Gastrointestinal:  Negative for abdominal pain.       No gerd  Musculoskeletal:  Positive for arthralgias (mild OA).  Neurological:  Negative for light-headedness and headaches.       Objective:   Vitals:   06/10/23 0905  BP: 114/70  Pulse: (!) 54  Temp: 98 F (36.7 C)  SpO2: 95%   BP Readings from Last 3 Encounters:  06/10/23 114/70  12/05/22 112/72  06/06/22 106/68   Wt Readings from Last 3 Encounters:  06/10/23 250 lb (113.4 kg)  12/05/22 251 lb (113.9 kg)  06/06/22 248 lb (112.5 kg)   Body  mass index is 32.98 kg/m.    Physical Exam     Lab Results  Component Value Date   WBC 7.6 12/05/2022   HGB 16.7 12/05/2022   HCT 50.0 12/05/2022   PLT 172.0 12/05/2022   GLUCOSE 119 (H) 12/05/2022   CHOL 103 12/05/2022   TRIG 99.0 12/05/2022   HDL 42.30 12/05/2022   LDLCALC 40 12/05/2022   ALT 16 12/05/2022   AST 15 12/05/2022   NA 140 12/05/2022   K 4.3 12/05/2022   CL 104 12/05/2022   CREATININE 1.80 (H) 12/05/2022   BUN 23 12/05/2022   CO2 27 12/05/2022   TSH 1.89 11/29/2020   PSA 2.31 03/07/2011   INR 1.24 06/14/2015   HGBA1C 6.2 12/05/2022     Assessment & Plan:    See Problem List for Assessment and Plan of chronic medical problems.

## 2023-06-10 ENCOUNTER — Encounter: Payer: Self-pay | Admitting: Internal Medicine

## 2023-06-10 ENCOUNTER — Ambulatory Visit (INDEPENDENT_AMBULATORY_CARE_PROVIDER_SITE_OTHER): Payer: Medicare HMO | Admitting: Internal Medicine

## 2023-06-10 VITALS — BP 114/70 | HR 54 | Temp 98.0°F | Ht 73.0 in | Wt 250.0 lb

## 2023-06-10 DIAGNOSIS — J449 Chronic obstructive pulmonary disease, unspecified: Secondary | ICD-10-CM

## 2023-06-10 DIAGNOSIS — N400 Enlarged prostate without lower urinary tract symptoms: Secondary | ICD-10-CM | POA: Diagnosis not present

## 2023-06-10 DIAGNOSIS — R7303 Prediabetes: Secondary | ICD-10-CM | POA: Diagnosis not present

## 2023-06-10 DIAGNOSIS — E7849 Other hyperlipidemia: Secondary | ICD-10-CM

## 2023-06-10 DIAGNOSIS — Z23 Encounter for immunization: Secondary | ICD-10-CM

## 2023-06-10 DIAGNOSIS — I2510 Atherosclerotic heart disease of native coronary artery without angina pectoris: Secondary | ICD-10-CM | POA: Diagnosis not present

## 2023-06-10 DIAGNOSIS — I10 Essential (primary) hypertension: Secondary | ICD-10-CM

## 2023-06-10 DIAGNOSIS — N1832 Chronic kidney disease, stage 3b: Secondary | ICD-10-CM | POA: Diagnosis not present

## 2023-06-10 LAB — CBC WITH DIFFERENTIAL/PLATELET
Basophils Absolute: 0.1 10*3/uL (ref 0.0–0.1)
Basophils Relative: 1.2 % (ref 0.0–3.0)
Eosinophils Absolute: 0.3 10*3/uL (ref 0.0–0.7)
Eosinophils Relative: 3.5 % (ref 0.0–5.0)
HCT: 49.7 % (ref 39.0–52.0)
Hemoglobin: 16.4 g/dL (ref 13.0–17.0)
Lymphocytes Relative: 32.5 % (ref 12.0–46.0)
Lymphs Abs: 2.3 10*3/uL (ref 0.7–4.0)
MCHC: 33.1 g/dL (ref 30.0–36.0)
MCV: 93.2 fL (ref 78.0–100.0)
Monocytes Absolute: 0.5 10*3/uL (ref 0.1–1.0)
Monocytes Relative: 7.2 % (ref 3.0–12.0)
Neutro Abs: 4 10*3/uL (ref 1.4–7.7)
Neutrophils Relative %: 55.6 % (ref 43.0–77.0)
Platelets: 180 10*3/uL (ref 150.0–400.0)
RBC: 5.33 Mil/uL (ref 4.22–5.81)
RDW: 14.6 % (ref 11.5–15.5)
WBC: 7.2 10*3/uL (ref 4.0–10.5)

## 2023-06-10 LAB — COMPREHENSIVE METABOLIC PANEL
ALT: 18 U/L (ref 0–53)
AST: 18 U/L (ref 0–37)
Albumin: 4.2 g/dL (ref 3.5–5.2)
Alkaline Phosphatase: 58 U/L (ref 39–117)
BUN: 23 mg/dL (ref 6–23)
CO2: 28 meq/L (ref 19–32)
Calcium: 9.5 mg/dL (ref 8.4–10.5)
Chloride: 104 meq/L (ref 96–112)
Creatinine, Ser: 1.58 mg/dL — ABNORMAL HIGH (ref 0.40–1.50)
GFR: 40.82 mL/min — ABNORMAL LOW (ref >60.00)
Glucose, Bld: 117 mg/dL — ABNORMAL HIGH (ref 70–99)
Potassium: 4.5 meq/L (ref 3.5–5.1)
Sodium: 139 meq/L (ref 135–145)
Total Bilirubin: 1.1 mg/dL (ref 0.2–1.2)
Total Protein: 6.8 g/dL (ref 6.0–8.3)

## 2023-06-10 LAB — HEMOGLOBIN A1C: Hgb A1c MFr Bld: 6.2 % (ref 4.6–6.5)

## 2023-06-10 LAB — LIPID PANEL
Cholesterol: 108 mg/dL (ref 0–200)
HDL: 41.2 mg/dL (ref >39.00)
LDL Cholesterol: 51 mg/dL (ref 0–99)
NonHDL: 66.52
Total CHOL/HDL Ratio: 3
Triglycerides: 80 mg/dL (ref 0.0–149.0)
VLDL: 16 mg/dL (ref 0.0–40.0)

## 2023-06-10 NOTE — Assessment & Plan Note (Signed)
Chronic Asymptomatic  Continue atorvastatin 40 mg daily, asa 81 mg daily, metoprolol 12.5 mg twice daily 

## 2023-06-10 NOTE — Addendum Note (Signed)
Addended by: Karma Ganja on: 06/10/2023 12:00 PM   Modules accepted: Orders

## 2023-06-10 NOTE — Assessment & Plan Note (Signed)
Chronic Lab Results  Component Value Date   HGBA1C 6.2 12/05/2022   Check a1c Low sugar / carb diet Stressed regular exercise

## 2023-06-10 NOTE — Assessment & Plan Note (Signed)
Chronic Blood pressure well controlled CMP Continue metoprolol 12.5 mg twice daily 

## 2023-06-10 NOTE — Assessment & Plan Note (Signed)
Chronic cough to clear phlegm, chronic SOB, and occ wheeze Continue Advair 250-50 mcg/act 1-2 times a day

## 2023-06-10 NOTE — Assessment & Plan Note (Signed)
Chronic Check lipid panel  Continue atorvastatin 40 mg daily Regular exercise and healthy diet encouraged  

## 2023-06-10 NOTE — Assessment & Plan Note (Signed)
Chronic Stable cmp, CBC

## 2023-06-10 NOTE — Assessment & Plan Note (Signed)
Chronic Controlled, stable Slow stream Continue flomax 0.4 mg daily

## 2023-10-30 DIAGNOSIS — D631 Anemia in chronic kidney disease: Secondary | ICD-10-CM | POA: Diagnosis not present

## 2023-10-30 DIAGNOSIS — E785 Hyperlipidemia, unspecified: Secondary | ICD-10-CM | POA: Diagnosis not present

## 2023-10-30 DIAGNOSIS — I129 Hypertensive chronic kidney disease with stage 1 through stage 4 chronic kidney disease, or unspecified chronic kidney disease: Secondary | ICD-10-CM | POA: Diagnosis not present

## 2023-10-30 DIAGNOSIS — N1832 Chronic kidney disease, stage 3b: Secondary | ICD-10-CM | POA: Diagnosis not present

## 2023-10-30 DIAGNOSIS — N2581 Secondary hyperparathyroidism of renal origin: Secondary | ICD-10-CM | POA: Diagnosis not present

## 2023-12-06 ENCOUNTER — Other Ambulatory Visit: Payer: Self-pay | Admitting: Internal Medicine

## 2023-12-08 ENCOUNTER — Encounter: Payer: Self-pay | Admitting: Internal Medicine

## 2023-12-08 NOTE — Patient Instructions (Addendum)
      Blood work was ordered.       Medications changes include :   ear drops as needed      Return in about 6 months (around 06/10/2024) for follow up.

## 2023-12-08 NOTE — Progress Notes (Signed)
 Subjective:    Patient ID: Preston Boyd, male    DOB: June 16, 1941, 83 y.o.   MRN: [id number]     HPI [place] is here for follow up of his chronic medical problems.  Lost his appetite the end of April- beginning of may.  He tries to eat one meal a day, but some days he will only have 3 packages of nabs.  It may be improving.   HR at 120 after walking dog.  Occ higher than he would think it would be.   Drinks 2-3  16 ox waters and tea daily  Medications and allergies reviewed with patient and updated if appropriate.  Current Outpatient Medications on File Prior to Visit  Medication Sig Dispense Refill   acetaminophen  (TYLENOL ) 500 MG tablet Take 500 mg by mouth every 6 (six) hours as needed for moderate pain.     aspirin  EC 81 MG tablet Take 1 tablet (81 mg total) by mouth daily. Swallow whole. 30 tablet 11   atorvastatin  (LIPITOR) 40 MG tablet TAKE 1 TABLET BY MOUTH DAILY 90 tablet 2   fluticasone -salmeterol (ADVAIR DISKUS) 250-50 MCG/ACT AEPB INHALE ONE PUFF BY MOUTH INTO THE LUNGS TWICE A DAY AS NEEDED FOR SHORTNESS OF BREATH 60 each 5   metoprolol  tartrate (LOPRESSOR ) 25 MG tablet TAKE 1/2 TABLET BY MOUTH TWICE A DAY 90 tablet 3   tamsulosin  (FLOMAX ) 0.4 MG CAPS capsule TAKE 1 CAPSULE BY MOUTH DAILY 90 capsule 3   No current facility-administered medications on file prior to visit.     Review of Systems  Constitutional:  Negative for fever.  Respiratory:  Positive for cough (dry cough - feels like there is phlegm in his throat), shortness of breath (chronic - no change) and wheezing (occ).   Cardiovascular:  Negative for chest pain, palpitations and leg swelling.  Genitourinary:  Positive for difficulty urinating (slow).  Neurological:  Negative for light-headedness and headaches.       Objective:   Vitals:   12/09/23 0833  BP: 122/66  Pulse: (!) 58  Temp: 98 F (36.7 C)  SpO2: 97%   BP Readings from Last 3 Encounters:  12/09/23 122/66  06/10/23 114/70   12/05/22 112/72   Wt Readings from Last 3 Encounters:  12/09/23 248 lb (112.5 kg)  06/10/23 250 lb (113.4 kg)  12/05/22 251 lb (113.9 kg)   Body mass index is 32.72 kg/m.    Physical Exam Constitutional:      General: He is not in acute distress.    Appearance: Normal appearance. He is not ill-appearing.  HENT:     Head: Normocephalic and atraumatic.  Eyes:     Conjunctiva/sclera: Conjunctivae normal.  Cardiovascular:     Rate and Rhythm: Normal rate and regular rhythm.     Heart sounds: Normal heart sounds.  Pulmonary:     Effort: Pulmonary effort is normal. No respiratory distress.     Breath sounds: Normal breath sounds. No wheezing or rales.  Musculoskeletal:     Right lower leg: No edema.     Left lower leg: No edema.  Skin:    General: Skin is warm and dry.     Findings: No rash.  Neurological:     Mental Status: He is alert. Mental status is at baseline.  Psychiatric:        Mood and Affect: Mood normal.        Lab Results  Component Value Date   WBC 7.2 06/10/2023  HGB 16.4 06/10/2023   HCT 49.7 06/10/2023   PLT 180.0 06/10/2023   GLUCOSE 117 (H) 06/10/2023   CHOL 108 06/10/2023   TRIG 80.0 06/10/2023   HDL 41.20 06/10/2023   LDLCALC 51 06/10/2023   ALT 18 06/10/2023   AST 18 06/10/2023   NA 139 06/10/2023   K 4.5 06/10/2023   CL 104 06/10/2023   CREATININE 1.58 (H) 06/10/2023   BUN 23 06/10/2023   CO2 28 06/10/2023   TSH 1.89 11/29/2020   PSA 2.31 03/07/2011   INR 1.24 06/14/2015   HGBA1C 6.2 06/10/2023     Assessment & Plan:    See Problem List for Assessment and Plan of chronic medical problems.

## 2023-12-09 ENCOUNTER — Ambulatory Visit (INDEPENDENT_AMBULATORY_CARE_PROVIDER_SITE_OTHER): Payer: Medicare HMO | Admitting: Internal Medicine

## 2023-12-09 VITALS — BP 122/66 | HR 58 | Temp 98.0°F | Ht 73.0 in | Wt 248.0 lb

## 2023-12-09 DIAGNOSIS — N400 Enlarged prostate without lower urinary tract symptoms: Secondary | ICD-10-CM | POA: Diagnosis not present

## 2023-12-09 DIAGNOSIS — N1832 Chronic kidney disease, stage 3b: Secondary | ICD-10-CM

## 2023-12-09 DIAGNOSIS — I10 Essential (primary) hypertension: Secondary | ICD-10-CM | POA: Diagnosis not present

## 2023-12-09 DIAGNOSIS — E7849 Other hyperlipidemia: Secondary | ICD-10-CM

## 2023-12-09 DIAGNOSIS — R7303 Prediabetes: Secondary | ICD-10-CM | POA: Diagnosis not present

## 2023-12-09 DIAGNOSIS — J449 Chronic obstructive pulmonary disease, unspecified: Secondary | ICD-10-CM | POA: Diagnosis not present

## 2023-12-09 DIAGNOSIS — I2510 Atherosclerotic heart disease of native coronary artery without angina pectoris: Secondary | ICD-10-CM | POA: Diagnosis not present

## 2023-12-09 LAB — CBC WITH DIFFERENTIAL/PLATELET
Basophils Absolute: 0.1 10*3/uL (ref 0.0–0.1)
Basophils Relative: 1.1 % (ref 0.0–3.0)
Eosinophils Absolute: 0.2 10*3/uL (ref 0.0–0.7)
Eosinophils Relative: 3.5 % (ref 0.0–5.0)
HCT: 49.6 % (ref 39.0–52.0)
Hemoglobin: 16.4 g/dL (ref 13.0–17.0)
Lymphocytes Relative: 31.5 % (ref 12.0–46.0)
Lymphs Abs: 2.2 10*3/uL (ref 0.7–4.0)
MCHC: 33 g/dL (ref 30.0–36.0)
MCV: 90.2 fl (ref 78.0–100.0)
Monocytes Absolute: 0.5 10*3/uL (ref 0.1–1.0)
Monocytes Relative: 6.6 % (ref 3.0–12.0)
Neutro Abs: 4 10*3/uL (ref 1.4–7.7)
Neutrophils Relative %: 57.3 % (ref 43.0–77.0)
Platelets: 182 10*3/uL (ref 150.0–400.0)
RBC: 5.5 Mil/uL (ref 4.22–5.81)
RDW: 14.5 % (ref 11.5–15.5)
WBC: 6.9 10*3/uL (ref 4.0–10.5)

## 2023-12-09 LAB — COMPREHENSIVE METABOLIC PANEL WITH GFR
ALT: 15 U/L (ref 0–53)
AST: 16 U/L (ref 0–37)
Albumin: 4.2 g/dL (ref 3.5–5.2)
Alkaline Phosphatase: 62 U/L (ref 39–117)
BUN: 15 mg/dL (ref 6–23)
CO2: 25 meq/L (ref 19–32)
Calcium: 9.7 mg/dL (ref 8.4–10.5)
Chloride: 104 meq/L (ref 96–112)
Creatinine, Ser: 1.69 mg/dL — ABNORMAL HIGH (ref 0.40–1.50)
GFR: 37.52 mL/min — ABNORMAL LOW (ref >60.00)
Glucose, Bld: 115 mg/dL — ABNORMAL HIGH (ref 70–99)
Potassium: 4.4 meq/L (ref 3.5–5.1)
Sodium: 139 meq/L (ref 135–145)
Total Bilirubin: 1.3 mg/dL — ABNORMAL HIGH (ref 0.2–1.2)
Total Protein: 7 g/dL (ref 6.0–8.3)

## 2023-12-09 LAB — HEMOGLOBIN A1C: Hgb A1c MFr Bld: 6.4 % (ref 4.6–6.5)

## 2023-12-09 LAB — LIPID PANEL
Cholesterol: 96 mg/dL (ref 0–200)
HDL: 40.9 mg/dL (ref >39.00)
LDL Cholesterol: 37 mg/dL (ref 0–99)
NonHDL: 54.82
Total CHOL/HDL Ratio: 2
Triglycerides: 87 mg/dL (ref 0.0–149.0)
VLDL: 17.4 mg/dL (ref 0.0–40.0)

## 2023-12-09 MED ORDER — ACETIC ACID 2 % OT SOLN: 2.0000 [drp] | Freq: Two times a day (BID) | OTIC | 5 refills | Status: AC | PRN

## 2023-12-09 NOTE — Assessment & Plan Note (Signed)
 Chronic Lab Results  Component Value Date   HGBA1C 6.2 06/10/2023   Check a1c Low sugar / carb diet Stressed regular exercise

## 2023-12-09 NOTE — Assessment & Plan Note (Signed)
 Chronic Blood pressure well controlled CMP, CBC Continue metoprolol  12.5 mg twice daily

## 2023-12-09 NOTE — Assessment & Plan Note (Signed)
Chronic Stable cmp, CBC

## 2023-12-09 NOTE — Assessment & Plan Note (Signed)
Chronic Check lipid panel, CMP Continue atorvastatin 40 mg daily Regular exercise and healthy diet encouraged

## 2023-12-09 NOTE — Assessment & Plan Note (Addendum)
 Chronic Chronic cough with clear phlegm, chronic SOB, and occ wheeze - no change in symptoms Continue Advair 250-50 mcg/act 1-2 times a day

## 2023-12-09 NOTE — Assessment & Plan Note (Addendum)
 Chronic Asymptomatic  Continue atorvastatin  40 mg daily, asa 81 mg daily, metoprolol  12.5 mg twice daily  EKG -sinus brady at 59 bpm, normal EKG,   compared to last EKG from 2016 sinus bradycardia is new otherwise there have been no changes.

## 2023-12-09 NOTE — Assessment & Plan Note (Signed)
Chronic Controlled, stable Slow stream Continue flomax 0.4 mg daily

## 2023-12-11 ENCOUNTER — Ambulatory Visit: Payer: Self-pay | Admitting: Internal Medicine

## 2024-01-16 ENCOUNTER — Other Ambulatory Visit: Payer: Self-pay

## 2024-01-16 ENCOUNTER — Telehealth: Payer: Self-pay

## 2024-01-16 MED ORDER — FLUTICASONE-SALMETEROL 250-50 MCG/ACT IN AEPB: INHALATION_SPRAY | RESPIRATORY_TRACT | 5 refills | Status: AC

## 2024-01-16 NOTE — Telephone Encounter (Signed)
 Refill sent for patient to [facility].

## 2024-01-16 NOTE — Telephone Encounter (Signed)
 The patient came in to the office requesting a refill of his Fluticasone -Salmeterol (Advair Diskus) 250-50 MCG/ACT AEPB (Generic) to be sent to his [facility] on [address] [place]. The patient reports his pharmacy informed him the prescription had been denied. Patient's last date of service was 12/09/23.

## 2024-01-21 ENCOUNTER — Other Ambulatory Visit: Payer: Self-pay | Admitting: Internal Medicine

## 2024-02-07 ENCOUNTER — Other Ambulatory Visit: Payer: Self-pay | Admitting: Internal Medicine

## 2024-03-02 DIAGNOSIS — H26492 Other secondary cataract, left eye: Secondary | ICD-10-CM | POA: Diagnosis not present

## 2024-03-02 DIAGNOSIS — H0288A Meibomian gland dysfunction right eye, upper and lower eyelids: Secondary | ICD-10-CM | POA: Diagnosis not present

## 2024-03-02 DIAGNOSIS — H43812 Vitreous degeneration, left eye: Secondary | ICD-10-CM | POA: Diagnosis not present

## 2024-03-02 DIAGNOSIS — H0288B Meibomian gland dysfunction left eye, upper and lower eyelids: Secondary | ICD-10-CM | POA: Diagnosis not present

## 2024-03-02 DIAGNOSIS — H04123 Dry eye syndrome of bilateral lacrimal glands: Secondary | ICD-10-CM | POA: Diagnosis not present

## 2024-03-02 DIAGNOSIS — H1045 Other chronic allergic conjunctivitis: Secondary | ICD-10-CM | POA: Diagnosis not present

## 2024-03-02 DIAGNOSIS — Z961 Presence of intraocular lens: Secondary | ICD-10-CM | POA: Diagnosis not present

## 2024-03-06 ENCOUNTER — Other Ambulatory Visit: Payer: Self-pay | Admitting: Internal Medicine

## 2024-06-09 ENCOUNTER — Encounter: Payer: Self-pay | Admitting: Internal Medicine

## 2024-06-09 NOTE — Progress Notes (Unsigned)
 Subjective:    Patient ID: Preston Boyd, male    DOB: [birth year], 83 y.o.   MRN: [id number]     HPI Preston Boyd is here for follow up of his chronic medical problems.  Doing well - no concerns.  Medications and allergies reviewed with patient and updated if appropriate.  Current Outpatient Medications on File Prior to Visit  Medication Sig Dispense Refill   acetaminophen  (TYLENOL ) 500 MG tablet Take 500 mg by mouth every 6 (six) hours as needed for moderate pain.     acetic acid  2 % otic solution Place 2 drops into both ears 2 (two) times daily as needed. 15 mL 5   aspirin  EC 81 MG tablet Take 1 tablet (81 mg total) by mouth daily. Swallow whole. 30 tablet 11   atorvastatin  (LIPITOR) 40 MG tablet TAKE 1 TABLET BY MOUTH DAILY 90 tablet 2   fluticasone -salmeterol (ADVAIR DISKUS) 250-50 MCG/ACT AEPB INHALE ONE PUFF BY MOUTH INTO THE LUNGS TWICE A DAY AS NEEDED FOR SHORTNESS OF BREATH 60 each 5   metoprolol  tartrate (LOPRESSOR ) 25 MG tablet TAKE 1/2 TABLET BY MOUTH TWICE A DAY 90 tablet 3   tamsulosin  (FLOMAX ) 0.4 MG CAPS capsule TAKE 1 CAPSULE BY MOUTH DAILY 90 capsule 3   No current facility-administered medications on file prior to visit.     Review of Systems  Constitutional:  Negative for fever.  Respiratory:  Positive for cough (occ) and shortness of breath (chronic). Negative for wheezing.   Cardiovascular:  Negative for chest pain, palpitations and leg swelling.  Neurological:  Negative for light-headedness and headaches.       Balance is poor - visually related - no falls in last year       Objective:   Vitals:   06/10/24 0842  BP: 120/76  Pulse: (!) 58  Temp: 98.3 F (36.8 C)  SpO2: 97%   BP Readings from Last 3 Encounters:  06/10/24 120/76  12/09/23 122/66  06/10/23 114/70   Wt Readings from Last 3 Encounters:  06/10/24 243 lb (110.2 kg)  12/09/23 248 lb (112.5 kg)  06/10/23 250 lb (113.4 kg)   Body mass index is 32.06 kg/m.    Physical  Exam Constitutional:      General: He is not in acute distress.    Appearance: Normal appearance. He is not ill-appearing.  HENT:     Head: Normocephalic and atraumatic.  Eyes:     Conjunctiva/sclera: Conjunctivae normal.  Cardiovascular:     Rate and Rhythm: Normal rate and regular rhythm.     Heart sounds: Normal heart sounds.  Pulmonary:     Effort: Pulmonary effort is normal. No respiratory distress.     Breath sounds: Normal breath sounds. No wheezing or rales.  Musculoskeletal:     Right lower leg: No edema.     Left lower leg: No edema.  Skin:    General: Skin is warm and dry.     Findings: No rash.  Neurological:     Mental Status: He is alert. Mental status is at baseline.  Psychiatric:        Mood and Affect: Mood normal.        Lab Results  Component Value Date   WBC 6.9 12/09/2023   HGB 16.4 12/09/2023   HCT 49.6 12/09/2023   PLT 182.0 12/09/2023   GLUCOSE 115 (H) 12/09/2023   CHOL 96 12/09/2023   TRIG 87.0 12/09/2023   HDL 40.90 12/09/2023   LDLCALC  37 12/09/2023   ALT 15 12/09/2023   AST 16 12/09/2023   NA 139 12/09/2023   K 4.4 12/09/2023   CL 104 12/09/2023   CREATININE 1.69 (H) 12/09/2023   BUN 15 12/09/2023   CO2 25 12/09/2023   TSH 1.89 11/29/2020   PSA 2.31 03/07/2011   INR 1.24 06/14/2015   HGBA1C 6.4 12/09/2023     Assessment & Plan:    See Problem List for Assessment and Plan of chronic medical problems.    Flu immunization administered today.

## 2024-06-09 NOTE — Patient Instructions (Addendum)
    Flu immunization administered today.      Blood work was ordered.       Medications changes include :   None      Return in about 6 months (around 12/08/2024) for Physical Exam.

## 2024-06-10 ENCOUNTER — Ambulatory Visit: Payer: Self-pay | Admitting: Internal Medicine

## 2024-06-10 ENCOUNTER — Ambulatory Visit (INDEPENDENT_AMBULATORY_CARE_PROVIDER_SITE_OTHER): Admitting: Internal Medicine

## 2024-06-10 VITALS — BP 120/76 | HR 58 | Temp 98.3°F | Ht 73.0 in | Wt 243.0 lb

## 2024-06-10 DIAGNOSIS — I10 Essential (primary) hypertension: Secondary | ICD-10-CM | POA: Diagnosis not present

## 2024-06-10 DIAGNOSIS — Z23 Encounter for immunization: Secondary | ICD-10-CM

## 2024-06-10 DIAGNOSIS — I2510 Atherosclerotic heart disease of native coronary artery without angina pectoris: Secondary | ICD-10-CM | POA: Diagnosis not present

## 2024-06-10 DIAGNOSIS — N400 Enlarged prostate without lower urinary tract symptoms: Secondary | ICD-10-CM

## 2024-06-10 DIAGNOSIS — R7303 Prediabetes: Secondary | ICD-10-CM | POA: Diagnosis not present

## 2024-06-10 DIAGNOSIS — E7800 Pure hypercholesterolemia, unspecified: Secondary | ICD-10-CM | POA: Diagnosis not present

## 2024-06-10 DIAGNOSIS — J449 Chronic obstructive pulmonary disease, unspecified: Secondary | ICD-10-CM

## 2024-06-10 DIAGNOSIS — N1832 Chronic kidney disease, stage 3b: Secondary | ICD-10-CM

## 2024-06-10 LAB — MICROALBUMIN / CREATININE URINE RATIO
Creatinine,U: 96.2 mg/dL
Microalb Creat Ratio: UNDETERMINED mg/g (ref 0.0–30.0)
Microalb, Ur: <0.7 mg/dL

## 2024-06-10 LAB — COMPREHENSIVE METABOLIC PANEL WITH GFR
ALT: 19 U/L (ref 0–53)
AST: 18 U/L (ref 0–37)
Albumin: 3.9 g/dL (ref 3.5–5.2)
Alkaline Phosphatase: 61 U/L (ref 39–117)
BUN: 22 mg/dL (ref 6–23)
CO2: 23 meq/L (ref 19–32)
Calcium: 9.1 mg/dL (ref 8.4–10.5)
Chloride: 107 meq/L (ref 96–112)
Creatinine, Ser: 1.73 mg/dL — ABNORMAL HIGH (ref 0.40–1.50)
GFR: 36.35 mL/min — ABNORMAL LOW (ref >60.00)
Glucose, Bld: 104 mg/dL — ABNORMAL HIGH (ref 70–99)
Potassium: 4.1 meq/L (ref 3.5–5.1)
Sodium: 139 meq/L (ref 135–145)
Total Bilirubin: 0.9 mg/dL (ref 0.2–1.2)
Total Protein: 6.5 g/dL (ref 6.0–8.3)

## 2024-06-10 LAB — LIPID PANEL
Cholesterol: 98 mg/dL (ref 0–200)
HDL: 43.4 mg/dL (ref >39.00)
LDL Cholesterol: 40 mg/dL (ref 0–99)
NonHDL: 54.37
Total CHOL/HDL Ratio: 2
Triglycerides: 72 mg/dL (ref 0.0–149.0)
VLDL: 14.4 mg/dL (ref 0.0–40.0)

## 2024-06-10 LAB — CBC WITH DIFFERENTIAL/PLATELET
Basophils Absolute: 0.1 K/uL (ref 0.0–0.1)
Basophils Relative: 1 % (ref 0.0–3.0)
Eosinophils Absolute: 0.3 K/uL (ref 0.0–0.7)
Eosinophils Relative: 3.4 % (ref 0.0–5.0)
HCT: 47.4 % (ref 39.0–52.0)
Hemoglobin: 15.8 g/dL (ref 13.0–17.0)
Lymphocytes Relative: 27.4 % (ref 12.0–46.0)
Lymphs Abs: 2 K/uL (ref 0.7–4.0)
MCHC: 33.4 g/dL (ref 30.0–36.0)
MCV: 92.6 fl (ref 78.0–100.0)
Monocytes Absolute: 0.5 K/uL (ref 0.1–1.0)
Monocytes Relative: 7.4 % (ref 3.0–12.0)
Neutro Abs: 4.5 K/uL (ref 1.4–7.7)
Neutrophils Relative %: 60.8 % (ref 43.0–77.0)
Platelets: 146 K/uL — ABNORMAL LOW (ref 150.0–400.0)
RBC: 5.12 Mil/uL (ref 4.22–5.81)
RDW: 14.8 % (ref 11.5–15.5)
WBC: 7.4 K/uL (ref 4.0–10.5)

## 2024-06-10 LAB — VITAMIN D 25 HYDROXY (VIT D DEFICIENCY, FRACTURES): VITD: 45.82 ng/mL (ref 30.00–100.00)

## 2024-06-10 LAB — URINALYSIS, ROUTINE W REFLEX MICROSCOPIC
Bilirubin Urine: NEGATIVE
Hgb urine dipstick: NEGATIVE
Ketones, ur: NEGATIVE
Leukocytes,Ua: NEGATIVE
Nitrite: NEGATIVE
RBC / HPF: NONE SEEN (ref 0–?)
Specific Gravity, Urine: 1.015 (ref 1.000–1.030)
Total Protein, Urine: NEGATIVE
Urine Glucose: NEGATIVE
Urobilinogen, UA: 1 (ref 0.0–1.0)
WBC, UA: NONE SEEN (ref 0–?)
pH: 6.5 (ref 5.0–8.0)

## 2024-06-10 LAB — HEMOGLOBIN A1C: Hgb A1c MFr Bld: 5.7 % (ref 4.6–6.5)

## 2024-06-10 NOTE — Assessment & Plan Note (Signed)
 Chronic Chronic cough with clear phlegm, chronic SOB, and occ wheeze - no change in symptoms Continue Advair 250-50 mcg/act 1-2 times a day

## 2024-06-10 NOTE — Assessment & Plan Note (Signed)
Chronic Controlled, stable Slow stream Continue flomax 0.4 mg daily

## 2024-06-10 NOTE — Assessment & Plan Note (Signed)
Chronic Check lipid panel, CMP Continue atorvastatin 40 mg daily Regular exercise and healthy diet encouraged

## 2024-06-10 NOTE — Assessment & Plan Note (Signed)
 Chronic Stage 3a - Stable cmp, CBC, vitamin d level, UA, urine microalbumin/cr ratio

## 2024-06-10 NOTE — Assessment & Plan Note (Signed)
 Chronic Blood pressure well controlled CMP, CBC Continue metoprolol  12.5 mg twice daily

## 2024-06-10 NOTE — Assessment & Plan Note (Signed)
 Chronic Lab Results  Component Value Date   HGBA1C 6.4 12/09/2023   Check a1c Low sugar / carb diet Stressed regular exercise

## 2024-06-10 NOTE — Assessment & Plan Note (Signed)
 Chronic Asymptomatic  Cbc, cmp, lipid Continue atorvastatin  40 mg daily, asa 81 mg daily, metoprolol  12.5 mg twice daily

## 2024-06-24 DIAGNOSIS — N1832 Chronic kidney disease, stage 3b: Secondary | ICD-10-CM | POA: Diagnosis not present

## 2024-06-29 DIAGNOSIS — I129 Hypertensive chronic kidney disease with stage 1 through stage 4 chronic kidney disease, or unspecified chronic kidney disease: Secondary | ICD-10-CM | POA: Diagnosis not present

## 2024-06-29 DIAGNOSIS — E785 Hyperlipidemia, unspecified: Secondary | ICD-10-CM | POA: Diagnosis not present

## 2024-06-29 DIAGNOSIS — N1832 Chronic kidney disease, stage 3b: Secondary | ICD-10-CM | POA: Diagnosis not present

## 2024-06-29 DIAGNOSIS — D631 Anemia in chronic kidney disease: Secondary | ICD-10-CM | POA: Diagnosis not present

## 2024-06-29 DIAGNOSIS — N2581 Secondary hyperparathyroidism of renal origin: Secondary | ICD-10-CM | POA: Diagnosis not present

## 2024-12-07 ENCOUNTER — Encounter: Admitting: Internal Medicine
